# Patient Record
Sex: Male | Born: 1940
Health system: Southern US, Community
[De-identification: ages and names within clinical notes are randomized; demographics above are authoritative.]

## PROBLEM LIST (undated history)

## (undated) DIAGNOSIS — D649 Anemia, unspecified: Secondary | ICD-10-CM

## (undated) DIAGNOSIS — Z8679 Personal history of other diseases of the circulatory system: Secondary | ICD-10-CM

## (undated) DIAGNOSIS — E119 Type 2 diabetes mellitus without complications: Secondary | ICD-10-CM

## (undated) DIAGNOSIS — Z8719 Personal history of other diseases of the digestive system: Secondary | ICD-10-CM

## (undated) DIAGNOSIS — I10 Essential (primary) hypertension: Secondary | ICD-10-CM

## (undated) HISTORY — PX: TESTICLE SURGERY: SHX794

## (undated) HISTORY — PX: TONSILLECTOMY: SUR1361

---

## 2002-05-28 ENCOUNTER — Emergency Department (HOSPITAL_COMMUNITY): Admission: EM | Admit: 2002-05-28 | Discharge: 2002-05-28 | Payer: Self-pay | Admitting: *Deleted

## 2002-05-28 ENCOUNTER — Encounter: Payer: Self-pay | Admitting: *Deleted

## 2004-01-24 ENCOUNTER — Emergency Department (HOSPITAL_COMMUNITY): Admission: EM | Admit: 2004-01-24 | Discharge: 2004-01-25 | Payer: Self-pay | Admitting: Emergency Medicine

## 2009-02-04 ENCOUNTER — Emergency Department (HOSPITAL_COMMUNITY): Admission: EM | Admit: 2009-02-04 | Discharge: 2009-02-04 | Payer: Self-pay | Admitting: Emergency Medicine

## 2011-03-05 DIAGNOSIS — I10 Essential (primary) hypertension: Secondary | ICD-10-CM | POA: Diagnosis not present

## 2011-03-05 DIAGNOSIS — I679 Cerebrovascular disease, unspecified: Secondary | ICD-10-CM | POA: Diagnosis not present

## 2011-03-05 DIAGNOSIS — R5381 Other malaise: Secondary | ICD-10-CM | POA: Diagnosis not present

## 2011-05-05 DIAGNOSIS — R5383 Other fatigue: Secondary | ICD-10-CM | POA: Diagnosis not present

## 2011-05-05 DIAGNOSIS — I679 Cerebrovascular disease, unspecified: Secondary | ICD-10-CM | POA: Diagnosis not present

## 2011-05-05 DIAGNOSIS — I10 Essential (primary) hypertension: Secondary | ICD-10-CM | POA: Diagnosis not present

## 2011-05-05 DIAGNOSIS — R5381 Other malaise: Secondary | ICD-10-CM | POA: Diagnosis not present

## 2011-07-29 DIAGNOSIS — R5383 Other fatigue: Secondary | ICD-10-CM | POA: Diagnosis not present

## 2011-07-29 DIAGNOSIS — I679 Cerebrovascular disease, unspecified: Secondary | ICD-10-CM | POA: Diagnosis not present

## 2011-07-29 DIAGNOSIS — R5381 Other malaise: Secondary | ICD-10-CM | POA: Diagnosis not present

## 2011-07-29 DIAGNOSIS — I10 Essential (primary) hypertension: Secondary | ICD-10-CM | POA: Diagnosis not present

## 2011-12-29 DIAGNOSIS — S335XXA Sprain of ligaments of lumbar spine, initial encounter: Secondary | ICD-10-CM | POA: Diagnosis not present

## 2011-12-29 DIAGNOSIS — I10 Essential (primary) hypertension: Secondary | ICD-10-CM | POA: Diagnosis not present

## 2011-12-29 DIAGNOSIS — I679 Cerebrovascular disease, unspecified: Secondary | ICD-10-CM | POA: Diagnosis not present

## 2012-07-26 DIAGNOSIS — I679 Cerebrovascular disease, unspecified: Secondary | ICD-10-CM | POA: Diagnosis not present

## 2012-07-26 DIAGNOSIS — I10 Essential (primary) hypertension: Secondary | ICD-10-CM | POA: Diagnosis not present

## 2012-11-10 DIAGNOSIS — I679 Cerebrovascular disease, unspecified: Secondary | ICD-10-CM | POA: Diagnosis not present

## 2012-11-10 DIAGNOSIS — I10 Essential (primary) hypertension: Secondary | ICD-10-CM | POA: Diagnosis not present

## 2012-11-29 DIAGNOSIS — I10 Essential (primary) hypertension: Secondary | ICD-10-CM | POA: Diagnosis not present

## 2012-12-04 ENCOUNTER — Emergency Department (HOSPITAL_COMMUNITY): Payer: Medicare Other

## 2012-12-04 ENCOUNTER — Emergency Department (HOSPITAL_COMMUNITY)
Admission: EM | Admit: 2012-12-04 | Discharge: 2012-12-04 | Disposition: A | Discharge status: Home / Self Care | Payer: Medicare Other | Attending: Emergency Medicine | Admitting: Emergency Medicine

## 2012-12-04 ENCOUNTER — Encounter (HOSPITAL_COMMUNITY): Payer: Self-pay | Admitting: Emergency Medicine

## 2012-12-04 DIAGNOSIS — Z79899 Other long term (current) drug therapy: Secondary | ICD-10-CM | POA: Insufficient documentation

## 2012-12-04 DIAGNOSIS — T6091XA Toxic effect of unspecified pesticide, accidental (unintentional), initial encounter: Secondary | ICD-10-CM | POA: Diagnosis not present

## 2012-12-04 DIAGNOSIS — R0602 Shortness of breath: Secondary | ICD-10-CM | POA: Diagnosis not present

## 2012-12-04 DIAGNOSIS — J9801 Acute bronchospasm: Secondary | ICD-10-CM | POA: Insufficient documentation

## 2012-12-04 DIAGNOSIS — Z7982 Long term (current) use of aspirin: Secondary | ICD-10-CM | POA: Insufficient documentation

## 2012-12-04 DIAGNOSIS — T5994XA Toxic effect of unspecified gases, fumes and vapors, undetermined, initial encounter: Secondary | ICD-10-CM

## 2012-12-04 DIAGNOSIS — Y9389 Activity, other specified: Secondary | ICD-10-CM | POA: Insufficient documentation

## 2012-12-04 DIAGNOSIS — I10 Essential (primary) hypertension: Secondary | ICD-10-CM | POA: Insufficient documentation

## 2012-12-04 DIAGNOSIS — R61 Generalized hyperhidrosis: Secondary | ICD-10-CM | POA: Diagnosis not present

## 2012-12-04 DIAGNOSIS — T59891A Toxic effect of other specified gases, fumes and vapors, accidental (unintentional), initial encounter: Secondary | ICD-10-CM | POA: Diagnosis not present

## 2012-12-04 DIAGNOSIS — Y92009 Unspecified place in unspecified non-institutional (private) residence as the place of occurrence of the external cause: Secondary | ICD-10-CM | POA: Insufficient documentation

## 2012-12-04 HISTORY — DX: Essential (primary) hypertension: I10

## 2012-12-04 MED ORDER — ALBUTEROL SULFATE HFA 108 (90 BASE) MCG/ACT IN AERS: 2.0000 | INHALATION_SPRAY | RESPIRATORY_TRACT | Status: DC | PRN

## 2012-12-04 MED ORDER — ALBUTEROL SULFATE (5 MG/ML) 0.5% IN NEBU
2.5000 mg | INHALATION_SOLUTION | Freq: Once | RESPIRATORY_TRACT | Status: AC
  Administered 2012-12-04: 2.5 mg via RESPIRATORY_TRACT
  Filled 2012-12-04: qty 0.5

## 2012-12-04 MED ORDER — ALBUTEROL SULFATE HFA 108 (90 BASE) MCG/ACT IN AERS
INHALATION_SPRAY | RESPIRATORY_TRACT | Status: AC
  Filled 2012-12-04: qty 6.7

## 2012-12-04 MED ORDER — AEROCHAMBER Z-STAT PLUS/MEDIUM MISC: 1.0000 | Freq: Once | Status: DC

## 2012-12-04 MED ORDER — IPRATROPIUM BROMIDE 0.02 % IN SOLN
0.5000 mg | Freq: Once | RESPIRATORY_TRACT | Status: AC
  Administered 2012-12-04: 0.5 mg via RESPIRATORY_TRACT
  Filled 2012-12-04: qty 2.5

## 2012-12-04 NOTE — ED Provider Notes (Signed)
CSN: [id number]     Arrival date & time 12/04/12  1426 History  This chart was scribed for Ward Givens, MD by Leone Payor, ED Scribe. This patient was seen in room APA02/APA02 and the patient's care was started 2:33 PM.    Chief Complaint  Patient presents with  . Shortness of Breath    The history is provided by the patient. No language interpreter was used.    HPI Comments: Andrew Humphrey is a 72 y.o. male brought in by ambulance, who presents to the Emergency Department complaining of gradually improving SOB that began PTA. Pt states he set off 4 bug defoggers in his home around 11 am. He returned after 2 hours and was inside about 30-40 minutes before he began to have severe SOB. Pt states he was diaphoretic as well. Pt was given 3 albuterol treatments, 125 solumedrol, and 50 mg benadryl by EMS. He denies cough, dizziness, lightheadedness. He states he was so SOB he couldn't cough. EMS report they had to get out of the trailer shortly after they got there because the fumes were affecting them also. The only medication he takes is metoprolol. He has never used inhalers in the past.   PCP PA Noralyn Pick at [facility] in [place].   Past Medical History  Diagnosis Date  . Hypertension    History reviewed. No pertinent past surgical history. History reviewed. No pertinent family history. History  Substance Use Topics  . Smoking status: Never Smoker   . Smokeless tobacco: Not on file  . 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 Negative for cough.   Neurological: Negative for dizziness and light-headedness.  All other systems reviewed and are negative.    Allergies  Review of patient's allergies indicates no known allergies.  Home Medications   Current Outpatient Rx  Name  Route  Sig  Dispense  Refill  . aspirin EC 81 MG tablet   Oral   Take 81 mg by mouth  daily.         Marland Kitchen lisinopril-hydrochlorothiazide (PRINZIDE,ZESTORETIC) 10-12.5 MG per tablet   Oral   Take 1 tablet by mouth daily.         . metoprolol succinate (TOPROL-XL) 50 MG 24 hr tablet   Oral   Take 50 mg by mouth daily.         . Omega-3 Fatty Acids (FISH OIL) 1000 MG CAPS   Oral   Take 1,000 mg by mouth daily.          BP 151/87  Pulse 96  Temp(Src) 98.1 F (36.7 C) (Oral)  Resp 18  Ht 5\' 7"  (1.702 m)  Wt 202 lb (91.627 kg)  BMI 31.63 kg/m2  SpO2 97%  Vital signs normal   Physical Exam  Nursing note and vitals reviewed. Constitutional: He is oriented to person, place, and time. He appears well-developed and well-nourished.  Non-toxic appearance. He does not appear ill. No distress.  HENT:  Head: Normocephalic and atraumatic.  Right Ear: External ear normal.  Left Ear: External ear normal.  Nose: Nose normal. No mucosal edema or rhinorrhea.  Mouth/Throat: Oropharynx is clear and moist and mucous membranes are normal. No dental abscesses or uvula swelling.  Eyes: Conjunctivae and EOM are normal. Pupils are equal, round, and reactive to light.  Neck: Normal range of motion and full passive range of motion without  pain. Neck supple.  Cardiovascular: Normal rate, regular rhythm and normal heart sounds.  Exam reveals no gallop and no friction rub.   No murmur heard. Pulmonary/Chest: Effort normal. No respiratory distress. He has wheezes ( Expiratory ). He has no rhonchi. He has no rales. He exhibits no tenderness and no crepitus.  Mild retractions and mild abdominal breathing.   Abdominal: Soft. Normal appearance and bowel sounds are normal. He exhibits no distension. There is no tenderness. There is no rebound and no guarding.  Musculoskeletal: Normal range of motion. He exhibits no edema and no tenderness.  Moves all extremities well.   Neurological: He is alert and oriented to person, place, and time. He has normal strength. No cranial nerve deficit.   Skin: Skin is warm, dry and intact. No rash noted. No erythema. No pallor.  Psychiatric: He has a normal mood and affect. His speech is normal and behavior is normal. His mood appears not anxious.    ED Course  Procedures  Medications  albuterol (PROVENTIL HFA;VENTOLIN HFA) 108 (90 BASE) MCG/ACT inhaler 2 puff (not administered)  aerochamber Z-Stat Plus/medium 1 each (not administered)  albuterol (PROVENTIL HFA;VENTOLIN HFA) 108 (90 BASE) MCG/ACT inhaler (not administered)  albuterol (PROVENTIL) (5 MG/ML) 0.5% nebulizer solution 2.5 mg (2.5 mg Nebulization Given 12/04/12 1512)  ipratropium (ATROVENT) nebulizer solution 0.5 mg (0.5 mg Nebulization Given 12/04/12 1512)     DIAGNOSTIC STUDIES: Oxygen Saturation is 98% on RA, normal by my interpretation.    COORDINATION OF CARE: 2:35 PM Discussed treatment plan with pt at bedside and pt agreed to plan.    3:50 PM Upon recheck, pt's lungs sound clear.   Pt ambulated by nursing staff and had consistent pulse ox of 96-97 % on RA without getting SOB.   Imaging Review Dg Chest 2 View  12/04/2012   CLINICAL DATA:  Shortness of breath.  EXAM: CHEST  2 VIEW  COMPARISON:  No priors.  FINDINGS: Lung volumes are normal. No consolidative airspace disease. No pleural effusions. No pneumothorax. No pulmonary nodule or mass noted. Pulmonary vasculature and the cardiomediastinal silhouette are within normal limits.  IMPRESSION: 1.  No radiographic evidence of acute cardiopulmonary disease.   Electronically Signed   By: Trudie Reed M.D.   On: 12/04/2012 15:14      MDM  patient had toxic exposure to fall getting chemicals. He has improved with nebulizers and getting out of that environment. He has no evidence of a chemical pneumonia on his chest x-ray and his breathing improved.    1. Toxic inhalation injury, initial encounter   2. Bronchospasm      Plan discharge   Devoria Albe, MD, FACEP   I personally performed the services described  in this documentation, which was scribed in my presence. The recorded information has been reviewed and considered.  Devoria Albe, MD, Armando Gang   Ward Givens, MD 12/04/12 [phone]

## 2012-12-04 NOTE — ED Notes (Addendum)
Per EMS, pt set off bug defoggers this afternoon inside of pt trailer. Pt came back to house two hours later and sob began. Per EMS, pt was having respiratory distress. Pt received 50 mg of benadryl,3 albuterol treatments,125 mg solu-medrol en route. Pt o2 sat 98% on room air at time of arrival to ED. Mild dyspnea noted,pt voice hoarse. Pt alert and oriented. nad noted. Airway patent. Mm moist intact.

## 2013-03-28 DIAGNOSIS — I10 Essential (primary) hypertension: Secondary | ICD-10-CM | POA: Diagnosis not present

## 2013-05-11 DIAGNOSIS — J019 Acute sinusitis, unspecified: Secondary | ICD-10-CM | POA: Diagnosis not present

## 2013-05-11 DIAGNOSIS — R197 Diarrhea, unspecified: Secondary | ICD-10-CM | POA: Diagnosis not present

## 2013-05-11 DIAGNOSIS — R059 Cough, unspecified: Secondary | ICD-10-CM | POA: Diagnosis not present

## 2013-05-11 DIAGNOSIS — R05 Cough: Secondary | ICD-10-CM | POA: Diagnosis not present

## 2013-07-26 DIAGNOSIS — I10 Essential (primary) hypertension: Secondary | ICD-10-CM | POA: Diagnosis not present

## 2013-07-26 DIAGNOSIS — I679 Cerebrovascular disease, unspecified: Secondary | ICD-10-CM | POA: Diagnosis not present

## 2013-11-23 DIAGNOSIS — I10 Essential (primary) hypertension: Secondary | ICD-10-CM | POA: Diagnosis not present

## 2013-11-23 DIAGNOSIS — I679 Cerebrovascular disease, unspecified: Secondary | ICD-10-CM | POA: Diagnosis not present

## 2014-01-30 DIAGNOSIS — Z1389 Encounter for screening for other disorder: Secondary | ICD-10-CM | POA: Diagnosis not present

## 2014-01-30 DIAGNOSIS — I10 Essential (primary) hypertension: Secondary | ICD-10-CM | POA: Diagnosis not present

## 2014-01-30 DIAGNOSIS — I679 Cerebrovascular disease, unspecified: Secondary | ICD-10-CM | POA: Diagnosis not present

## 2014-05-02 DIAGNOSIS — I10 Essential (primary) hypertension: Secondary | ICD-10-CM | POA: Diagnosis not present

## 2014-05-02 DIAGNOSIS — I679 Cerebrovascular disease, unspecified: Secondary | ICD-10-CM | POA: Diagnosis not present

## 2014-10-15 ENCOUNTER — Emergency Department (HOSPITAL_COMMUNITY): Payer: Commercial Managed Care - HMO

## 2014-10-15 ENCOUNTER — Emergency Department (HOSPITAL_COMMUNITY)
Admission: EM | Admit: 2014-10-15 | Discharge: 2014-10-15 | Disposition: A | Discharge status: Home / Self Care | Payer: Commercial Managed Care - HMO | Attending: Emergency Medicine | Admitting: Emergency Medicine

## 2014-10-15 ENCOUNTER — Encounter (HOSPITAL_COMMUNITY): Payer: Self-pay | Admitting: Emergency Medicine

## 2014-10-15 DIAGNOSIS — Z7982 Long term (current) use of aspirin: Secondary | ICD-10-CM | POA: Diagnosis not present

## 2014-10-15 DIAGNOSIS — I10 Essential (primary) hypertension: Secondary | ICD-10-CM | POA: Insufficient documentation

## 2014-10-15 DIAGNOSIS — R209 Unspecified disturbances of skin sensation: Secondary | ICD-10-CM | POA: Insufficient documentation

## 2014-10-15 DIAGNOSIS — Z79899 Other long term (current) drug therapy: Secondary | ICD-10-CM | POA: Diagnosis not present

## 2014-10-15 DIAGNOSIS — J302 Other seasonal allergic rhinitis: Secondary | ICD-10-CM | POA: Diagnosis not present

## 2014-10-15 DIAGNOSIS — R202 Paresthesia of skin: Secondary | ICD-10-CM | POA: Diagnosis not present

## 2014-10-15 DIAGNOSIS — R0981 Nasal congestion: Secondary | ICD-10-CM | POA: Diagnosis not present

## 2014-10-15 DIAGNOSIS — R0602 Shortness of breath: Secondary | ICD-10-CM | POA: Diagnosis not present

## 2014-10-15 DIAGNOSIS — J309 Allergic rhinitis, unspecified: Secondary | ICD-10-CM | POA: Diagnosis not present

## 2014-10-15 LAB — CBC WITH DIFFERENTIAL/PLATELET
BASOS PCT: 0 % (ref 0–1)
Basophils Absolute: 0 10*3/uL (ref 0.0–0.1)
EOS ABS: 0.2 10*3/uL (ref 0.0–0.7)
Eosinophils Relative: 1 % (ref 0–5)
HCT: 40.7 % (ref 39.0–52.0)
HEMOGLOBIN: 14 g/dL (ref 13.0–17.0)
Lymphocytes Relative: 21 % (ref 12–46)
Lymphs Abs: 2.3 10*3/uL (ref 0.7–4.0)
MCH: 32.2 pg (ref 26.0–34.0)
MCHC: 34.4 g/dL (ref 30.0–36.0)
MCV: 93.6 fL (ref 78.0–100.0)
Monocytes Absolute: 1.1 10*3/uL — ABNORMAL HIGH (ref 0.1–1.0)
Monocytes Relative: 10 % (ref 3–12)
NEUTROS PCT: 68 % (ref 43–77)
Neutro Abs: 7.6 10*3/uL (ref 1.7–7.7)
PLATELETS: 179 10*3/uL (ref 150–400)
RBC: 4.35 MIL/uL (ref 4.22–5.81)
RDW: 16.6 % — ABNORMAL HIGH (ref 11.5–15.5)
WBC: 11.2 10*3/uL — AB (ref 4.0–10.5)

## 2014-10-15 LAB — BASIC METABOLIC PANEL
Anion gap: 10 (ref 5–15)
BUN: 12 mg/dL (ref 6–20)
CO2: 21 mmol/L — ABNORMAL LOW (ref 22–32)
Calcium: 9.2 mg/dL (ref 8.9–10.3)
Chloride: 107 mmol/L (ref 101–111)
Creatinine, Ser: 0.8 mg/dL (ref 0.61–1.24)
GFR calc Af Amer: >60 mL/min (ref >60)
GFR calc non Af Amer: >60 mL/min (ref >60)
Glucose, Bld: 110 mg/dL — ABNORMAL HIGH (ref 65–99)
Potassium: 3.8 mmol/L (ref 3.5–5.1)
Sodium: 138 mmol/L (ref 135–145)

## 2014-10-15 MED ORDER — LEVOCETIRIZINE DIHYDROCHLORIDE 5 MG PO TABS: 5.0000 mg | ORAL_TABLET | Freq: Every evening | ORAL | Status: DC

## 2014-10-15 MED ORDER — FLUTICASONE PROPIONATE 50 MCG/ACT NA SUSP: 2.0000 | Freq: Every day | NASAL | Status: DC

## 2014-10-15 MED ORDER — CHLORPHEN-PE-ACETAMINOPHEN 2-5-325 MG PO TABS: 1.0000 | ORAL_TABLET | Freq: Two times a day (BID) | ORAL | Status: DC | PRN

## 2014-10-15 NOTE — ED Provider Notes (Signed)
CSN: [id number]     Arrival date & time 10/15/14  1613 History   First MD Initiated Contact with Patient 10/15/14 1649     Chief Complaint  Patient presents with  . Dysphagia  . Shortness of Breath     (Consider location/radiation/quality/duration/timing/severity/associated sxs/prior Treatment) Patient is a 74 y.o. male presenting with pharyngitis. The history is provided by the patient.  Sore Throat This is a new problem. The current episode started more than 2 days ago. The problem occurs constantly. Progression since onset: waxing and waning. Associated symptoms include shortness of breath (feels like throat is tight, increased nasal drainage, mild cough). Pertinent negatives include no chest pain. Exacerbated by: mowing lawn. Nothing relieves the symptoms. He has tried nothing for the symptoms. The treatment provided no relief.    Past Medical History  Diagnosis Date  . Hypertension    History reviewed. No pertinent past surgical history. History reviewed. No pertinent family history. Social History  Substance Use Topics  . Smoking status: Never Smoker   . Smokeless tobacco: None  . Alcohol Use: Yes     Comment: occasional beer    Review of Systems  Respiratory: Positive for shortness of breath (feels like throat is tight, increased nasal drainage, mild cough).   Cardiovascular: Negative for chest pain.  All other systems reviewed and are negative.     Allergies  Review of patient's allergies indicates no known allergies.  Home Medications   Prior to Admission medications   Medication Sig Start Date End Date Taking? Authorizing Provider  albuterol (PROVENTIL HFA;VENTOLIN HFA) 108 (90 BASE) MCG/ACT inhaler Inhale 1-2 puffs into the lungs every 6 (six) hours as needed for wheezing or shortness of breath.   Yes Historical Provider, MD  aspirin EC 81 MG tablet Take 81 mg by mouth every morning.    Yes Historical Provider, MD  metoprolol succinate (TOPROL-XL) 100 MG 24  hr tablet Take 100 mg by mouth every morning. 10/04/14  Yes Historical Provider, MD  Chlorphen-Phenyleph-APAP (CORICIDIN D COLD/FLU/SINUS) 2-5-325 MG TABS Take 1 tablet by mouth 2 (two) times daily as needed (congestion). 10/15/14   Leo Grosser, MD  fluticasone [facility]) 50 MCG/ACT nasal spray Place 2 sprays into both nostrils daily. 10/15/14   Leo Grosser, MD  levocetirizine (XYZAL) 5 MG tablet Take 1 tablet (5 mg total) by mouth every evening. 10/15/14   Leo Grosser, MD   BP 190/108 mmHg  Pulse 69  Temp(Src) 98.1 F (36.7 C) (Oral)  Resp 15  Ht 5\' 7"  (1.702 m)  Wt 190 lb (86.183 kg)  BMI 29.75 kg/m2  SpO2 99% Physical Exam  Constitutional: He is oriented to person, place, and time. He appears well-developed and well-nourished. No distress.  HENT:  Head: Normocephalic and atraumatic.  Nose: Mucosal edema present. No rhinorrhea. Right sinus exhibits no maxillary sinus tenderness and no frontal sinus tenderness. Left sinus exhibits no maxillary sinus tenderness and no frontal sinus tenderness.  Eyes: Conjunctivae are normal.  Neck: Neck supple. No tracheal deviation present.  Cardiovascular: Normal rate and regular rhythm.   Pulmonary/Chest: Effort normal. No respiratory distress.  Abdominal: Soft. He exhibits no distension.  Neurological: He is alert and oriented to person, place, and time. He has normal strength. No cranial nerve deficit or sensory deficit. Coordination normal. GCS eye subscore is 4. GCS verbal subscore is 5. GCS motor subscore is 6.  Skin: Skin is warm and dry.  Psychiatric: He has a normal mood and affect.    ED Course  Procedures (including critical care time) Labs Review Labs Reviewed  BASIC METABOLIC PANEL - Abnormal; Notable for the following:    CO2 21 (*)    Glucose, Bld 110 (*)    All other components within normal limits  CBC WITH DIFFERENTIAL/PLATELET - Abnormal; Notable for the following:    WBC 11.2 (*)    RDW 16.6 (*)    Monocytes Absolute 1.1  (*)    All other components within normal limits    Imaging Review Dg Chest 2 View  10/15/2014   CLINICAL DATA:  Shortness of breath for 7 days. Tingling on the right side of the face and in the arms.  EXAM: CHEST  2 VIEW  COMPARISON:  PA and lateral chest 12/04/2012.  FINDINGS: The lungs are clear. Heart size is normal. No pneumothorax or pleural effusion. No focal bony abnormality. Mild thoracic spondylosis noted  IMPRESSION: No acute disease.   Electronically Signed   By: Inge Rise M.D.   On: 10/15/2014 18:32   Ct Head Wo Contrast  10/15/2014   CLINICAL DATA:  Difficulty swallowing for 1 week, left-sided facial paresthesias  EXAM: CT HEAD WITHOUT CONTRAST  TECHNIQUE: Contiguous axial images were obtained from the base of the skull through the vertex without intravenous contrast.  COMPARISON:  12/05/2009  FINDINGS: Bony calvarium is intact. No findings to suggest acute hemorrhage, acute infarction or space-occupying mass lesion is identified. Chronic opacification of the right maxillary antrum is noted.  IMPRESSION: No acute abnormality noted.   Electronically Signed   By: Inez Catalina M.D.   On: 10/15/2014 18:45   I have personally reviewed and evaluated these images and lab results as part of my medical decision-making.   EKG Interpretation None      MDM   Final diagnoses:  Nasal congestion  Seasonal allergies    74 year old male with past history of CVA presents with left-sided facial tingling, ongoing issues with congestion and some shortness of breath with activity. He notices this mostly when he is mowing the lawn. He has had some mild a productive cough. He is hypertensive on arrival and with his facial paresthesia and history of stroke CT was ordered to evaluate for ischemic insult although I find this unlikely at this time. No fevers or other signs of acute sinusitis. Will treat with supportive care measures for possible environmental allergies.   Leo Grosser,  MD 10/16/14 1311

## 2014-10-15 NOTE — ED Notes (Signed)
Pt alert & oriented x4, stable gait. Patient given discharge instructions, paperwork & prescription(s). Patient  instructed to stop at the registration desk to finish any additional paperwork. Patient verbalized understanding. Pt left department w/ no further questions. 

## 2014-10-15 NOTE — ED Notes (Signed)
Having difficulty swallowing and SOB for last 7 days.  Tingling to left side of face per family.  Pt is very playful.

## 2014-10-15 NOTE — Discharge Instructions (Signed)

## 2015-01-09 DIAGNOSIS — I679 Cerebrovascular disease, unspecified: Secondary | ICD-10-CM | POA: Diagnosis not present

## 2015-01-09 DIAGNOSIS — I10 Essential (primary) hypertension: Secondary | ICD-10-CM | POA: Diagnosis not present

## 2015-05-02 DIAGNOSIS — I10 Essential (primary) hypertension: Secondary | ICD-10-CM | POA: Diagnosis not present

## 2015-05-02 DIAGNOSIS — I679 Cerebrovascular disease, unspecified: Secondary | ICD-10-CM | POA: Diagnosis not present

## 2015-09-26 DIAGNOSIS — R1310 Dysphagia, unspecified: Secondary | ICD-10-CM | POA: Diagnosis not present

## 2015-09-26 DIAGNOSIS — R739 Hyperglycemia, unspecified: Secondary | ICD-10-CM | POA: Diagnosis not present

## 2015-09-26 DIAGNOSIS — K219 Gastro-esophageal reflux disease without esophagitis: Secondary | ICD-10-CM | POA: Diagnosis not present

## 2015-09-26 DIAGNOSIS — R5383 Other fatigue: Secondary | ICD-10-CM | POA: Diagnosis not present

## 2015-09-26 DIAGNOSIS — Z6828 Body mass index (BMI) 28.0-28.9, adult: Secondary | ICD-10-CM | POA: Diagnosis not present

## 2015-09-28 DIAGNOSIS — Z7984 Long term (current) use of oral hypoglycemic drugs: Secondary | ICD-10-CM | POA: Diagnosis not present

## 2015-09-28 DIAGNOSIS — D72829 Elevated white blood cell count, unspecified: Secondary | ICD-10-CM | POA: Diagnosis not present

## 2015-09-28 DIAGNOSIS — I10 Essential (primary) hypertension: Secondary | ICD-10-CM | POA: Diagnosis not present

## 2015-09-28 DIAGNOSIS — Z9181 History of falling: Secondary | ICD-10-CM | POA: Diagnosis not present

## 2015-09-28 DIAGNOSIS — R531 Weakness: Secondary | ICD-10-CM | POA: Diagnosis not present

## 2015-09-28 DIAGNOSIS — Z7982 Long term (current) use of aspirin: Secondary | ICD-10-CM | POA: Diagnosis not present

## 2015-09-28 DIAGNOSIS — E871 Hypo-osmolality and hyponatremia: Secondary | ICD-10-CM | POA: Diagnosis not present

## 2015-09-28 DIAGNOSIS — K219 Gastro-esophageal reflux disease without esophagitis: Secondary | ICD-10-CM | POA: Diagnosis not present

## 2015-09-28 DIAGNOSIS — E1165 Type 2 diabetes mellitus with hyperglycemia: Secondary | ICD-10-CM | POA: Diagnosis not present

## 2015-09-28 DIAGNOSIS — Z8673 Personal history of transient ischemic attack (TIA), and cerebral infarction without residual deficits: Secondary | ICD-10-CM | POA: Diagnosis not present

## 2015-10-17 DIAGNOSIS — E1165 Type 2 diabetes mellitus with hyperglycemia: Secondary | ICD-10-CM | POA: Diagnosis not present

## 2015-10-17 DIAGNOSIS — Z6829 Body mass index (BMI) 29.0-29.9, adult: Secondary | ICD-10-CM | POA: Diagnosis not present

## 2015-10-17 DIAGNOSIS — I10 Essential (primary) hypertension: Secondary | ICD-10-CM | POA: Diagnosis not present

## 2015-11-02 DIAGNOSIS — K219 Gastro-esophageal reflux disease without esophagitis: Secondary | ICD-10-CM | POA: Diagnosis not present

## 2015-11-02 DIAGNOSIS — I10 Essential (primary) hypertension: Secondary | ICD-10-CM | POA: Diagnosis not present

## 2015-11-02 DIAGNOSIS — E1165 Type 2 diabetes mellitus with hyperglycemia: Secondary | ICD-10-CM | POA: Diagnosis not present

## 2015-11-07 DIAGNOSIS — I10 Essential (primary) hypertension: Secondary | ICD-10-CM | POA: Diagnosis not present

## 2015-11-07 DIAGNOSIS — E1165 Type 2 diabetes mellitus with hyperglycemia: Secondary | ICD-10-CM | POA: Diagnosis not present

## 2015-11-07 DIAGNOSIS — Z6829 Body mass index (BMI) 29.0-29.9, adult: Secondary | ICD-10-CM | POA: Diagnosis not present

## 2016-02-01 DIAGNOSIS — K219 Gastro-esophageal reflux disease without esophagitis: Secondary | ICD-10-CM | POA: Diagnosis not present

## 2016-02-01 DIAGNOSIS — I679 Cerebrovascular disease, unspecified: Secondary | ICD-10-CM | POA: Diagnosis not present

## 2016-02-01 DIAGNOSIS — I10 Essential (primary) hypertension: Secondary | ICD-10-CM | POA: Diagnosis not present

## 2016-02-01 DIAGNOSIS — E1165 Type 2 diabetes mellitus with hyperglycemia: Secondary | ICD-10-CM | POA: Diagnosis not present

## 2016-02-07 DIAGNOSIS — I10 Essential (primary) hypertension: Secondary | ICD-10-CM | POA: Diagnosis not present

## 2016-02-07 DIAGNOSIS — E1165 Type 2 diabetes mellitus with hyperglycemia: Secondary | ICD-10-CM | POA: Diagnosis not present

## 2016-02-07 DIAGNOSIS — Z6829 Body mass index (BMI) 29.0-29.9, adult: Secondary | ICD-10-CM | POA: Diagnosis not present

## 2016-06-03 DIAGNOSIS — I10 Essential (primary) hypertension: Secondary | ICD-10-CM | POA: Diagnosis not present

## 2016-06-03 DIAGNOSIS — E1165 Type 2 diabetes mellitus with hyperglycemia: Secondary | ICD-10-CM | POA: Diagnosis not present

## 2016-06-05 DIAGNOSIS — E1165 Type 2 diabetes mellitus with hyperglycemia: Secondary | ICD-10-CM | POA: Diagnosis not present

## 2016-06-05 DIAGNOSIS — I10 Essential (primary) hypertension: Secondary | ICD-10-CM | POA: Diagnosis not present

## 2016-06-05 DIAGNOSIS — Z6829 Body mass index (BMI) 29.0-29.9, adult: Secondary | ICD-10-CM | POA: Diagnosis not present

## 2016-10-03 DIAGNOSIS — I679 Cerebrovascular disease, unspecified: Secondary | ICD-10-CM | POA: Diagnosis not present

## 2016-10-03 DIAGNOSIS — R739 Hyperglycemia, unspecified: Secondary | ICD-10-CM | POA: Diagnosis not present

## 2016-10-03 DIAGNOSIS — R5383 Other fatigue: Secondary | ICD-10-CM | POA: Diagnosis not present

## 2016-10-03 DIAGNOSIS — K219 Gastro-esophageal reflux disease without esophagitis: Secondary | ICD-10-CM | POA: Diagnosis not present

## 2016-10-03 DIAGNOSIS — I10 Essential (primary) hypertension: Secondary | ICD-10-CM | POA: Diagnosis not present

## 2016-10-03 DIAGNOSIS — E1165 Type 2 diabetes mellitus with hyperglycemia: Secondary | ICD-10-CM | POA: Diagnosis not present

## 2016-10-06 DIAGNOSIS — I10 Essential (primary) hypertension: Secondary | ICD-10-CM | POA: Diagnosis not present

## 2016-10-06 DIAGNOSIS — E1165 Type 2 diabetes mellitus with hyperglycemia: Secondary | ICD-10-CM | POA: Diagnosis not present

## 2016-10-06 DIAGNOSIS — Z6830 Body mass index (BMI) 30.0-30.9, adult: Secondary | ICD-10-CM | POA: Diagnosis not present

## 2016-11-08 ENCOUNTER — Encounter (HOSPITAL_COMMUNITY): Payer: Self-pay | Admitting: Cardiology

## 2016-11-08 ENCOUNTER — Inpatient Hospital Stay (HOSPITAL_COMMUNITY)
Admission: EM | Admit: 2016-11-08 | Discharge: 2016-11-09 | DRG: 378 | Disposition: A | Discharge status: Home / Self Care | Payer: Medicare Other | Attending: Internal Medicine | Admitting: Internal Medicine

## 2016-11-08 DIAGNOSIS — K921 Melena: Secondary | ICD-10-CM

## 2016-11-08 DIAGNOSIS — Z8719 Personal history of other diseases of the digestive system: Secondary | ICD-10-CM

## 2016-11-08 DIAGNOSIS — D62 Acute posthemorrhagic anemia: Secondary | ICD-10-CM | POA: Diagnosis not present

## 2016-11-08 DIAGNOSIS — Z79899 Other long term (current) drug therapy: Secondary | ICD-10-CM

## 2016-11-08 DIAGNOSIS — D122 Benign neoplasm of ascending colon: Secondary | ICD-10-CM | POA: Diagnosis present

## 2016-11-08 DIAGNOSIS — E119 Type 2 diabetes mellitus without complications: Secondary | ICD-10-CM | POA: Diagnosis present

## 2016-11-08 DIAGNOSIS — K644 Residual hemorrhoidal skin tags: Secondary | ICD-10-CM | POA: Diagnosis not present

## 2016-11-08 DIAGNOSIS — D123 Benign neoplasm of transverse colon: Secondary | ICD-10-CM | POA: Diagnosis present

## 2016-11-08 DIAGNOSIS — Z7982 Long term (current) use of aspirin: Secondary | ICD-10-CM | POA: Diagnosis not present

## 2016-11-08 DIAGNOSIS — D120 Benign neoplasm of cecum: Secondary | ICD-10-CM | POA: Diagnosis not present

## 2016-11-08 DIAGNOSIS — D124 Benign neoplasm of descending colon: Secondary | ICD-10-CM | POA: Diagnosis present

## 2016-11-08 DIAGNOSIS — I10 Essential (primary) hypertension: Secondary | ICD-10-CM | POA: Diagnosis present

## 2016-11-08 DIAGNOSIS — K625 Hemorrhage of anus and rectum: Secondary | ICD-10-CM | POA: Insufficient documentation

## 2016-11-08 DIAGNOSIS — K5731 Diverticulosis of large intestine without perforation or abscess with bleeding: Secondary | ICD-10-CM | POA: Diagnosis not present

## 2016-11-08 HISTORY — DX: Type 2 diabetes mellitus without complications: E11.9

## 2016-11-08 LAB — MRSA PCR SCREENING: MRSA by PCR: NEGATIVE

## 2016-11-08 LAB — COMPREHENSIVE METABOLIC PANEL
ALT: 28 U/L (ref 17–63)
AST: 27 U/L (ref 15–41)
Albumin: 3.7 g/dL (ref 3.5–5.0)
Alkaline Phosphatase: 43 U/L (ref 38–126)
Anion gap: 8 (ref 5–15)
BUN: 17 mg/dL (ref 6–20)
CO2: 24 mmol/L (ref 22–32)
Calcium: 8.4 mg/dL — ABNORMAL LOW (ref 8.9–10.3)
Chloride: 104 mmol/L (ref 101–111)
Creatinine, Ser: 0.8 mg/dL (ref 0.61–1.24)
GFR calc Af Amer: >60 mL/min (ref >60)
GFR calc non Af Amer: >60 mL/min (ref >60)
Glucose, Bld: 186 mg/dL — ABNORMAL HIGH (ref 65–99)
Potassium: 4 mmol/L (ref 3.5–5.1)
Sodium: 136 mmol/L (ref 135–145)
Total Bilirubin: 0.6 mg/dL (ref 0.3–1.2)
Total Protein: 7.1 g/dL (ref 6.5–8.1)

## 2016-11-08 LAB — LIPASE, BLOOD: Lipase: 26 U/L (ref 11–51)

## 2016-11-08 LAB — CBC WITH DIFFERENTIAL/PLATELET
Basophils Absolute: 0 10*3/uL (ref 0.0–0.1)
Basophils Relative: 0 %
Eosinophils Absolute: 0.1 10*3/uL (ref 0.0–0.7)
Eosinophils Relative: 1 %
HCT: 32.7 % — ABNORMAL LOW (ref 39.0–52.0)
Hemoglobin: 11.2 g/dL — ABNORMAL LOW (ref 13.0–17.0)
Lymphocytes Relative: 12 %
Lymphs Abs: 1.5 10*3/uL (ref 0.7–4.0)
MCH: 32.8 pg (ref 26.0–34.0)
MCHC: 34.3 g/dL (ref 30.0–36.0)
MCV: 95.9 fL (ref 78.0–100.0)
Monocytes Absolute: 1 10*3/uL (ref 0.1–1.0)
Monocytes Relative: 8 %
Neutro Abs: 9.9 10*3/uL — ABNORMAL HIGH (ref 1.7–7.7)
Neutrophils Relative %: 79 %
Platelets: ADEQUATE 10*3/uL (ref 150–400)
RBC: 3.41 MIL/uL — ABNORMAL LOW (ref 4.22–5.81)
RDW: 18.1 % — ABNORMAL HIGH (ref 11.5–15.5)
WBC: 12.5 10*3/uL — ABNORMAL HIGH (ref 4.0–10.5)

## 2016-11-08 LAB — CBC
HCT: 32 % — ABNORMAL LOW (ref 39.0–52.0)
HEMATOCRIT: 33.3 % — AB (ref 39.0–52.0)
Hemoglobin: 11.2 g/dL — ABNORMAL LOW (ref 13.0–17.0)
Hemoglobin: 10.9 g/dL — ABNORMAL LOW (ref 13.0–17.0)
MCH: 32.3 pg (ref 26.0–34.0)
MCH: 32.8 pg (ref 26.0–34.0)
MCHC: 33.6 g/dL (ref 30.0–36.0)
MCHC: 34.1 g/dL (ref 30.0–36.0)
MCV: 96 fL (ref 78.0–100.0)
MCV: 96.4 fL (ref 78.0–100.0)
PLATELETS: 169 10*3/uL (ref 150–400)
Platelets: 175 10*3/uL (ref 150–400)
RBC: 3.47 MIL/uL — ABNORMAL LOW (ref 4.22–5.81)
RBC: 3.32 MIL/uL — ABNORMAL LOW (ref 4.22–5.81)
RDW: 18 % — AB (ref 11.5–15.5)
RDW: 18.2 % — AB (ref 11.5–15.5)
WBC: 15.3 10*3/uL — ABNORMAL HIGH (ref 4.0–10.5)
WBC: 16.1 10*3/uL — ABNORMAL HIGH (ref 4.0–10.5)

## 2016-11-08 LAB — PROTIME-INR
INR: 1.12
Prothrombin Time: 14.3 seconds (ref 11.4–15.2)

## 2016-11-08 LAB — GLUCOSE, CAPILLARY
GLUCOSE-CAPILLARY: 94 mg/dL (ref 65–99)
Glucose-Capillary: 121 mg/dL — ABNORMAL HIGH (ref 65–99)

## 2016-11-08 LAB — HEMOGLOBIN A1C
Hgb A1c MFr Bld: 6.4 % — ABNORMAL HIGH (ref 4.8–5.6)
Mean Plasma Glucose: 136.98 mg/dL

## 2016-11-08 LAB — POC OCCULT BLOOD, ED: Fecal Occult Bld: POSITIVE — AB

## 2016-11-08 MED ORDER — DEXTROSE-NACL 5-0.9 % IV SOLN
INTRAVENOUS | Status: DC
  Administered 2016-11-09: 03:00:00 via INTRAVENOUS

## 2016-11-08 MED ORDER — HYDRALAZINE HCL 20 MG/ML IJ SOLN: 5.0000 mg | Freq: Four times a day (QID) | INTRAMUSCULAR | Status: DC | PRN

## 2016-11-08 MED ORDER — SODIUM CHLORIDE 0.9 % IV SOLN
8.0000 mg/h | INTRAVENOUS | Status: DC
  Administered 2016-11-08: 8 mg/h via INTRAVENOUS
  Filled 2016-11-08: qty 80

## 2016-11-08 MED ORDER — PANTOPRAZOLE SODIUM 40 MG IV SOLR
80.0000 mg | Freq: Once | INTRAVENOUS | Status: DC
  Filled 2016-11-08: qty 80

## 2016-11-08 MED ORDER — ONDANSETRON HCL 4 MG/2ML IJ SOLN
4.0000 mg | Freq: Once | INTRAMUSCULAR | Status: AC
  Administered 2016-11-08: 4 mg via INTRAVENOUS

## 2016-11-08 MED ORDER — ONDANSETRON HCL 4 MG/2ML IJ SOLN
4.0000 mg | Freq: Four times a day (QID) | INTRAMUSCULAR | Status: DC | PRN
  Filled 2016-11-08: qty 2

## 2016-11-08 MED ORDER — LEVALBUTEROL HCL 0.63 MG/3ML IN NEBU: 0.6300 mg | INHALATION_SOLUTION | Freq: Four times a day (QID) | RESPIRATORY_TRACT | Status: DC | PRN

## 2016-11-08 MED ORDER — INSULIN ASPART 100 UNIT/ML ~~LOC~~ SOLN
0.0000 [IU] | Freq: Three times a day (TID) | SUBCUTANEOUS | Status: DC
  Administered 2016-11-09: 1 [IU] via SUBCUTANEOUS

## 2016-11-08 MED ORDER — ONDANSETRON HCL 4 MG PO TABS: 4.0000 mg | ORAL_TABLET | Freq: Four times a day (QID) | ORAL | Status: DC | PRN

## 2016-11-08 MED ORDER — BISACODYL 5 MG PO TBEC
10.0000 mg | DELAYED_RELEASE_TABLET | Freq: Four times a day (QID) | ORAL | Status: AC
  Administered 2016-11-08 (×2): 10 mg via ORAL
  Filled 2016-11-08 (×2): qty 2

## 2016-11-08 MED ORDER — ACETAMINOPHEN 325 MG PO TABS: 650.0000 mg | ORAL_TABLET | Freq: Four times a day (QID) | ORAL | Status: DC | PRN

## 2016-11-08 MED ORDER — PANTOPRAZOLE SODIUM 40 MG IV SOLR
8.0000 mg/h | INTRAVENOUS | Status: DC
  Administered 2016-11-08: 8 mg/h via INTRAVENOUS
  Filled 2016-11-08 (×5): qty 80

## 2016-11-08 MED ORDER — SODIUM CHLORIDE 0.9 % IV BOLUS (SEPSIS)
500.0000 mL | Freq: Once | INTRAVENOUS | Status: AC
  Administered 2016-11-08: 500 mL via INTRAVENOUS

## 2016-11-08 MED ORDER — PANTOPRAZOLE SODIUM 40 MG IV SOLR
80.0000 mg | Freq: Once | INTRAVENOUS | Status: AC
  Administered 2016-11-08: 14:00:00 80 mg via INTRAVENOUS
  Filled 2016-11-08: qty 80

## 2016-11-08 MED ORDER — ACETAMINOPHEN 650 MG RE SUPP: 650.0000 mg | Freq: Four times a day (QID) | RECTAL | Status: DC | PRN

## 2016-11-08 MED ORDER — PEG 3350-KCL-NA BICARB-NACL 420 G PO SOLR
4000.0000 mL | Freq: Once | ORAL | Status: AC
  Administered 2016-11-08: 4000 mL via ORAL
  Filled 2016-11-08 (×2): qty 4000

## 2016-11-08 NOTE — Consult Note (Addendum)
Referring Provider: No ref. provider found Primary Care Physician:  Lanelle Bal, PA-C Primary Gastroenterologist:  Barney Drain  Reason for Consultation:  RECTAL BLEEDING   Impression: ADMITTED WITH PAINLESS RECTAL BLEEDING. DIFFERENTIAL DIAGNOSIS INCLUDES DIVERTICULAR LGIB, HEMORRHOIDS, COLON POLYPS, AVMs, & LESS LIKELY COLON CA OR LARGE VOLUME UGIB.   Plan: 1. CLEAR LIQUIDS TODAY. NPO AFTER MN EXCEPT CE CHIPS/SIP WITH MEDS. 2. BOWEL PREP TODAY. TAP WATER ENEMAS IN AM. 3. COLONOSCOPY/POSSIBLE HEMORRHOID BANDING IN 9 AM. DISCUSSED PROCEDURE, BENEFITS, & RISKS: < 1% chance of medication reaction, bleeding, perforation, or rupture of spleen/liver. 4. D/C PROTONIX DRIP ONCE INFUSION IS COMPLETE.  GREATER THAN 50% WAS SPENT IN COUNSELING & COORDINATION OF CARE WITH THE PATIENT AND DAUGHTER VIA PHONE: DISCUSSED DIFFERENTIAL DIAGNOSIS, PROCEDURE, BENEFITS, RISKS, AND MANAGEMENT OF PAINLESS RECTAL BLEEDING/COLONOSCOPY. TOTAL ENCOUNTER TIME: 19 MINS.        HPI:  In usual [place] TO PASS GAS. WHEN HE DID IT FELT WET AND THEN IT WENT DOWN HIS LEG. HAPPENED @9  PM LAST NIGHT. NO RECTAL BLEEDING SINCE 0830 TODAY. BMS: USU.1-2X/DAY. DIANE BROWN HELPS HIM(GIRLFRIEND). DAUGHTER WORKS AT [employer]. ASA DAILY. TOOK ONE REGULAR ASA THIS AM. MAY HAVE HAD A STROKE IN THE PAST. RARE IBUPROFEN. OCCASIONAL BEER. NO BC/GOODY POWDERS, OR NAPROXEN/ALEVE. PT UNABLE TO QUANTIFY #TIMES-RECORDS SHOW 6-8 TIMES SINCE 9 PM YESTERDAY.  PT DENIES FEVER, CHILLS, nausea, vomiting, melena, diarrhea, CHEST PAIN, SHORTNESS OF BREATH, CHANGE IN BOWEL IN HABITS, constipation, abdominal pain, problems swallowing, problems with sedation, OR heartburn or indigestion.  Past Medical History:  Diagnosis Date  . Diabetes mellitus without complication ([place])   . Hypertension    Past Surgical History:  Procedure Laterality Date  . TESTICLE SURGERY     1977  . TONSILLECTOMY      Prior to Admission  medications   Medication Sig Start Date End Date Taking? Authorizing Provider  aspirin 325 MG tablet Take 325 mg x1, WAS 81mg  so he took what he had).   Yes [provider]         guaiFENesin (MUCINEX) 600 MG 12 hr tablet Take 600 mg by mouth 2 (two) times daily as needed for to loosen phlegm.   Yes [provider]  metoprolol succinate (TOPROL-XL) 100 MG 24 hr tablet Take 100 mg by mouth every morning. 10/04/14  Yes [provider]    Current Facility-Administered Medications  Medication Dose Route Frequency Provider Last Rate Last Dose  . acetaminophen (TYLENOL) tablet 650 mg  650 mg Oral Q6H PRN        Or  . acetaminophen (TYLENOL) suppository 650 mg  650 mg Rectal Q6H PRN       . levalbuterol (XOPENEX) nebulizer solution 0.63 mg  0.63 mg Nebulization Q6H PRN       . ondansetron (ZOFRAN) tablet 4 mg  4 mg Oral Q6H PRN        Or  . ondansetron (ZOFRAN) injection 4 mg  4 mg Intravenous Q6H PRN       . pantoprazole (PROTONIX) 80 mg in sodium chloride 0.9 % 250 mL (0.32 mg/mL) infusion  8 mg/hr Intravenous Continuous         Allergies as of 11/08/2016  . (No Known Allergies)   Family History  Problem Relation Age of Onset  . Colon cancer Neg Hx   . Colon polyps Neg Hx     Social History   Social History  . Marital status: Single    Spouse  name: SIGNIFICANT OTHER: DIANE, DAUGHTER: NIKKI  . Number of children: N/A  . Years of education: N/A   Social History Main Topics  . Smoking status: Never Smoker  . Smokeless tobacco: Not on file  . Alcohol use Yes     Comment: occasional beer  . Drug use: No  . Sexual activity: Not on file   Other Topics Concern  . Not on file   Social History Narrative  . 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  Vitals: Blood pressure 131/88, pulse (!) 59, temperature 97.6 F (36.4 C), temperature source Oral, resp. rate 13, height 5\' 7"  (1.702 m),  weight 190 lb (86.2 kg), SpO2 97 %.  Physical Exam: General:   Alert,  Well-developed, well-nourished, pleasant and cooperative in NAD Head:  Normocephalic and atraumatic. Eyes:  Sclera clear, no icterus.   Conjunctiva pink. Mouth:  No lesions, dentition normal. Neck:  Supple; no masses. Lungs:  Clear throughout to auscultation.   No wheezes. No acute distress. Heart:  Regular rate and rhythm; no murmurs. Abdomen:  Soft, nontender and nondistended. No masses noted. Normal bowel sounds, without guarding, and without rebound.   Msk:  Symmetrical without gross deformities.  Extremities:  Without edema. Neurologic:  Alert and  oriented x4;  grossly normal neurologically. Cervical Nodes:  No significant cervical adenopathy. Psych:  Alert and cooperative. Normal mood and affect.   Lab Results:  Recent Labs  11/08/16 1130  WBC 12.5*  HGB 11.2*  HCT 32.7*  PLT PLATELET CLUMPS NOTED ON SMEAR, COUNT APPEARS ADEQUATE   BMET  Recent Labs  11/08/16 1130  NA 136  K 4.0  CL 104  CO2 24  GLUCOSE 186*  BUN 17  CREATININE 0.80  CALCIUM 8.4*   LFT  Recent Labs  11/08/16 1130  PROT 7.1  ALBUMIN 3.7  AST 27  ALT 28  ALKPHOS 43  BILITOT 0.6     Studies/Results: NONE   LOS: 0 days   Talaysha Freeberg  11/08/2016, 2:51 PM

## 2016-11-08 NOTE — ED Notes (Signed)
Carmin Richmond: Daughter: [phone].

## 2016-11-08 NOTE — H&P (Addendum)
[facility] History and Physical  Andrew Humphrey RWE:[id number] DOB: Mar 25, 1940 DOA: 11/08/2016  Referring physician:  PCP: Lanelle Bal, PA-C   Chief Complaint: * Rectal bleeding  HPI:   76 year old male with a history of diabetes, hypertension, presents to the ED with 6-8 episodes of bright red blood per rectum. Described as painless hematochezia. No prior history of bleeding. Patient has not had a colonoscopy. Patient denies any recent weight loss. ED course Blood pressure 147/80 pulse 61 respirations 17 temperature 97.6 Hemoglobin 11.2, platelets 175, sodium 136, creatinine 0.8, FOBT positive      Review of Systems: negative for the following  Constitutional: Denies fever, chills, diaphoresis, appetite change and fatigue.  HEENT: Denies photophobia, eye pain, redness, hearing loss, ear pain, congestion, sore throat, rhinorrhea, sneezing, mouth sores, trouble swallowing, neck pain, neck stiffness and tinnitus.  Respiratory: Denies SOB, DOE, cough, chest tightness, and wheezing.  Cardiovascular: Denies chest pain, palpitations and leg swelling.  Gastrointestinal: Denies nausea, vomiting, abdominal pain, diarrhea, constipation, positive for multiple episodes of bright red blood per rectum Genitourinary: Denies dysuria, urgency, frequency, hematuria, flank pain and difficulty urinating.  Musculoskeletal: Denies myalgias, back pain, joint swelling, arthralgias and gait problem.  Skin: Denies pallor, rash and wound.  Neurological: Denies dizziness, seizures, syncope, weakness, light-headedness, numbness and headaches.  Hematological: Denies adenopathy. Easy bruising, personal or family bleeding history  Psychiatric/Behavioral: Denies suicidal ideation, mood changes, confusion, nervousness, sleep disturbance and agitation       Past Medical History:  Diagnosis Date  . Diabetes mellitus without complication ([place])   . Hypertension      Past Surgical History:  Procedure  Laterality Date  . TESTICLE SURGERY     1977  . TONSILLECTOMY        Social History:  reports that he has never smoked. He does not have any smokeless tobacco history on file. He reports that he drinks alcohol. He reports that he does not use drugs.    No Known Allergies      FAMILY HISTORY  When questioned  Directly-patient reports  No family history of HTN, CVA ,DIABETES, TB, Cancer CAD, Bleeding Disorders, Sickle Cell, diabetes, anemia, asthma,   Prior to Admission medications   Medication Sig Start Date End Date Taking? Authorizing Provider  aspirin 325 MG tablet Take 325 mg by mouth every 6 (six) hours as needed (out of the 81mg  so he took what he had).   Yes [provider]  aspirin EC 81 MG tablet Take 81 mg by mouth every morning.    Yes [provider]  guaiFENesin (MUCINEX) 600 MG 12 hr tablet Take 600 mg by mouth 2 (two) times daily as needed for to loosen phlegm.   Yes [provider]  metoprolol succinate (TOPROL-XL) 100 MG 24 hr tablet Take 100 mg by mouth every morning. 10/04/14  Yes [provider]     Physical Exam: Vitals:   11/08/16 1200 11/08/16 1300 11/08/16 1330 11/08/16 1400  BP: 122/69 130/82 128/73 131/88  Pulse: 61 (!) 56 (!) 56 (!) 59  Resp: 13 13 15 13   Temp:      TempSrc:      SpO2: 98% 96% 97% 97%  Weight:      Height:            Vitals:   11/08/16 1200 11/08/16 1300 11/08/16 1330 11/08/16 1400  BP: 122/69 130/82 128/73 131/88  Pulse: 61 (!) 56 (!) 56 (!) 59  Resp: 13 13 15  13  Temp:      TempSrc:      SpO2: 98% 96% 97% 97%  Weight:      Height:       Constitutional: NAD, calm, comfortable Eyes: PERRL, lids and conjunctivae normal ENMT: Mucous membranes are moist. Posterior pharynx clear of any exudate or lesions.Normal dentition.  Neck: normal, supple, no masses, no thyromegaly Respiratory: clear to auscultation bilaterally, no wheezing, no crackles. Normal respiratory effort. No accessory  muscle use.  Cardiovascular: Regular rate and rhythm, no murmurs / rubs / gallops. No extremity edema. 2+ pedal pulses. No carotid bruits.  Abdomen: no tenderness, no masses palpated. No hepatosplenomegaly. Bowel sounds positive.  Musculoskeletal: no clubbing / cyanosis. No joint deformity upper and lower extremities. Good ROM, no contractures. Normal muscle tone.  Skin: no rashes, lesions, ulcers. No induration Neurologic: CN 2-12 grossly intact. Sensation intact, DTR normal. Strength 5/5 in all 4.  Psychiatric: Normal judgment and insight. Alert and oriented x 3. Normal mood.     Labs on Admission: I have personally reviewed following labs and imaging studies  CBC:  Recent Labs Lab 11/08/16 1130 11/08/16 1354  WBC 12.5* 15.3*  NEUTROABS 9.9*  --   HGB 11.2* 11.2*  HCT 32.7* 33.3*  MCV 95.9 96.0  PLT PLATELET CLUMPS NOTED ON SMEAR, COUNT APPEARS ADEQUATE 500    Basic Metabolic Panel:  Recent Labs Lab 11/08/16 1130  NA 136  K 4.0  CL 104  CO2 24  GLUCOSE 186*  BUN 17  CREATININE 0.80  CALCIUM 8.4*    GFR: Estimated Creatinine Clearance: 84.9 mL/min (by C-G formula based on SCr of 0.8 mg/dL).  Liver Function Tests:  Recent Labs Lab 11/08/16 1130  AST 27  ALT 28  ALKPHOS 43  BILITOT 0.6  PROT 7.1  ALBUMIN 3.7    Recent Labs Lab 11/08/16 1130  LIPASE 26   No results for input(s): AMMONIA in the last 168 hours.  Coagulation Profile:  Recent Labs Lab 11/08/16 1130  INR 1.12   No results for input(s): DDIMER in the last 72 hours.  Cardiac Enzymes: No results for input(s): CKTOTAL, CKMB, CKMBINDEX, TROPONINI in the last 168 hours.  BNP (last 3 results) No results for input(s): PROBNP in the last 8760 hours.  HbA1C: No results for input(s): HGBA1C in the last 72 hours. No results found for: HGBA1C   CBG: No results for input(s): GLUCAP in the last 168 hours.  Lipid Profile: No results for input(s): CHOL, HDL, LDLCALC, TRIG, CHOLHDL,  LDLDIRECT in the last 72 hours.  Thyroid Function Tests: No results for input(s): TSH, T4TOTAL, FREET4, T3FREE, THYROIDAB in the last 72 hours.  Anemia Panel: No results for input(s): VITAMINB12, FOLATE, FERRITIN, TIBC, IRON, RETICCTPCT in the last 72 hours.  Urine analysis: No results found for: COLORURINE, APPEARANCEUR, LABSPEC, PHURINE, GLUCOSEU, HGBUR, BILIRUBINUR, KETONESUR, PROTEINUR, UROBILINOGEN, NITRITE, LEUKOCYTESUR  Sepsis Labs: @LABRCNTIP (procalcitonin:4,lacticidven:4) )No results found for this or any previous visit (from the past 240 hour(s)).       Radiological Exams on Admission: No results found. No results found.    EKG: Independently reviewed.    Assessment/Plan Active Problems:   GI bleed   Acute blood loss anemia Painless bleeding with stable hemoglobin, likely diverticular GI has evaluated the patient Bowel prep tonight and colonoscopy tomorrow Type and screen Serial CBC, transfuse if GI bleed recurs  Or patient becomes unstable   Hypertension-will use as needed hydralazine  Diabetes mellitus-will check hemoglobin A1c and start the patient on sliding scale  insulin    DVT prophylaxis:SCDs   Code Status History  Full code       consults called:GI   Family Communication: Admission, patients condition and plan of care including tests being ordered have been discussed with the patient  who indicates understanding and agree with the plan and Code Status  Admission status: inpatient    Disposition plan: Further plan will depend as patient's clinical course evolves and further radiologic and laboratory data become available. Likely home when stable   At the time of admission, it appears that the appropriate admission status for this patient is INPATIENT .Thisis judged to be reasonable and necessary in order to provide the required intensity of service to ensure the patient's safetygiven thepresenting symptoms, physical exam findings, and  initial radiographic and laboratory data in the context of their chronic comorbidities.   Reyne Dumas MD [facility] Pager [phone]  If 7PM-7AM, please contact night-coverage [email or web address] Password [email or web address]  11/08/2016, 3:07 PM

## 2016-11-08 NOTE — ED Triage Notes (Signed)
6- 8 bright red bowel movements since last night.

## 2016-11-08 NOTE — ED Provider Notes (Signed)
Emergency Department Provider Note   I have reviewed the triage vital signs and the nursing notes.   HISTORY  Chief Complaint GI Bleeding   HPI Andrew Humphrey is a 76 y.o. male with PMH of DM and HTN resents emergency pertinent for evaluation of multiple episodes of passing bright red per rectum. Symptoms began last night and have been painless. Patient denies similar symptoms in the past. No known history of hemorrhoids. No fevers or shaking chills. No associated nausea and vomiting. Patient takes a baby aspirin daily but no other anti-platelet or anticoagulation. He has never had a colonoscopy.   Past Medical History:  Diagnosis Date  . Diabetes mellitus without complication ([place])   . Hypertension     Patient Active Problem List   Diagnosis Date Noted  . GI bleed 11/08/2016  . Acute blood loss anemia 11/08/2016  . Rectal bleeding     Past Surgical History:  Procedure Laterality Date  . TESTICLE SURGERY     1977  . TONSILLECTOMY      Current Outpatient Rx  . Order #: [id number] Class: Historical Med  . Order #: [id number] Class: Historical Med  . Order #: [id number] Class: Historical Med  . Order #: [id number] Class: Historical Med    Allergies Patient has no known allergies.  History reviewed. No pertinent family history.  Social History Social History  Substance Use Topics  . Smoking status: Never Smoker  . Smokeless tobacco: Not on file  . Alcohol use Yes     Comment: occasional beer    Review of Systems  Constitutional: No fever/chills Eyes: No visual changes. ENT: No sore throat. Cardiovascular: Denies chest pain. Respiratory: Denies shortness of breath. Gastrointestinal: No abdominal pain.  No nausea, no vomiting.  No diarrhea.  No constipation. Genitourinary: Negative for dysuria. Positive BRBPR.  Musculoskeletal: Negative for back pain. Skin: Negative for rash. Neurological: Negative for headaches, focal weakness or numbness.  10-point ROS  otherwise negative.  ____________________________________________   PHYSICAL EXAM:  VITAL SIGNS: ED Triage Vitals  Enc Vitals Group     BP 11/08/16 1038 (!) 147/80     Pulse Rate 11/08/16 1038 61     Resp 11/08/16 1038 17     Temp 11/08/16 1038 97.6 F (36.4 C)     Temp Source 11/08/16 1038 Oral     SpO2 11/08/16 1038 98 %     Weight 11/08/16 1036 190 lb (86.2 kg)     Height 11/08/16 1036 5\' 7"  (1.702 m)   Constitutional: Alert and oriented. Well appearing and in no acute distress. Eyes: Conjunctivae are normal.  Head: Atraumatic. Nose: No congestion/rhinnorhea. Mouth/Throat: Mucous membranes are moist.  Neck: No stridor. Cardiovascular: Normal rate, regular rhythm. Good peripheral circulation. Grossly normal heart sounds.   Respiratory: Normal respiratory effort.  No retractions. Lungs CTAB. Gastrointestinal: Soft with no abdominal tenderness to palpation in all quadrants. Rectal exam with no hemorrhoids or fissures visible. No peri-rectal abscess. No melena but does have gross blood on exam.  Musculoskeletal: No lower extremity tenderness nor edema. No gross deformities of extremities. Neurologic:  Normal speech and language. No gross focal neurologic deficits are appreciated.  Skin:  Skin is warm, dry and intact. No rash noted.  ____________________________________________   LABS (all labs ordered are listed, but only abnormal results are displayed)  Labs Reviewed  COMPREHENSIVE METABOLIC PANEL - Abnormal; Notable for the following:       Result Value   Glucose, Bld 186 (*)    Calcium 8.4 (*)  All other components within normal limits  CBC WITH DIFFERENTIAL/PLATELET - Abnormal; Notable for the following:    WBC 12.5 (*)    RBC 3.41 (*)    Hemoglobin 11.2 (*)    HCT 32.7 (*)    RDW 18.1 (*)    Neutro Abs 9.9 (*)    All other components within normal limits  CBC - Abnormal; Notable for the following:    WBC 15.3 (*)    RBC 3.47 (*)    Hemoglobin 11.2 (*)     HCT 33.3 (*)    RDW 18.0 (*)    All other components within normal limits  POC OCCULT BLOOD, ED - Abnormal; Notable for the following:    Fecal Occult Bld POSITIVE (*)    All other components within normal limits  LIPASE, BLOOD  PROTIME-INR  HEMOGLOBIN A1C  CBC  HEMOGLOBIN A1C  TYPE AND SCREEN   ____________________________________________  RADIOLOGY  None ____________________________________________   PROCEDURES  Procedure(s) performed:   Procedures  None ____________________________________________   INITIAL IMPRESSION / ASSESSMENT AND PLAN / ED COURSE  Pertinent labs & imaging results that were available during my care of the patient were reviewed by me and considered in my medical decision making (see chart for details).  Patient presents to the emergency department for evaluation of rectal bleeding which started last night. He said multiple episodes of bright red blood. He is feeling the urge to defecate but only passing blood. No abdominal tenderness. Low suspicion for diverticulitis. Patient has no external hemorrhoids or palpable internal hemorrhoids on exam. He does have gross blood. Vital signs unremarkable. Plan for labs and reassessment.   Spoke with Dr. Oneida Alar who will see the patient as consult.   Discussed patient's case with Hospitalist to request admission. Patient and family (if present) updated with plan. Care transferred to Hospitalist service.  I reviewed all nursing notes, vitals, pertinent old records, EKGs, labs, imaging (as available).  ____________________________________________  FINAL CLINICAL IMPRESSION(S) / ED DIAGNOSES  Final diagnoses:  Rectal bleeding     MEDICATIONS GIVEN DURING THIS VISIT:  Medications  pantoprazole (PROTONIX) 80 mg in sodium chloride 0.9 % 250 mL (0.32 mg/mL) infusion (8 mg/hr Intravenous New Bag/Given 11/08/16 1454)  acetaminophen (TYLENOL) tablet 650 mg (not administered)    Or  acetaminophen (TYLENOL)  suppository 650 mg (not administered)  ondansetron (ZOFRAN) tablet 4 mg (not administered)    Or  ondansetron (ZOFRAN) injection 4 mg (not administered)  levalbuterol (XOPENEX) nebulizer solution 0.63 mg (not administered)  insulin aspart (novoLOG) injection 0-9 Units (not administered)  sodium chloride 0.9 % bolus 500 mL (0 mLs Intravenous Stopped 11/08/16 1354)  pantoprazole (PROTONIX) 80 mg in sodium chloride 0.9 % 100 mL IVPB (0 mg Intravenous Stopped 11/08/16 1454)  ondansetron (ZOFRAN) injection 4 mg (4 mg Intravenous Given 11/08/16 1427)     NEW OUTPATIENT MEDICATIONS STARTED DURING THIS VISIT:  None   Note:  This document was prepared using Dragon voice recognition software and may include unintentional dictation errors.  Nanda Quinton, MD Emergency Medicine    Arliss Hepburn, [facility], MD 11/08/16 1520

## 2016-11-08 NOTE — ED Notes (Signed)
Patient reports of feeling nauseated. Verbal orders obtain from Dr. Laverta Baltimore.

## 2016-11-09 ENCOUNTER — Encounter (HOSPITAL_COMMUNITY): Payer: Self-pay

## 2016-11-09 ENCOUNTER — Encounter (HOSPITAL_COMMUNITY)
Admission: EM | Disposition: A | Discharge status: Home / Self Care | Payer: Self-pay | Source: Home / Self Care | Attending: Internal Medicine

## 2016-11-09 DIAGNOSIS — D124 Benign neoplasm of descending colon: Secondary | ICD-10-CM

## 2016-11-09 DIAGNOSIS — K644 Residual hemorrhoidal skin tags: Secondary | ICD-10-CM | POA: Diagnosis not present

## 2016-11-09 DIAGNOSIS — D122 Benign neoplasm of ascending colon: Secondary | ICD-10-CM | POA: Diagnosis not present

## 2016-11-09 DIAGNOSIS — D120 Benign neoplasm of cecum: Secondary | ICD-10-CM | POA: Diagnosis not present

## 2016-11-09 DIAGNOSIS — D123 Benign neoplasm of transverse colon: Secondary | ICD-10-CM | POA: Diagnosis not present

## 2016-11-09 DIAGNOSIS — K625 Hemorrhage of anus and rectum: Secondary | ICD-10-CM | POA: Diagnosis not present

## 2016-11-09 DIAGNOSIS — E119 Type 2 diabetes mellitus without complications: Secondary | ICD-10-CM | POA: Diagnosis not present

## 2016-11-09 DIAGNOSIS — D62 Acute posthemorrhagic anemia: Secondary | ICD-10-CM | POA: Diagnosis not present

## 2016-11-09 DIAGNOSIS — I10 Essential (primary) hypertension: Secondary | ICD-10-CM | POA: Diagnosis not present

## 2016-11-09 DIAGNOSIS — K921 Melena: Secondary | ICD-10-CM | POA: Diagnosis not present

## 2016-11-09 DIAGNOSIS — K5731 Diverticulosis of large intestine without perforation or abscess with bleeding: Secondary | ICD-10-CM | POA: Diagnosis not present

## 2016-11-09 HISTORY — PX: COLONOSCOPY: SHX5424

## 2016-11-09 LAB — CBC
HEMATOCRIT: 28.9 % — AB (ref 39.0–52.0)
Hemoglobin: 9.6 g/dL — ABNORMAL LOW (ref 13.0–17.0)
MCH: 31.8 pg (ref 26.0–34.0)
MCHC: 33.2 g/dL (ref 30.0–36.0)
MCV: 95.7 fL (ref 78.0–100.0)
Platelets: 143 10*3/uL — ABNORMAL LOW (ref 150–400)
RBC: 3.02 MIL/uL — ABNORMAL LOW (ref 4.22–5.81)
RDW: 18.1 % — AB (ref 11.5–15.5)
WBC: 12.3 10*3/uL — ABNORMAL HIGH (ref 4.0–10.5)

## 2016-11-09 LAB — COMPREHENSIVE METABOLIC PANEL
ALBUMIN: 3.2 g/dL — AB (ref 3.5–5.0)
ALT: 26 U/L (ref 17–63)
AST: 26 U/L (ref 15–41)
Alkaline Phosphatase: 41 U/L (ref 38–126)
Anion gap: 7 (ref 5–15)
BILIRUBIN TOTAL: 0.8 mg/dL (ref 0.3–1.2)
BUN: 12 mg/dL (ref 6–20)
CHLORIDE: 105 mmol/L (ref 101–111)
CO2: 24 mmol/L (ref 22–32)
Calcium: 8.1 mg/dL — ABNORMAL LOW (ref 8.9–10.3)
Creatinine, Ser: 0.8 mg/dL (ref 0.61–1.24)
GFR calc Af Amer: >60 mL/min (ref >60)
GFR calc non Af Amer: >60 mL/min (ref >60)
GLUCOSE: 139 mg/dL — AB (ref 65–99)
Potassium: 3.8 mmol/L (ref 3.5–5.1)
SODIUM: 136 mmol/L (ref 135–145)
Total Protein: 5.8 g/dL — ABNORMAL LOW (ref 6.5–8.1)

## 2016-11-09 LAB — GLUCOSE, CAPILLARY
GLUCOSE-CAPILLARY: 161 mg/dL — AB (ref 65–99)
GLUCOSE-CAPILLARY: 143 mg/dL — AB (ref 65–99)
Glucose-Capillary: 142 mg/dL — ABNORMAL HIGH (ref 65–99)
Glucose-Capillary: 145 mg/dL — ABNORMAL HIGH (ref 65–99)

## 2016-11-09 LAB — TYPE AND SCREEN
ABO/RH(D): A POS
ANTIBODY SCREEN: NEGATIVE

## 2016-11-09 SURGERY — COLONOSCOPY
Anesthesia: Moderate Sedation

## 2016-11-09 MED ORDER — ASPIRIN EC 81 MG PO TBEC: 81.0000 mg | DELAYED_RELEASE_TABLET | Freq: Every morning | ORAL | 1 refills | Status: DC

## 2016-11-09 MED ORDER — MIDAZOLAM HCL 5 MG/5ML IJ SOLN
INTRAMUSCULAR | Status: AC
  Filled 2016-11-09: qty 10

## 2016-11-09 MED ORDER — MEPERIDINE HCL 100 MG/ML IJ SOLN
INTRAMUSCULAR | Status: DC | PRN
  Administered 2016-11-09 (×3): 25 mg

## 2016-11-09 MED ORDER — DEXTROSE-NACL 5-0.9 % IV SOLN: INTRAVENOUS | Status: DC

## 2016-11-09 MED ORDER — MIDAZOLAM HCL 5 MG/5ML IJ SOLN
INTRAMUSCULAR | Status: DC | PRN
  Administered 2016-11-09 (×2): 2 mg via INTRAVENOUS
  Administered 2016-11-09: 1 mg via INTRAVENOUS

## 2016-11-09 MED ORDER — MEPERIDINE HCL 100 MG/ML IJ SOLN
INTRAMUSCULAR | Status: AC
  Filled 2016-11-09: qty 2

## 2016-11-09 MED ORDER — METOPROLOL SUCCINATE ER 50 MG PO TB24
50.0000 mg | ORAL_TABLET | Freq: Every morning | ORAL | Status: DC
  Administered 2016-11-09: 50 mg via ORAL
  Filled 2016-11-09: qty 1

## 2016-11-09 MED ORDER — STERILE WATER FOR IRRIGATION IR SOLN
Status: DC | PRN
  Administered 2016-11-09: 09:00:00

## 2016-11-09 MED ORDER — FERROUS SULFATE 325 (65 FE) MG PO TABS: 325.0000 mg | ORAL_TABLET | Freq: Two times a day (BID) | ORAL | 3 refills | Status: DC

## 2016-11-09 MED ORDER — PANTOPRAZOLE SODIUM 40 MG IV SOLR: 40.0000 mg | Freq: Two times a day (BID) | INTRAVENOUS | Status: DC

## 2016-11-09 NOTE — Interval H&P Note (Signed)
History and Physical Interval Note:  11/09/2016 9:13 AM  Andrew Humphrey  has presented today for surgery, with the diagnosis of rectal bleeding  The various methods of treatment have been discussed with the patient and family. After consideration of risks, benefits and other options for treatment, the patient has consented to  Procedure(s): COLONOSCOPY (N/A) as a surgical intervention .  The patient's history has been reviewed, patient examined, no change in status, stable for surgery.  I have reviewed the patient's chart and labs.  Questions were answered to the patient's satisfaction.     [employer]

## 2016-11-09 NOTE — Op Note (Addendum)
[facility] Patient Name: Andrew Humphrey Procedure Date: 11/09/2016 8:50 AM MRN: [id number] Date of Birth: 07-May-1940 Attending MD: Barney Drain MD, MD CSN: [id number] Age: 76 Admit Type: Outpatient Procedure:                Colonoscopy with COLD FORCEPS/SNARE AND SNARE                            CAUTERY POLYPECTOMY Indications:              Hematochezia Providers:                Barney Drain MD, MD, Janeece Riggers, RN, Bonnetta Barry,                            Technician Referring MD:              Medicines:                Meperidine 75 mg IV, Midazolam 5 mg IV Complications:            No immediate complications. Estimated Blood Loss:     Estimated blood loss was minimal. Procedure:                Pre-Anesthesia Assessment:                           - Prior to the procedure, a History and Physical                            was performed, and patient medications and                            allergies were reviewed. The patient's tolerance of                            previous anesthesia was also reviewed. The risks                            and benefits of the procedure and the sedation                            options and risks were discussed with the patient.                            All questions were answered, and informed consent                            was obtained. Prior Anticoagulants: The patient has                            taken aspirin, last dose was 1 day prior to                            procedure. ASA Grade Assessment: II - A patient  with mild systemic disease. After reviewing the                            risks and benefits, the patient was deemed in                            satisfactory condition to undergo the procedure.                            After obtaining informed consent, the colonoscope                            was passed under direct vision. Throughout the                            procedure, the  patient's blood pressure, pulse, and                            oxygen saturations were monitored continuously. The                            EC-[id number] ([id number]) scope was introduced through                            the anus and advanced to the 8 cm into the ileum.                            The colonoscopy was performed without difficulty.                            The patient tolerated the procedure well. The                            quality of the bowel preparation was good. The                            terminal ileum, ileocecal valve, appendiceal                            orifice, and rectum were photographed. Scope In: 9:26:24 AM Scope Out: 10:03:07 AM Scope Withdrawal Time: 0 hours 34 minutes 46 seconds  Total Procedure Duration: 0 hours 36 minutes 43 seconds  Findings:      The terminal ileum appeared normal.      Multiple small and large-mouthed diverticula were found in the       recto-sigmoid colon and sigmoid colon.      A 3 mm polyp was found in the cecum. The polyp was sessile. The polyp       was removed with a cold biopsy forceps. Resection and retrieval were       complete. NO OLD BLOOD OR FRESH BLOOD IN ILEUM OR COLON.      Three sessile polyps were found in the mid descending colon, proximal       transverse colon and proximal ascending colon. The polyps were 4 to 5 mm  in size. These polyps were removed with a cold snare. Resection and       retrieval were complete.      External hemorrhoids were found during retroflexion. The hemorrhoids       were moderate. Impression:               - The examined portion of the ileum was normal.                           - RECTAL BLEEDING DUE TO Diverticulosis in the                            recto-sigmoid colon and in the sigmoid colon.                           - One 3 mm polyp in the cecum, removed with a cold                            biopsy forceps. Resected and retrieved.                           - Three 4  to 5 mm polyps in the mid descending                            colon, in the proximal transverse colon and in the                            proximal ascending colon, removed with a cold                            snare. Resected and retrieved.                           - External hemorrhoids. Moderate Sedation:      Moderate (conscious) sedation was administered by the endoscopy nurse       and supervised by the endoscopist. The following parameters were       monitored: oxygen saturation, heart rate, blood pressure, and response       to care. Total physician intraservice time was 53 minutes. Recommendation:           - High fiber diet.                           - Continue present medications. OK TO TAKE ASA 81                            MG DAILY.                           - Await pathology results.                           - MAY DISCHARGE PT AFTER RECOVERY                           -  Patient has a contact number available for                            emergencies. The signs and symptoms of potential                            delayed complications were discussed with the                            patient. Return to normal activities tomorrow.                            Written discharge instructions were provided to the                            patient.                           - No repeat colonoscopy due to age. Procedure Code(s):        --- Professional ---                           [phone], Colonoscopy, flexible; with removal of                            tumor(s), polyp(s), or other lesion(s) by snare                            technique                           45380, 59, Colonoscopy, flexible; with biopsy,                            single or multiple                           99152, Moderate sedation services provided by the                            same physician or other qualified health care                            professional performing the diagnostic or                             therapeutic service that the sedation supports,                            requiring the presence of an independent trained                            observer to assist in the monitoring of the                            patient's  level of consciousness and physiological                            status; initial 15 minutes of intraservice time,                            patient age 46 years or older                           [phone], Moderate sedation services; each additional                            15 minutes intraservice time                           [phone], Moderate sedation services; each additional                            15 minutes intraservice time                           99153, Moderate sedation services; each additional                            15 minutes intraservice time Diagnosis Code(s):        --- Professional ---                           K64.4, Residual hemorrhoidal skin tags                           D12.0, Benign neoplasm of cecum                           D12.4, Benign neoplasm of descending colon                           D12.3, Benign neoplasm of transverse colon (hepatic                            flexure or splenic flexure)                           D12.2, Benign neoplasm of ascending colon                           K92.1, Melena (includes Hematochezia)                           K57.30, Diverticulosis of large intestine without                            perforation or abscess without bleeding CPT copyright 2016 [employer]. All rights reserved. The codes documented in this report are preliminary and upon coder review may  be revised to meet current compliance requirements. Barney Drain, MD Barney Drain MD, MD 11/09/2016 10:13:50 AM This report has  been signed electronically. Number of Addenda: 0

## 2016-11-09 NOTE — H&P (View-Only) (Signed)
Referring Provider: No ref. provider found Primary Care Physician:  Lanelle Bal, PA-C Primary Gastroenterologist:  Barney Drain  Reason for Consultation:  RECTAL BLEEDING   Impression: ADMITTED WITH PAINLESS RECTAL BLEEDING. DIFFERENTIAL DIAGNOSIS INCLUDES DIVERTICULAR LGIB, HEMORRHOIDS, COLON POLYPS, AVMs, & LESS LIKELY COLON CA OR LARGE VOLUME UGIB.   Plan: 1. CLEAR LIQUIDS TODAY. NPO AFTER MN EXCEPT CE CHIPS/SIP WITH MEDS. 2. BOWEL PREP TODAY. TAP WATER ENEMAS IN AM. 3. COLONOSCOPY/POSSIBLE HEMORRHOID BANDING IN 9 AM. DISCUSSED PROCEDURE, BENEFITS, & RISKS: < 1% chance of medication reaction, bleeding, perforation, or rupture of spleen/liver. 4. D/C PROTONIX DRIP ONCE INFUSION IS COMPLETE.  GREATER THAN 50% WAS SPENT IN COUNSELING & COORDINATION OF CARE WITH THE PATIENT AND DAUGHTER VIA PHONE: DISCUSSED DIFFERENTIAL DIAGNOSIS, PROCEDURE, BENEFITS, RISKS, AND MANAGEMENT OF PAINLESS RECTAL BLEEDING/COLONOSCOPY. TOTAL ENCOUNTER TIME: 38 MINS.        HPI:  In usual [place] TO PASS GAS. WHEN HE DID IT FELT WET AND THEN IT WENT DOWN HIS LEG. HAPPENED @9  PM LAST NIGHT. NO RECTAL BLEEDING SINCE 0830 TODAY. BMS: USU.1-2X/DAY. DIANE BROWN HELPS HIM(GIRLFRIEND). DAUGHTER WORKS AT [employer]. ASA DAILY. TOOK ONE REGULAR ASA THIS AM. MAY HAVE HAD A STROKE IN THE PAST. RARE IBUPROFEN. OCCASIONAL BEER. NO BC/GOODY POWDERS, OR NAPROXEN/ALEVE. PT UNABLE TO QUANTIFY #TIMES-RECORDS SHOW 6-8 TIMES SINCE 9 PM YESTERDAY.  PT DENIES FEVER, CHILLS, nausea, vomiting, melena, diarrhea, CHEST PAIN, SHORTNESS OF BREATH, CHANGE IN BOWEL IN HABITS, constipation, abdominal pain, problems swallowing, problems with sedation, OR heartburn or indigestion.  Past Medical History:  Diagnosis Date  . Diabetes mellitus without complication ([place])   . Hypertension    Past Surgical History:  Procedure Laterality Date  . TESTICLE SURGERY     1977  . TONSILLECTOMY      Prior to Admission  medications   Medication Sig Start Date End Date Taking? Authorizing Provider  aspirin 325 MG tablet Take 325 mg x1, WAS 81mg  so he took what he had).   Yes [provider]         guaiFENesin (MUCINEX) 600 MG 12 hr tablet Take 600 mg by mouth 2 (two) times daily as needed for to loosen phlegm.   Yes [provider]  metoprolol succinate (TOPROL-XL) 100 MG 24 hr tablet Take 100 mg by mouth every morning. 10/04/14  Yes [provider]    Current Facility-Administered Medications  Medication Dose Route Frequency Provider Last Rate Last Dose  . acetaminophen (TYLENOL) tablet 650 mg  650 mg Oral Q6H PRN        Or  . acetaminophen (TYLENOL) suppository 650 mg  650 mg Rectal Q6H PRN       . levalbuterol (XOPENEX) nebulizer solution 0.63 mg  0.63 mg Nebulization Q6H PRN       . ondansetron (ZOFRAN) tablet 4 mg  4 mg Oral Q6H PRN        Or  . ondansetron (ZOFRAN) injection 4 mg  4 mg Intravenous Q6H PRN       . pantoprazole (PROTONIX) 80 mg in sodium chloride 0.9 % 250 mL (0.32 mg/mL) infusion  8 mg/hr Intravenous Continuous         Allergies as of 11/08/2016  . (No Known Allergies)   Family History  Problem Relation Age of Onset  . Colon cancer Neg Hx   . Colon polyps Neg Hx     Social History   Social History  . Marital status: Single    Spouse  name: SIGNIFICANT OTHER: DIANE, DAUGHTER: NIKKI  . Number of children: N/A  . Years of education: N/A   Social History Main Topics  . Smoking status: Never Smoker  . Smokeless tobacco: Not on file  . Alcohol use Yes     Comment: occasional beer  . Drug use: No  . Sexual activity: Not on file   Other Topics Concern  . Not on file   Social History Narrative  . 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  Vitals: Blood pressure 131/88, pulse (!) 59, temperature 97.6 F (36.4 C), temperature source Oral, resp. rate 13, height 5\' 7"  (1.702 m),  weight 190 lb (86.2 kg), SpO2 97 %.  Physical Exam: General:   Alert,  Well-developed, well-nourished, pleasant and cooperative in NAD Head:  Normocephalic and atraumatic. Eyes:  Sclera clear, no icterus.   Conjunctiva pink. Mouth:  No lesions, dentition normal. Neck:  Supple; no masses. Lungs:  Clear throughout to auscultation.   No wheezes. No acute distress. Heart:  Regular rate and rhythm; no murmurs. Abdomen:  Soft, nontender and nondistended. No masses noted. Normal bowel sounds, without guarding, and without rebound.   Msk:  Symmetrical without gross deformities.  Extremities:  Without edema. Neurologic:  Alert and  oriented x4;  grossly normal neurologically. Cervical Nodes:  No significant cervical adenopathy. Psych:  Alert and cooperative. Normal mood and affect.   Lab Results:  Recent Labs  11/08/16 1130  WBC 12.5*  HGB 11.2*  HCT 32.7*  PLT PLATELET CLUMPS NOTED ON SMEAR, COUNT APPEARS ADEQUATE   BMET  Recent Labs  11/08/16 1130  NA 136  K 4.0  CL 104  CO2 24  GLUCOSE 186*  BUN 17  CREATININE 0.80  CALCIUM 8.4*   LFT  Recent Labs  11/08/16 1130  PROT 7.1  ALBUMIN 3.7  AST 27  ALT 28  ALKPHOS 43  BILITOT 0.6     Studies/Results: NONE   LOS: 0 days   Sandi Fields  11/08/2016, 2:51 PM

## 2016-11-09 NOTE — Discharge Summary (Addendum)
Physician Discharge Summary  Andrew Humphrey MRN: [id number] DOB/AGE: [birth year] 76 y.o.  PCP: Lanelle Bal, PA-C   Admit date: 11/08/2016 Discharge date: 11/09/2016  Discharge Diagnoses:   Active Problems:   GI bleed   Acute blood loss anemia    Follow-up recommendations Follow-up with PCP in 3-5 days , including all  additional recommended appointments as below Follow-up CBC, CMP in 3-5 days Recommend repeat A1C in 3 months       Current Discharge Medication List    START taking these medications   Details  ferrous sulfate 325 (65 FE) MG tablet Take 1 tablet (325 mg total) by mouth 2 (two) times daily with a meal. Qty: 60 tablet, Refills: 3      CONTINUE these medications which have CHANGED   Details  aspirin EC 81 MG tablet Take 1 tablet (81 mg total) by mouth every morning. Qty: 30 tablet, Refills: 1      CONTINUE these medications which have NOT CHANGED   Details  guaiFENesin (MUCINEX) 600 MG 12 hr tablet Take 600 mg by mouth 2 (two) times daily as needed for to loosen phlegm.    metoprolol succinate (TOPROL-XL) 100 MG 24 hr tablet Take 100 mg by mouth every morning.      STOP taking these medications     aspirin 325 MG tablet          Discharge Condition:Stable   Discharge Instructions Get Medicines reviewed and adjusted: Please take all your medications with you for your next visit with your Primary MD  Please request your Primary MD to go over all hospital tests and procedure/radiological results at the follow up, please ask your Primary MD to get all Hospital records sent to his/her office.  If you experience worsening of your admission symptoms, develop shortness of breath, life threatening emergency, suicidal or homicidal thoughts you must seek medical attention immediately by calling 911 or calling your MD immediately if symptoms less severe.  You must read complete instructions/literature along with all the possible adverse  reactions/side effects for all the Medicines you take and that have been prescribed to you. Take any new Medicines after you have completely understood and accpet all the possible adverse reactions/side effects.   Do not drive when taking Pain medications.   Do not take more than prescribed Pain, Sleep and Anxiety Medications  Special Instructions: If you have smoked or chewed Tobacco in the last 2 yrs please stop smoking, stop any regular Alcohol and or any Recreational drug use.  Wear Seat belts while driving.  Please note  You were cared for by a hospitalist during your hospital stay. Once you are discharged, your primary care physician will handle any further medical issues. Please note that NO REFILLS for any discharge medications will be authorized once you are discharged, as it is imperative that you return to your primary care physician (or establish a relationship with a primary care physician if you do not have one) for your aftercare needs so that they can reassess your need for medications and monitor your lab values.  Discharge Instructions    Diet - low sodium heart healthy    Complete by:  As directed    Increase activity slowly    Complete by:  As directed        No Known Allergies    Disposition: 01-Home or Self Care   Consults:  GI       Filed Weights   11/08/16 1036 11/08/16 1520 11/09/16  0500  Weight: 86.2 kg (190 lb) 89.8 kg (197 lb 15.6 oz) 91.9 kg (202 lb 9.6 oz)     Microbiology: Recent Results (from the past 240 hour(s))  MRSA PCR Screening     Status: None   Collection Time: 11/08/16  3:24 PM  Result Value Ref Range Status   MRSA by PCR NEGATIVE NEGATIVE Final    Comment:        The GeneXpert MRSA Assay (FDA approved for NASAL specimens only), is one component of a comprehensive MRSA colonization surveillance program. It is not intended to diagnose MRSA infection nor to guide or monitor treatment for MRSA infections.         Blood Culture No results found for: SDES, SPECREQUEST, CULT, REPTSTATUS    Labs: Results for orders placed or performed during the hospital encounter of 11/08/16 (from the past 48 hour(s))  POC occult blood, ED Provider will collect     Status: Abnormal   Collection Time: 11/08/16 11:17 AM  Result Value Ref Range   Fecal Occult Bld POSITIVE (A) NEGATIVE  Comprehensive metabolic panel     Status: Abnormal   Collection Time: 11/08/16 11:30 AM  Result Value Ref Range   Sodium 136 135 - 145 mmol/L   Potassium 4.0 3.5 - 5.1 mmol/L   Chloride 104 101 - 111 mmol/L   CO2 24 22 - 32 mmol/L   Glucose, Bld 186 (H) 65 - 99 mg/dL   BUN 17 6 - 20 mg/dL   Creatinine, Ser 0.80 0.61 - 1.24 mg/dL   Calcium 8.4 (L) 8.9 - 10.3 mg/dL   Total Protein 7.1 6.5 - 8.1 g/dL   Albumin 3.7 3.5 - 5.0 g/dL   AST 27 15 - 41 U/L   ALT 28 17 - 63 U/L   Alkaline Phosphatase 43 38 - 126 U/L   Total Bilirubin 0.6 0.3 - 1.2 mg/dL   GFR calc non Af Amer >60 >60 mL/min   GFR calc Af Amer >60 >60 mL/min    Comment: (NOTE) The eGFR has been calculated using the CKD EPI equation. This calculation has not been validated in all clinical situations. eGFR's persistently <60 mL/min signify possible Chronic Kidney Disease.    Anion gap 8 5 - 15  Lipase, blood     Status: None   Collection Time: 11/08/16 11:30 AM  Result Value Ref Range   Lipase 26 11 - 51 U/L  CBC with Differential     Status: Abnormal   Collection Time: 11/08/16 11:30 AM  Result Value Ref Range   WBC 12.5 (H) 4.0 - 10.5 K/uL   RBC 3.41 (L) 4.22 - 5.81 MIL/uL   Hemoglobin 11.2 (L) 13.0 - 17.0 g/dL   HCT 32.7 (L) 39.0 - 52.0 %   MCV 95.9 78.0 - 100.0 fL   MCH 32.8 26.0 - 34.0 pg   MCHC 34.3 30.0 - 36.0 g/dL   RDW 18.1 (H) 11.5 - 15.5 %   Platelets  150 - 400 K/uL    PLATELET CLUMPS NOTED ON SMEAR, COUNT APPEARS ADEQUATE   Neutrophils Relative % 79 %   Neutro Abs 9.9 (H) 1.7 - 7.7 K/uL   Lymphocytes Relative 12 %   Lymphs Abs 1.5 0.7 -  4.0 K/uL   Monocytes Relative 8 %   Monocytes Absolute 1.0 0.1 - 1.0 K/uL   Eosinophils Relative 1 %   Eosinophils Absolute 0.1 0.0 - 0.7 K/uL   Basophils Relative 0 %   Basophils Absolute 0.0  0.0 - 0.1 K/uL  Protime-INR     Status: None   Collection Time: 11/08/16 11:30 AM  Result Value Ref Range   Prothrombin Time 14.3 11.4 - 15.2 seconds   INR 1.12   Hemoglobin A1c     Status: Abnormal   Collection Time: 11/08/16 11:30 AM  Result Value Ref Range   Hgb A1c MFr Bld 6.4 (H) 4.8 - 5.6 %    Comment: (NOTE) Pre diabetes:          5.7%-6.4% Diabetes:              >6.4% Glycemic control for   <7.0% adults with diabetes    Mean Plasma Glucose 136.98 mg/dL    Comment: Performed at [facility] [address] [place], [place] [address]  Type and screen [facility]     Status: None   Collection Time: 11/08/16 11:32 AM  Result Value Ref Range   ABO/RH(D) A POS    Antibody Screen NEG    Sample Expiration 11/11/2016   CBC     Status: Abnormal   Collection Time: 11/08/16  1:54 PM  Result Value Ref Range   WBC 15.3 (H) 4.0 - 10.5 K/uL   RBC 3.47 (L) 4.22 - 5.81 MIL/uL   Hemoglobin 11.2 (L) 13.0 - 17.0 g/dL   HCT 33.3 (L) 39.0 - 52.0 %   MCV 96.0 78.0 - 100.0 fL   MCH 32.3 26.0 - 34.0 pg   MCHC 33.6 30.0 - 36.0 g/dL   RDW 18.0 (H) 11.5 - 15.5 %   Platelets 175 150 - 400 K/uL    Comment: SPECIMEN CHECKED FOR CLOTS PLATELET COUNT CONFIRMED BY SMEAR   MRSA PCR Screening     Status: None   Collection Time: 11/08/16  3:24 PM  Result Value Ref Range   MRSA by PCR NEGATIVE NEGATIVE    Comment:        The GeneXpert MRSA Assay (FDA approved for NASAL specimens only), is one component of a comprehensive MRSA colonization surveillance program. It is not intended to diagnose MRSA infection nor to guide or monitor treatment for MRSA infections.   Glucose, capillary     Status: None   Collection Time: 11/08/16  4:18 PM  Result Value Ref Range   Glucose-Capillary 94  65 - 99 mg/dL  CBC     Status: Abnormal   Collection Time: 11/08/16  9:31 PM  Result Value Ref Range   WBC 16.1 (H) 4.0 - 10.5 K/uL   RBC 3.32 (L) 4.22 - 5.81 MIL/uL   Hemoglobin 10.9 (L) 13.0 - 17.0 g/dL   HCT 32.0 (L) 39.0 - 52.0 %   MCV 96.4 78.0 - 100.0 fL   MCH 32.8 26.0 - 34.0 pg   MCHC 34.1 30.0 - 36.0 g/dL   RDW 18.2 (H) 11.5 - 15.5 %   Platelets 169 150 - 400 K/uL  Glucose, capillary     Status: Abnormal   Collection Time: 11/08/16 11:43 PM  Result Value Ref Range   Glucose-Capillary 121 (H) 65 - 99 mg/dL   Comment 1 Notify RN   Comprehensive metabolic panel     Status: Abnormal   Collection Time: 11/09/16  5:05 AM  Result Value Ref Range   Sodium 136 135 - 145 mmol/L   Potassium 3.8 3.5 - 5.1 mmol/L   Chloride 105 101 - 111 mmol/L   CO2 24 22 - 32 mmol/L   Glucose, Bld 139 (H) 65 -  99 mg/dL   BUN 12 6 - 20 mg/dL   Creatinine, Ser 0.80 0.61 - 1.24 mg/dL   Calcium 8.1 (L) 8.9 - 10.3 mg/dL   Total Protein 5.8 (L) 6.5 - 8.1 g/dL   Albumin 3.2 (L) 3.5 - 5.0 g/dL   AST 26 15 - 41 U/L   ALT 26 17 - 63 U/L   Alkaline Phosphatase 41 38 - 126 U/L   Total Bilirubin 0.8 0.3 - 1.2 mg/dL   GFR calc non Af Amer >60 >60 mL/min   GFR calc Af Amer >60 >60 mL/min    Comment: (NOTE) The eGFR has been calculated using the CKD EPI equation. This calculation has not been validated in all clinical situations. eGFR's persistently <60 mL/min signify possible Chronic Kidney Disease.    Anion gap 7 5 - 15  CBC     Status: Abnormal   Collection Time: 11/09/16  5:05 AM  Result Value Ref Range   WBC 12.3 (H) 4.0 - 10.5 K/uL   RBC 3.02 (L) 4.22 - 5.81 MIL/uL   Hemoglobin 9.6 (L) 13.0 - 17.0 g/dL   HCT 28.9 (L) 39.0 - 52.0 %   MCV 95.7 78.0 - 100.0 fL   MCH 31.8 26.0 - 34.0 pg   MCHC 33.2 30.0 - 36.0 g/dL   RDW 18.1 (H) 11.5 - 15.5 %   Platelets 143 (L) 150 - 400 K/uL    Comment: SPECIMEN CHECKED FOR CLOTS PLATELET COUNT CONFIRMED BY SMEAR   Glucose, capillary     Status: Abnormal    Collection Time: 11/09/16  5:56 AM  Result Value Ref Range   Glucose-Capillary 142 (H) 65 - 99 mg/dL   Comment 1 Notify RN   Glucose, capillary     Status: Abnormal   Collection Time: 11/09/16  8:29 AM  Result Value Ref Range   Glucose-Capillary 145 (H) 65 - 99 mg/dL     Lipid Panel  No results found for: CHOL, TRIG, HDL, CHOLHDL, VLDL, LDLCALC, LDLDIRECT   Lab Results  Component Value Date   HGBA1C 6.4 (H) 11/08/2016     HPI :    76 year old male with a history of diabetes, hypertension, presents to the ED with 6-8 episodes of bright red blood per rectum. Described as painless hematochezia. No prior history of bleeding. Patient has not had a colonoscopy. Patient denies any recent weight loss. ED course Blood pressure 147/80 pulse 61 respirations 17 temperature 97.6 Hemoglobin 11.2, platelets 175, sodium 136, creatinine 0.8, FOBT positive  HOSPITAL COURSE   GI bleed   Acute blood loss anemia Painless bleeding with stable hemoglobin, likely diverticular GI has evaluated the patient  Patient underwent colonoscopy, rectal bleeding due to diverticulosis in the rectosigmoid colon, one polyp 3 mm removed, 3-4 polyps in the mid descending colon, proximal transverse colon, external hemorrhoids Hemoglobin decreased from 11.2> 9.6 Patient did not have any recurrent bleeding, remained hemodynamically stable Did not receive any transfusion Okay to discharge from a GI standpoint   Hypertension-stable  Diabetes mellitus  hemoglobin A1c 6.4     Discharge Exam:  Blood pressure (!) 105/59, pulse 61, temperature 98.4 F (36.9 C), temperature source Oral, resp. rate (!) 21, height 5' 7"  (1.702 m), weight 91.9 kg (202 lb 9.6 oz), SpO2 99 %. Cardiovascular: Regular rate and rhythm, no murmurs / rubs / gallops. No extremity edema. 2+ pedal pulses. No carotid bruits.  Abdomen: no tenderness, no masses palpated. No hepatosplenomegaly. Bowel sounds positive.  Musculoskeletal: no clubbing  /  cyanosis. No joint deformity upper and lower extremities. Good ROM, no contractures. Normal muscle tone.  Skin: no rashes, lesions, ulcers. No induration Neurologic: CN 2-12 grossly intact. Sensation intact, DTR normal. Strength 5/5 in all 4.  Psychiatric: Normal judgment and insight. Alert and oriented x 3. Normal mood.        SignedReyne Dumas 11/09/2016, 10:20 AM        Time spent >1 hour

## 2016-11-10 ENCOUNTER — Encounter (HOSPITAL_COMMUNITY): Payer: Self-pay | Admitting: Gastroenterology

## 2016-11-13 ENCOUNTER — Telehealth: Payer: Self-pay | Admitting: Gastroenterology

## 2016-11-13 DIAGNOSIS — K5731 Diverticulosis of large intestine without perforation or abscess with bleeding: Secondary | ICD-10-CM | POA: Diagnosis not present

## 2016-11-13 DIAGNOSIS — Z6830 Body mass index (BMI) 30.0-30.9, adult: Secondary | ICD-10-CM | POA: Diagnosis not present

## 2016-11-13 DIAGNOSIS — E611 Iron deficiency: Secondary | ICD-10-CM | POA: Diagnosis not present

## 2016-11-13 NOTE — Telephone Encounter (Signed)
REMINDER IN EPIC °

## 2016-11-13 NOTE — Telephone Encounter (Signed)
Please call pt. HE had FOUR SMALL simple adenomas removed. FOLLOW A HIGH FIBER DIET. NEXT TCS IN 10-15 YEARS IF THE BENEFITS OUTWEIGH THE RISKS.

## 2016-11-13 NOTE — Telephone Encounter (Signed)
Called. Many rings and no answer. Will mail a letter of results.

## 2016-11-28 DIAGNOSIS — E611 Iron deficiency: Secondary | ICD-10-CM | POA: Diagnosis not present

## 2016-12-10 DIAGNOSIS — E611 Iron deficiency: Secondary | ICD-10-CM | POA: Diagnosis not present

## 2016-12-10 DIAGNOSIS — K5731 Diverticulosis of large intestine without perforation or abscess with bleeding: Secondary | ICD-10-CM | POA: Diagnosis not present

## 2016-12-10 DIAGNOSIS — Z6830 Body mass index (BMI) 30.0-30.9, adult: Secondary | ICD-10-CM | POA: Diagnosis not present

## 2017-01-01 DIAGNOSIS — E611 Iron deficiency: Secondary | ICD-10-CM | POA: Diagnosis not present

## 2017-01-07 DIAGNOSIS — Z6831 Body mass index (BMI) 31.0-31.9, adult: Secondary | ICD-10-CM | POA: Diagnosis not present

## 2017-01-07 DIAGNOSIS — I10 Essential (primary) hypertension: Secondary | ICD-10-CM | POA: Diagnosis not present

## 2017-01-07 DIAGNOSIS — Z0001 Encounter for general adult medical examination with abnormal findings: Secondary | ICD-10-CM | POA: Diagnosis not present

## 2017-01-07 DIAGNOSIS — E1165 Type 2 diabetes mellitus with hyperglycemia: Secondary | ICD-10-CM | POA: Diagnosis not present

## 2017-05-04 DIAGNOSIS — K219 Gastro-esophageal reflux disease without esophagitis: Secondary | ICD-10-CM | POA: Diagnosis not present

## 2017-05-04 DIAGNOSIS — E1165 Type 2 diabetes mellitus with hyperglycemia: Secondary | ICD-10-CM | POA: Diagnosis not present

## 2017-05-04 DIAGNOSIS — E611 Iron deficiency: Secondary | ICD-10-CM | POA: Diagnosis not present

## 2017-05-04 DIAGNOSIS — I10 Essential (primary) hypertension: Secondary | ICD-10-CM | POA: Diagnosis not present

## 2017-05-04 DIAGNOSIS — R739 Hyperglycemia, unspecified: Secondary | ICD-10-CM | POA: Diagnosis not present

## 2017-05-04 DIAGNOSIS — R5383 Other fatigue: Secondary | ICD-10-CM | POA: Diagnosis not present

## 2017-05-04 DIAGNOSIS — R1310 Dysphagia, unspecified: Secondary | ICD-10-CM | POA: Diagnosis not present

## 2017-05-07 DIAGNOSIS — E1165 Type 2 diabetes mellitus with hyperglycemia: Secondary | ICD-10-CM | POA: Diagnosis not present

## 2017-05-07 DIAGNOSIS — Z6830 Body mass index (BMI) 30.0-30.9, adult: Secondary | ICD-10-CM | POA: Diagnosis not present

## 2017-05-07 DIAGNOSIS — I10 Essential (primary) hypertension: Secondary | ICD-10-CM | POA: Diagnosis not present

## 2017-05-07 DIAGNOSIS — J0101 Acute recurrent maxillary sinusitis: Secondary | ICD-10-CM | POA: Diagnosis not present

## 2017-06-19 ENCOUNTER — Encounter (HOSPITAL_COMMUNITY): Payer: Self-pay | Admitting: Radiology

## 2017-06-19 ENCOUNTER — Other Ambulatory Visit: Payer: Self-pay

## 2017-06-19 ENCOUNTER — Emergency Department (HOSPITAL_COMMUNITY): Payer: Medicare Other

## 2017-06-19 ENCOUNTER — Inpatient Hospital Stay (HOSPITAL_COMMUNITY)
Admission: EM | Admit: 2017-06-19 | Discharge: 2017-06-20 | DRG: 309 | Disposition: A | Discharge status: Home / Self Care | Payer: Medicare Other | Attending: Family Medicine | Admitting: Family Medicine

## 2017-06-19 DIAGNOSIS — I248 Other forms of acute ischemic heart disease: Secondary | ICD-10-CM | POA: Diagnosis not present

## 2017-06-19 DIAGNOSIS — E119 Type 2 diabetes mellitus without complications: Secondary | ICD-10-CM

## 2017-06-19 DIAGNOSIS — I482 Chronic atrial fibrillation: Principal | ICD-10-CM | POA: Diagnosis present

## 2017-06-19 DIAGNOSIS — R748 Abnormal levels of other serum enzymes: Secondary | ICD-10-CM

## 2017-06-19 DIAGNOSIS — I214 Non-ST elevation (NSTEMI) myocardial infarction: Secondary | ICD-10-CM

## 2017-06-19 DIAGNOSIS — Z8719 Personal history of other diseases of the digestive system: Secondary | ICD-10-CM

## 2017-06-19 DIAGNOSIS — I4891 Unspecified atrial fibrillation: Secondary | ICD-10-CM | POA: Diagnosis not present

## 2017-06-19 DIAGNOSIS — Z7984 Long term (current) use of oral hypoglycemic drugs: Secondary | ICD-10-CM

## 2017-06-19 DIAGNOSIS — Z7982 Long term (current) use of aspirin: Secondary | ICD-10-CM

## 2017-06-19 DIAGNOSIS — I10 Essential (primary) hypertension: Secondary | ICD-10-CM | POA: Diagnosis present

## 2017-06-19 DIAGNOSIS — Z79899 Other long term (current) drug therapy: Secondary | ICD-10-CM | POA: Diagnosis not present

## 2017-06-19 DIAGNOSIS — R7989 Other specified abnormal findings of blood chemistry: Secondary | ICD-10-CM

## 2017-06-19 DIAGNOSIS — R079 Chest pain, unspecified: Secondary | ICD-10-CM | POA: Diagnosis not present

## 2017-06-19 DIAGNOSIS — R778 Other specified abnormalities of plasma proteins: Secondary | ICD-10-CM | POA: Diagnosis present

## 2017-06-19 DIAGNOSIS — R0789 Other chest pain: Secondary | ICD-10-CM | POA: Diagnosis not present

## 2017-06-19 LAB — CBC WITH DIFFERENTIAL/PLATELET
BASOS PCT: 0 %
Basophils Absolute: 0 10*3/uL (ref 0.0–0.1)
EOS ABS: 0.1 10*3/uL (ref 0.0–0.7)
EOS PCT: 1 %
HEMATOCRIT: 37.5 % — AB (ref 39.0–52.0)
Hemoglobin: 12.7 g/dL — ABNORMAL LOW (ref 13.0–17.0)
LYMPHS PCT: 27 %
Lymphs Abs: 3.2 10*3/uL (ref 0.7–4.0)
MCH: 32.6 pg (ref 26.0–34.0)
MCHC: 33.9 g/dL (ref 30.0–36.0)
MCV: 96.2 fL (ref 78.0–100.0)
Monocytes Absolute: 1.2 10*3/uL — ABNORMAL HIGH (ref 0.1–1.0)
Monocytes Relative: 10 %
NEUTROS ABS: 7.2 10*3/uL (ref 1.7–7.7)
NEUTROS PCT: 62 %
Platelets: 203 10*3/uL (ref 150–400)
RBC: 3.9 MIL/uL — ABNORMAL LOW (ref 4.22–5.81)
RDW: 18.4 % — ABNORMAL HIGH (ref 11.5–15.5)
WBC: 11.7 10*3/uL — ABNORMAL HIGH (ref 4.0–10.5)

## 2017-06-19 LAB — COMPREHENSIVE METABOLIC PANEL
ALBUMIN: 3.9 g/dL (ref 3.5–5.0)
ALT: 43 U/L (ref 17–63)
AST: 35 U/L (ref 15–41)
Alkaline Phosphatase: 44 U/L (ref 38–126)
Anion gap: 13 (ref 5–15)
BILIRUBIN TOTAL: 0.9 mg/dL (ref 0.3–1.2)
BUN: 18 mg/dL (ref 6–20)
CALCIUM: 9.7 mg/dL (ref 8.9–10.3)
CO2: 19 mmol/L — AB (ref 22–32)
CREATININE: 0.86 mg/dL (ref 0.61–1.24)
Chloride: 106 mmol/L (ref 101–111)
GFR calc non Af Amer: >60 mL/min (ref >60)
GLUCOSE: 125 mg/dL — AB (ref 65–99)
Potassium: 3.7 mmol/L (ref 3.5–5.1)
SODIUM: 138 mmol/L (ref 135–145)
TOTAL PROTEIN: 7.5 g/dL (ref 6.5–8.1)

## 2017-06-19 LAB — TROPONIN I
Troponin I: 0.18 ng/mL (ref <0.03)
Troponin I: 0.17 ng/mL (ref <0.03)

## 2017-06-19 LAB — MRSA PCR SCREENING: MRSA BY PCR: NEGATIVE

## 2017-06-19 MED ORDER — METFORMIN HCL 500 MG PO TABS: 500.0000 mg | ORAL_TABLET | Freq: Every day | ORAL | Status: DC

## 2017-06-19 MED ORDER — HEPARIN (PORCINE) IN NACL 100-0.45 UNIT/ML-% IJ SOLN
1000.0000 [IU]/h | INTRAMUSCULAR | Status: DC
  Administered 2017-06-19: 1000 [IU]/h via INTRAVENOUS
  Filled 2017-06-19: qty 250

## 2017-06-19 MED ORDER — DILTIAZEM HCL ER COATED BEADS 240 MG PO CP24: 240.0000 mg | ORAL_CAPSULE | Freq: Every day | ORAL | Status: DC

## 2017-06-19 MED ORDER — DILTIAZEM HCL-DEXTROSE 100-5 MG/100ML-% IV SOLN (PREMIX)
INTRAVENOUS | Status: AC
  Filled 2017-06-19: qty 100

## 2017-06-19 MED ORDER — HEPARIN BOLUS VIA INFUSION
4000.0000 [IU] | Freq: Once | INTRAVENOUS | Status: AC
  Administered 2017-06-19: 4000 [IU] via INTRAVENOUS

## 2017-06-19 MED ORDER — ASPIRIN EC 81 MG PO TBEC
81.0000 mg | DELAYED_RELEASE_TABLET | Freq: Every morning | ORAL | Status: DC
  Administered 2017-06-20: 81 mg via ORAL
  Filled 2017-06-19: qty 1

## 2017-06-19 MED ORDER — SODIUM CHLORIDE 0.9 % IV BOLUS
500.0000 mL | Freq: Once | INTRAVENOUS | Status: AC
  Administered 2017-06-19: 500 mL via INTRAVENOUS

## 2017-06-19 MED ORDER — ACETAMINOPHEN 325 MG PO TABS: 650.0000 mg | ORAL_TABLET | ORAL | Status: DC | PRN

## 2017-06-19 MED ORDER — METOPROLOL TARTRATE 5 MG/5ML IV SOLN
5.0000 mg | Freq: Once | INTRAVENOUS | Status: AC
  Administered 2017-06-19: 5 mg via INTRAVENOUS
  Filled 2017-06-19: qty 5

## 2017-06-19 MED ORDER — ASPIRIN 325 MG PO TABS: 325.0000 mg | ORAL_TABLET | Freq: Once | ORAL | Status: DC

## 2017-06-19 MED ORDER — DILTIAZEM HCL-DEXTROSE 100-5 MG/100ML-% IV SOLN (PREMIX): 5.0000 mg/h | INTRAVENOUS | Status: DC

## 2017-06-19 MED ORDER — DILTIAZEM HCL 25 MG/5ML IV SOLN
10.0000 mg | Freq: Once | INTRAVENOUS | Status: AC
  Administered 2017-06-19: 10 mg via INTRAVENOUS

## 2017-06-19 MED ORDER — DILTIAZEM HCL-DEXTROSE 100-5 MG/100ML-% IV SOLN (PREMIX)
5.0000 mg/h | Freq: Once | INTRAVENOUS | Status: AC
  Administered 2017-06-19: 5 mg/h via INTRAVENOUS
  Filled 2017-06-19: qty 100

## 2017-06-19 MED ORDER — ONDANSETRON HCL 4 MG/2ML IJ SOLN
4.0000 mg | Freq: Four times a day (QID) | INTRAMUSCULAR | Status: DC | PRN
  Administered 2017-06-19: 4 mg via INTRAVENOUS
  Filled 2017-06-19: qty 2

## 2017-06-19 NOTE — ED Notes (Signed)
Cardizem gtt changed to 15 mg.hr per verbal order Dr. Roderic Palau.

## 2017-06-19 NOTE — ED Notes (Signed)
Dr. Stinson at bedside.  

## 2017-06-19 NOTE — ED Notes (Signed)
Critical value Troponin 0.18 given to Dr. Roderic Palau.

## 2017-06-19 NOTE — ED Notes (Signed)
Pt daughter's number [phone]- call her first with any problems or concerns

## 2017-06-19 NOTE — Progress Notes (Signed)
Patient Name: Andrew Humphrey, male   DOB: March 08, 1940, 77 y.o.  MRN: [id number]   Patient's HR converted to sinus brady - cardizem d/c'd. Will hold on giving him oral medication until tomorrow.  Will stop lopressor and give cardizem. Start off with 300mg  per day.  Truett Mainland, DO 06/19/2017 9:08 PM

## 2017-06-19 NOTE — ED Triage Notes (Signed)
Pt states that he has been having chest pain since 2200 last night it is going all the way across

## 2017-06-19 NOTE — ED Notes (Signed)
Dr Nehemiah Settle notified that daughter of pt would like to speak with him about pt status and plan of care.

## 2017-06-19 NOTE — ED Notes (Signed)
Transfer report given to RN Jerene Pitch, pt taken to ICU 3

## 2017-06-19 NOTE — ED Provider Notes (Signed)
[facility] EMERGENCY DEPARTMENT Provider Note   CSN: [id number] Arrival date & time: 06/19/17  1611     History   Chief Complaint Chief Complaint  Patient presents with  . Chest Pain    HPI Andrew Humphrey is a 77 y.o. male.  Patient complains of chest tightness since 10 PM last night but he states the tightness has improved  The history is provided by the patient. No language interpreter was used.  Chest Pain   This is a new problem. The current episode started 12 to 24 hours ago. The problem occurs constantly. The problem has not changed since onset.The pain is present in the substernal region. The pain is at a severity of 4/10. The pain is moderate. The quality of the pain is described as dull. The pain does not radiate. The symptoms are aggravated by certain positions. Pertinent negatives include no abdominal pain, no back pain, no cough and no headaches.  Pertinent negatives for past medical history include no seizures.    Past Medical History:  Diagnosis Date  . Diabetes mellitus without complication ([place])   . Hypertension     Patient Active Problem List   Diagnosis Date Noted  . Atrial fibrillation with RVR ([place]) 06/19/2017  . Elevated troponin 06/19/2017  . Hypertension   . Diabetes mellitus without complication ([place])   . History of GI bleed 11/08/2016  . Acute blood loss anemia 11/08/2016  . Rectal bleeding     Past Surgical History:  Procedure Laterality Date  . COLONOSCOPY N/A 11/09/2016   Procedure: COLONOSCOPY;  Surgeon: Danie Binder, MD;  Location: [facility];  Service: Endoscopy;  Laterality: N/A;  . [place]  . TONSILLECTOMY          Home Medications    Prior to Admission medications   Medication Sig Start Date End Date Taking? Authorizing Provider  aspirin EC 81 MG tablet Take 1 tablet (81 mg total) by mouth every morning. 11/14/16  Yes Reyne Dumas, MD  metFORMIN (GLUCOPHAGE) 1000 MG tablet Take 0.5 tablets by  mouth daily. 06/12/17  Yes [provider]  metoprolol succinate (TOPROL-XL) 100 MG 24 hr tablet Take 100 mg by mouth every morning. 10/04/14  Yes [provider]    Family History Family History  Problem Relation Age of Onset  . Colon cancer Neg Hx   . Colon polyps Neg Hx     Social History Social History   Tobacco Use  . Smoking status: Never Smoker  . Smokeless tobacco: Never Used  Substance Use Topics  . Alcohol use: Yes    Comment: occasional beer  . Drug use: No     Allergies   Patient has no known allergies.   Review of Systems Review of Systems  Constitutional: Negative for appetite change and fatigue.  HENT: Negative for congestion, ear discharge and sinus pressure.   Eyes: Negative for discharge.  Respiratory: Negative for cough.   Cardiovascular: Positive for chest pain.  Gastrointestinal: Negative for abdominal pain and diarrhea.  Genitourinary: Negative for frequency and hematuria.  Musculoskeletal: Negative for back pain.  Skin: Negative for rash.  Neurological: Negative for seizures and headaches.  Psychiatric/Behavioral: Negative for hallucinations.     Physical Exam Updated Vital Signs BP 128/89   Pulse (!) 58   Temp 98 F (36.7 C) (Oral)   Resp 20   Ht 5\' 7"  (1.702 m)   Wt 86.2 kg (190 lb)   SpO2 96%  BMI 29.76 kg/m   Physical Exam  Constitutional: He is oriented to person, place, and time. He appears well-developed.  HENT:  Head: Normocephalic.  Eyes: Conjunctivae and EOM are normal. No scleral icterus.  Neck: Neck supple. No thyromegaly present.  Cardiovascular: Exam reveals no gallop and no friction rub.  No murmur heard. Epidural regular rate  Pulmonary/Chest: No stridor. He has no wheezes. He has no rales. He exhibits no tenderness.  Abdominal: He exhibits no distension. There is no tenderness. There is no rebound.  Musculoskeletal: Normal range of motion. He exhibits no edema.  Lymphadenopathy:    He has no  cervical adenopathy.  Neurological: He is oriented to person, place, and time. He exhibits normal muscle tone. Coordination normal.  Skin: No rash noted. No erythema.  Psychiatric: He has a normal mood and affect. His behavior is normal.     ED Treatments / Results  Labs (all labs ordered are listed, but only abnormal results are displayed) Labs Reviewed  CBC WITH DIFFERENTIAL/PLATELET - Abnormal; Notable for the following components:      Result Value   WBC 11.7 (*)    RBC 3.90 (*)    Hemoglobin 12.7 (*)    HCT 37.5 (*)    RDW 18.4 (*)    Monocytes Absolute 1.2 (*)    All other components within normal limits  COMPREHENSIVE METABOLIC PANEL - Abnormal; Notable for the following components:   CO2 19 (*)    Glucose, Bld 125 (*)    All other components within normal limits  TROPONIN I - Abnormal; Notable for the following components:   Troponin I 0.18 (*)    All other components within normal limits  TSH    EKG EKG Interpretation  Date/Time:  Friday June 19 2017 16:21:48 EDT Ventricular Rate:  148 PR Interval:    QRS Duration: 102 QT Interval:  314 QTC Calculation: 492 R Axis:   -48 Text Interpretation:  Atrial fibrillation with rapid ventricular response Left anterior fascicular block Possible Anterior infarct , age undetermined Abnormal ECG Confirmed by Milton Ferguson [phone]) on 06/19/2017 5:49:53 PM   Radiology Dg Chest Portable 1 View  Result Date: 06/19/2017 CLINICAL DATA:  Chest pain EXAM: PORTABLE CHEST 1 VIEW COMPARISON:  10/15/2014 FINDINGS: Borderline cardiomegaly with mild central congestion. No focal opacity or pleural effusion. No pneumothorax. IMPRESSION: Borderline to mild cardiomegaly with minimal central congestion. No acute focal airspace disease. Electronically Signed   By: Donavan Foil M.D.   On: 06/19/2017 16:59    Procedures Procedures (including critical care time)  Medications Ordered in ED Medications  sodium chloride 0.9 % bolus 500 mL  (500 mLs Intravenous New Bag/Given 06/19/17 1844)  aspirin tablet 325 mg (325 mg Oral Not Given 06/19/17 1834)  heparin ADULT infusion 100 units/mL (25000 units/233mL sodium chloride 0.45%) (1,000 Units/hr Intravenous New Bag/Given 06/19/17 1837)  diltiazem (CARDIZEM) injection 10 mg (10 mg Intravenous Bolus 06/19/17 1645)  diltiazem (CARDIZEM) 100 mg in dextrose 5% 181mL (1 mg/mL) infusion (15 mg/hr Intravenous Rate/Dose Change 06/19/17 1832)  diltiazem (CARDIZEM) injection 10 mg (10 mg Intravenous Given 06/19/17 1710)  metoprolol tartrate (LOPRESSOR) injection 5 mg (5 mg Intravenous Given 06/19/17 1815)  heparin bolus via infusion 4,000 Units (4,000 Units Intravenous Bolus from Bag 06/19/17 1836)     Initial Impression / Assessment and Plan / ED Course  I have reviewed the triage vital signs and the nursing notes.  Pertinent labs & imaging results that were available during my care of the  patient were reviewed by me and considered in my medical decision making (see chart for details).    CRITICAL CARE Performed by: Milton Ferguson Total critical care time: 40 minutes Critical care time was exclusive of separately billable procedures and treating other patients. Critical care was necessary to treat or prevent imminent or life-threatening deterioration. Critical care was time spent personally by me on the following activities: development of treatment plan with patient and/or surrogate as well as nursing, discussions with consultants, evaluation of patient's response to treatment, examination of patient, obtaining history from patient or surrogate, ordering and performing treatments and interventions, ordering and review of laboratory studies, ordering and review of radiographic studies, pulse oximetry and re-evaluation of patient's condition.   Patient with rapid atrial fib with mild elevation of troponin.  I spoke with cardiology and it was felt the patient could be treated at any point hospital.  He  will be admitted by hospitalist and the patient is on a Cardizem drip  Final Clinical Impressions(s) / ED Diagnoses   Final diagnoses:  NSTEMI (non-ST elevated myocardial infarction) [facility])    ED Discharge Orders    None       Milton Ferguson, MD 06/19/17 1857

## 2017-06-19 NOTE — H&P (Signed)
History and Physical  DOIS JUARBE RUE:[id number] DOB: 04-22-1940 DOA: 06/19/2017  Referring physician: Dr Roderic Palau, ED physician PCP: Lanelle Bal, PA-C  Outpatient Specialists: none  Patient Coming From: home  Chief Complaint: chest pain  HPI: Andrew Humphrey is a 77 y.o. male with a history of diabetes, hypertension, history of GI bleed.  Patient presents with chest pain that started last night.  Last night he had diaphoresis and this morning chest pain and continued, so he was brought to the emergency department by his wife.  No palliating or provoking factors.  Chest pain is worsening.  Upon arrival, the patient noted that his chest pain started to improve.  On arrival, he was noted to be in A. fib with a heart rate in the 150s.  He notes that he never had any prior episodes of chest pain, palpitations, shortness of breath, etc.  Emergency Department Course: Monitoring shows A. fib with RVR.  Troponin elevated at 0.18.  Cards recommended admission here, rule out ACS with serial troponins and rate control.  Heparin was started.  Cardizem drip started.  Metoprolol IV push due to elevated heart rate.  Review of Systems:   Pt denies any fevers, chills, nausea, vomiting, diarrhea, constipation, abdominal pain, shortness of breath, dyspnea on exertion, orthopnea, cough, wheezing, palpitations, headache, vision changes, lightheadedness, dizziness, melena, rectal bleeding.  Review of systems are otherwise negative  Past Medical History:  Diagnosis Date  . Diabetes mellitus without complication ([place])   . Hypertension    Past Surgical History:  Procedure Laterality Date  . COLONOSCOPY N/A 11/09/2016   Procedure: COLONOSCOPY;  Surgeon: Danie Binder, MD;  Location[facility];  Service: Endoscopy;  Laterality: N/A;  . [place]  . TONSILLECTOMY     Social History:  reports that he has never smoked. He has never used smokeless tobacco. He reports that he drinks  alcohol. He reports that he does not use drugs. Patient lives at home  No Known Allergies  Family History  Problem Relation Age of Onset  . Colon cancer Neg Hx   . Colon polyps Neg Hx       Prior to Admission medications   Medication Sig Start Date End Date Taking? Authorizing Provider  aspirin EC 81 MG tablet Take 1 tablet (81 mg total) by mouth every morning. 11/14/16  Yes Reyne Dumas, MD  metFORMIN (GLUCOPHAGE) 1000 MG tablet Take 0.5 tablets by mouth daily. 06/12/17  Yes [provider]  metoprolol succinate (TOPROL-XL) 100 MG 24 hr tablet Take 100 mg by mouth every morning. 10/04/14  Yes [provider]    Physical Exam: BP 128/89   Pulse (!) 58   Temp 98 F (36.7 C) (Oral)   Resp 20   Ht 5\' 7"  (1.702 m)   Wt 86.2 kg (190 lb)   SpO2 96%   BMI 29.76 kg/m   . General: Elderly Caucasian male. Awake and alert and oriented x3. No acute cardiopulmonary distress.  Marland Kitchen HEENT: Normocephalic atraumatic.  Right and left ears normal in appearance.  Pupils equal, round, reactive to light. Extraocular muscles are intact. Sclerae anicteric and noninjected.  Moist mucosal membranes. No mucosal lesions.  . Neck: Neck supple without lymphadenopathy. No carotid bruits. No masses palpated.  . Cardiovascular: Irregularly irregular rates. No murmurs, rubs, gallops auscultated. No JVD.  Marland Kitchen Respiratory: Good respiratory effort with no wheezes, rales, rhonchi. Lungs clear to auscultation bilaterally.  No accessory muscle use. . Abdomen:  Soft, nontender, nondistended. Active bowel sounds. No masses or hepatosplenomegaly  . Skin: No rashes, lesions, or ulcerations.  Dry, warm to touch. 2+ dorsalis pedis and radial pulses. . Musculoskeletal: No calf or leg pain. All major joints not erythematous nontender.  No upper or lower joint deformation.  Good ROM.  No contractures  . Psychiatric: Intact judgment and insight. Pleasant and cooperative. . Neurologic: No focal neurological deficits.  Strength is 5/5 and symmetric in upper and lower extremities.  Cranial nerves II through XII are grossly intact.           Labs on Admission: I have personally reviewed following labs and imaging studies  CBC: Recent Labs  Lab 06/19/17 1633  WBC 11.7*  NEUTROABS 7.2  HGB 12.7*  HCT 37.5*  MCV 96.2  PLT 161   Basic Metabolic Panel: Recent Labs  Lab 06/19/17 1633  NA 138  K 3.7  CL 106  CO2 19*  GLUCOSE 125*  BUN 18  CREATININE 0.86  CALCIUM 9.7   GFR: Estimated Creatinine Clearance: 77.8 mL/min (by C-G formula based on SCr of 0.86 mg/dL). Liver Function Tests: Recent Labs  Lab 06/19/17 1633  AST 35  ALT 43  ALKPHOS 44  BILITOT 0.9  PROT 7.5  ALBUMIN 3.9   No results for input(s): LIPASE, AMYLASE in the last 168 hours. No results for input(s): AMMONIA in the last 168 hours. Coagulation Profile: No results for input(s): INR, PROTIME in the last 168 hours. Cardiac Enzymes: Recent Labs  Lab 06/19/17 1633  TROPONINI 0.18*   BNP (last 3 results) No results for input(s): PROBNP in the last 8760 hours. HbA1C: No results for input(s): HGBA1C in the last 72 hours. CBG: No results for input(s): GLUCAP in the last 168 hours. Lipid Profile: No results for input(s): CHOL, HDL, LDLCALC, TRIG, CHOLHDL, LDLDIRECT in the last 72 hours. Thyroid Function Tests: No results for input(s): TSH, T4TOTAL, FREET4, T3FREE, THYROIDAB in the last 72 hours. Anemia Panel: No results for input(s): VITAMINB12, FOLATE, FERRITIN, TIBC, IRON, RETICCTPCT in the last 72 hours. Urine analysis: No results found for: COLORURINE, APPEARANCEUR, LABSPEC, PHURINE, GLUCOSEU, HGBUR, BILIRUBINUR, KETONESUR, PROTEINUR, UROBILINOGEN, NITRITE, LEUKOCYTESUR Sepsis Labs: @LABRCNTIP (procalcitonin:4,lacticidven:4) )No results found for this or any previous visit (from the past 240 hour(s)).   Radiological Exams on Admission: Dg Chest Portable 1 View  Result Date: 06/19/2017 CLINICAL DATA:  Chest  pain EXAM: PORTABLE CHEST 1 VIEW COMPARISON:  10/15/2014 FINDINGS: Borderline cardiomegaly with mild central congestion. No focal opacity or pleural effusion. No pneumothorax. IMPRESSION: Borderline to mild cardiomegaly with minimal central congestion. No acute focal airspace disease. Electronically Signed   By: Donavan Foil M.D.   On: 06/19/2017 16:59    EKG: Independently reviewed.  A. fib with RVR.  No ST changes.  Assessment/Plan: Principal Problem:   Atrial fibrillation with RVR ([facility]) Active Problems:   History of GI bleed   Hypertension   Diabetes mellitus without complication ([facility])   Elevated troponin    This patient was discussed with the ED physician, including pertinent vitals, physical exam findings, labs, and imaging.  We also discussed care given by the ED provider.  1. A. fib with RVR a. Admit b. Cardizem c. Heparin d. Check TSH e. Serial troponins f. Echocardiogram in the morning 2. Elevated troponin a. Serial troponins 3. Diabetes a. Continue glycemic control with metformin 4. Hypertension a. Continue blood pressure control 5. History of GI bleed a. Check Hemoccult  DVT prophylaxis: On heparin Consultants: None Code Status:  Full code Family Communication: Wife in the room Disposition Plan: Patient to return home following admission   Videl Nobrega Moores [facility] Pager [phone]  If 7PM-7AM, please contact night-coverage [email or web address] Password [email or web address]

## 2017-06-19 NOTE — Progress Notes (Signed)
ANTICOAGULATION CONSULT NOTE - Initial Consult  Pharmacy Consult for heparin Indication: atrial fibrillation and r/o ACS  No Known Allergies  Patient Measurements: Height: 5\' 7"  (170.2 cm) Weight: 190 lb (86.2 kg) IBW/kg (Calculated) : 66.1 Heparin Dosing Weight: 83.6  Vital Signs: Temp: 98 F (36.7 C) (04/26 1625) Temp Source: Oral (04/26 1625) BP: 128/89 (04/26 1830) Pulse Rate: 58 (04/26 1830)  Labs: Recent Labs    06/19/17 1633  HGB 12.7*  HCT 37.5*  PLT 203  CREATININE 0.86  TROPONINI 0.18*    Estimated Creatinine Clearance: 77.8 mL/min (by C-G formula based on SCr of 0.86 mg/dL).   Medical History: Past Medical History:  Diagnosis Date  . Diabetes mellitus without complication ([place])   . Hypertension     Medications:   (Not in a hospital admission)  Assessment: Pharmacy consult for pharmacy to dose heparin in patient with atrial fibrillation and to r/o ACS.  Goal of Therapy:  Heparin level 0.3-0.7 units/ml Monitor platelets by anticoagulation protocol: Yes   Plan:  Give 4000 units bolus x 1 Start heparin infusion at 1000 units/hr Check anti-Xa level in 8 hours and daily while on heparin Continue to monitor H&H and platelets  Ramond Craver 06/19/2017,6:44 PM

## 2017-06-19 NOTE — ED Notes (Signed)
Verified with DR Nehemiah Settle that pt no longer needed Cardizem drip as pt is in normal sinus rhythm.

## 2017-06-20 ENCOUNTER — Encounter (HOSPITAL_COMMUNITY): Payer: Self-pay | Admitting: Family Medicine

## 2017-06-20 ENCOUNTER — Inpatient Hospital Stay (HOSPITAL_COMMUNITY): Payer: Medicare Other

## 2017-06-20 DIAGNOSIS — I340 Nonrheumatic mitral (valve) insufficiency: Secondary | ICD-10-CM | POA: Diagnosis not present

## 2017-06-20 DIAGNOSIS — I4891 Unspecified atrial fibrillation: Secondary | ICD-10-CM | POA: Diagnosis not present

## 2017-06-20 DIAGNOSIS — R748 Abnormal levels of other serum enzymes: Secondary | ICD-10-CM | POA: Diagnosis not present

## 2017-06-20 DIAGNOSIS — Z7984 Long term (current) use of oral hypoglycemic drugs: Secondary | ICD-10-CM | POA: Diagnosis not present

## 2017-06-20 DIAGNOSIS — E119 Type 2 diabetes mellitus without complications: Secondary | ICD-10-CM | POA: Diagnosis not present

## 2017-06-20 DIAGNOSIS — Z7982 Long term (current) use of aspirin: Secondary | ICD-10-CM | POA: Diagnosis not present

## 2017-06-20 DIAGNOSIS — Z79899 Other long term (current) drug therapy: Secondary | ICD-10-CM | POA: Diagnosis not present

## 2017-06-20 DIAGNOSIS — I10 Essential (primary) hypertension: Secondary | ICD-10-CM | POA: Diagnosis not present

## 2017-06-20 DIAGNOSIS — I482 Chronic atrial fibrillation: Secondary | ICD-10-CM | POA: Diagnosis not present

## 2017-06-20 DIAGNOSIS — I248 Other forms of acute ischemic heart disease: Secondary | ICD-10-CM | POA: Diagnosis not present

## 2017-06-20 LAB — ECHOCARDIOGRAM COMPLETE
HEIGHTINCHES: 67 in
Weight: 3156.99 oz

## 2017-06-20 LAB — TROPONIN I
TROPONIN I: 0.13 ng/mL — AB (ref <0.03)
Troponin I: 0.15 ng/mL (ref <0.03)

## 2017-06-20 LAB — CBC
HEMATOCRIT: 33.3 % — AB (ref 39.0–52.0)
Hemoglobin: 11.1 g/dL — ABNORMAL LOW (ref 13.0–17.0)
MCH: 32.2 pg (ref 26.0–34.0)
MCHC: 33.3 g/dL (ref 30.0–36.0)
MCV: 96.5 fL (ref 78.0–100.0)
Platelets: 158 10*3/uL (ref 150–400)
RBC: 3.45 MIL/uL — AB (ref 4.22–5.81)
RDW: 18.5 % — AB (ref 11.5–15.5)
WBC: 9.2 10*3/uL (ref 4.0–10.5)

## 2017-06-20 LAB — HEPARIN LEVEL (UNFRACTIONATED)

## 2017-06-20 LAB — GLUCOSE, CAPILLARY
Glucose-Capillary: 125 mg/dL — ABNORMAL HIGH (ref 65–99)
Glucose-Capillary: 110 mg/dL — ABNORMAL HIGH (ref 65–99)

## 2017-06-20 LAB — TSH: TSH: 4.064 u[IU]/mL (ref 0.350–4.500)

## 2017-06-20 MED ORDER — METOPROLOL SUCCINATE ER 50 MG PO TB24
100.0000 mg | ORAL_TABLET | Freq: Every morning | ORAL | Status: DC
  Administered 2017-06-20: 100 mg via ORAL
  Filled 2017-06-20: qty 2

## 2017-06-20 MED ORDER — APIXABAN 5 MG PO TABS
5.0000 mg | ORAL_TABLET | Freq: Two times a day (BID) | ORAL | Status: DC
  Administered 2017-06-20: 5 mg via ORAL
  Filled 2017-06-20: qty 1

## 2017-06-20 MED ORDER — INSULIN ASPART 100 UNIT/ML ~~LOC~~ SOLN
0.0000 [IU] | Freq: Three times a day (TID) | SUBCUTANEOUS | Status: DC
  Administered 2017-06-20: 1 [IU] via SUBCUTANEOUS

## 2017-06-20 MED ORDER — APIXABAN 5 MG PO TABS: 5.0000 mg | ORAL_TABLET | Freq: Two times a day (BID) | ORAL | 0 refills | Status: DC

## 2017-06-20 NOTE — Discharge Instructions (Signed)
Please follow up with cardiologist in 1-2 weeks.  Please follow up on echocardiogram results with your primary care provider next week.   Please see your primary care physician early next week. Seek medical care or return if symptoms recur, worsen or new problem develops.     Follow with Primary MD  Lanelle Bal, PA-C  and other consultants as instructed your Hospitalist MD  Please get a complete blood count and chemistry panel checked by your Primary MD at your next visit, and again as instructed by your Primary MD.  Get Medicines reviewed and adjusted: Please take all your medications with you for your next visit with your Primary MD  Laboratory/radiological data: Please request your Primary MD to go over all hospital tests and procedure/radiological results at the follow up, please ask your Primary MD to get all Hospital records sent to his/her office.  In some cases, they will be blood work, cultures and biopsy results pending at the time of your discharge. Please request that your primary care M.D. follows up on these results.  Also Note the following: If you experience worsening of your admission symptoms, develop shortness of breath, life threatening emergency, suicidal or homicidal thoughts you must seek medical attention immediately by calling 911 or calling your MD immediately  if symptoms less severe.  You must read complete instructions/literature along with all the possible adverse reactions/side effects for all the Medicines you take and that have been prescribed to you. Take any new Medicines after you have completely understood and accpet all the possible adverse reactions/side effects.   Do not drive when taking Pain medications or sleeping medications (Benzodaizepines)  Do not take more than prescribed Pain, Sleep and Anxiety Medications. It is not advisable to combine anxiety,sleep and pain medications without talking with your primary care practitioner  Special  Instructions: If you have smoked or chewed Tobacco  in the last 2 yrs please stop smoking, stop any regular Alcohol  and or any Recreational drug use.  Wear Seat belts while driving.  Please note: You were cared for by a hospitalist during your hospital stay. Once you are discharged, your primary care physician will handle any further medical issues. Please note that NO REFILLS for any discharge medications will be authorized once you are discharged, as it is imperative that you return to your primary care physician (or establish a relationship with a primary care physician if you do not have one) for your post hospital discharge needs so that they can reassess your need for medications and monitor your lab values.    Information on my medicine - ELIQUIS (apixaban)  This medication education was reviewed with me or my healthcare representative as part of my discharge preparation.   Why was Eliquis prescribed for you? Eliquis was prescribed for you to reduce the risk of a blood clot forming that can cause a stroke if you have a medical condition called atrial fibrillation (a type of irregular heartbeat).  What do You need to know about Eliquis ? Take your Eliquis TWICE DAILY - one tablet in the morning and one tablet in the evening with or without food. If you have difficulty swallowing the tablet whole please discuss with your pharmacist how to take the medication safely.  Take Eliquis exactly as prescribed by your doctor and DO NOT stop taking Eliquis without talking to the doctor who prescribed the medication.  Stopping may increase your risk of developing a stroke.  Refill your prescription before you run out.  After discharge, you should have regular check-up appointments with your healthcare provider that is prescribing your Eliquis.  In the future your dose may need to be changed if your kidney function or weight changes by a significant amount or as you get older.  What do you do  if you miss a dose? If you miss a dose, take it as soon as you remember on the same day and resume taking twice daily.  Do not take more than one dose of ELIQUIS at the same time to make up a missed dose.  Important Safety Information A possible side effect of Eliquis is bleeding. You should call your healthcare provider right away if you experience any of the following: ? Bleeding from an injury or your nose that does not stop. ? Unusual colored urine (red or dark brown) or unusual colored stools (red or black). ? Unusual bruising for unknown reasons. ? A serious fall or if you hit your head (even if there is no bleeding).  Some medicines may interact with Eliquis and might increase your risk of bleeding or clotting while on Eliquis. To help avoid this, consult your healthcare provider or pharmacist prior to using any new prescription or non-prescription medications, including herbals, vitamins, non-steroidal anti-inflammatory drugs (NSAIDs) and supplements.  This website has more information on Eliquis (apixaban): http://www.eliquis.com/eliquis/home    Atrial Fibrillation Atrial fibrillation is a type of heartbeat that is irregular or fast (rapid). If you have this condition, your heart keeps quivering in a weird (chaotic) way. This condition can make it so your heart cannot pump blood normally. Having this condition gives a person more risk for stroke, heart failure, and other heart problems. There are different types of atrial fibrillation. Talk with your doctor to learn about the type that you have. Follow these instructions at home:  Take over-the-counter and prescription medicines only as told by your doctor.  If your doctor prescribed a blood-thinning medicine, take it exactly as told. Taking too much of it can cause bleeding. If you do not take enough of it, you will not have the protection that you need against stroke and other problems.  Do not use any tobacco products. These  include cigarettes, chewing tobacco, and e-cigarettes. If you need help quitting, ask your doctor.  If you have apnea (obstructive sleep apnea), manage it as told by your doctor.  Do not drink alcohol.  Do not drink beverages that have caffeine. These include coffee, soda, and tea.  Maintain a healthy weight. Do not use diet pills unless your doctor says they are safe for you. Diet pills may make heart problems worse.  Follow diet instructions as told by your doctor.  Exercise regularly as told by your doctor.  Keep all follow-up visits as told by your doctor. This is important. Contact a doctor if:  You notice a change in the speed, rhythm, or strength of your heartbeat.  You are taking a blood-thinning medicine and you notice more bruising.  You get tired more easily when you move or exercise. Get help right away if:  You have pain in your chest or your belly (abdomen).  You have sweating or weakness.  You feel sick to your stomach (nauseous).  You notice blood in your throw up (vomit), poop (stool), or pee (urine).  You are short of breath.  You suddenly have swollen feet and ankles.  You feel dizzy.  Your suddenly get weak or numb in your face, arms, or legs, especially if it  happens on one side of your body.  You have trouble talking, trouble understanding, or both.  Your face or your eyelid droops on one side. These symptoms may be an emergency. Do not wait to see if the symptoms will go away. Get medical help right away. Call your local emergency services (911 in the [place] Do not drive yourself to the hospital. This information is not intended to replace advice given to you by your health care provider. Make sure you discuss any questions you have with your health care provider. Document Released: 11/20/2007 Document Revised: 07/19/2015 Document Reviewed: 06/07/2014 Elsevier Interactive Patient Education  [employer].

## 2017-06-20 NOTE — Progress Notes (Signed)
ANTICOAGULATION CONSULT NOTE - Preliminary  Pharmacy Consult for eliquis Indication: nonvalvular atrial fibrillation  No Known Allergies  Patient Measurements: Height: 5\' 7"  (170.2 cm) Weight: 197 lb 5 oz (89.5 kg) IBW/kg (Calculated) : 66.1 HEPARIN DW (KG): 84.8   Vital Signs: Temp: 97.9 F (36.6 C) (04/27 0400) Temp Source: Oral (04/27 0400) BP: 118/69 (04/27 0600) Pulse Rate: 55 (04/27 0600)  Labs: Recent Labs    06/19/17 1633 06/19/17 2114 06/20/17 0356  HGB 12.7*  --  11.1*  HCT 37.5*  --  33.3*  PLT 203  --  158  HEPARINUNFRC  --   --  <0.10*  CREATININE 0.86  --   --   TROPONINI 0.18* 0.17* 0.15*   Estimated Creatinine Clearance: 79.3 mL/min (by C-G formula based on SCr of 0.86 mg/dL).  Medical History: Past Medical History:  Diagnosis Date  . Diabetes mellitus without complication ([place])   . Hypertension     Medications:  Scheduled:  . apixaban  5 mg Oral BID  . aspirin EC  81 mg Oral q morning - 10a  . aspirin  325 mg Oral Once  . insulin aspart  0-9 Units Subcutaneous TID WC  . metoprolol succinate  100 mg Oral q morning - 10a   Infusions:  . diltiazem (CARDIZEM) infusion Stopped (06/19/17 2104)    Assessment: Pt on heparin gtt since yesterday for a fib and r/o ACS.  Switching to Eliquis this AM.  Heparin level at 0356 today was <0.1.  Starting Eliquis now.    Plan:  Eliquis 5mg  BID to start at 0800 today.  Preliminary review of pertinent patient information completed.  Forestine Na clinical pharmacist will complete review during morning rounds to assess the patient and finalize treatment regimen.  Cameo Shewell, [place], RPH 06/20/2017,7:17 AM \

## 2017-06-20 NOTE — Progress Notes (Signed)
*  PRELIMINARY RESULTS* Echocardiogram 2D Echocardiogram has been performed.  Andrew Humphrey 06/20/2017, 1:42 PM

## 2017-06-20 NOTE — Progress Notes (Signed)
Discharge instructions provide. Pt verbalized and demonstrated understanding. Waiting for ride to arrive.

## 2017-06-20 NOTE — Discharge Summary (Addendum)
Physician Discharge Summary  Andrew Humphrey VEH:[id number] DOB: 1940/06/11 DOA: 06/19/2017  PCP: Lanelle Bal, PA-C  Admit date: 06/19/2017 Discharge date: 06/20/2017  Admitted From: HOME  Disposition: HOME  Recommendations for Outpatient Follow-up:  1. Follow up with PCP in 1 weeks 2. Establish care with cardiology in 1-2 weeks 3. Please obtain BMP/CBC in one week 4. Please follow up on the following pending results:2 D echocardiogram  Discharge Condition: STABLE   CODE STATUS: FULL    Brief Hospitalization Summary: Please see all hospital notes, images, labs for full details of the hospitalization. From HPI: Andrew Humphrey is a 77 y.o. male with a history of diabetes, hypertension, history of GI bleed.  Patient presents with chest pain that started last night.  Last night he had diaphoresis and this morning chest pain and continued, so he was brought to the emergency department by his wife.  No palliating or provoking factors.  Chest pain is worsening.  Upon arrival, the patient noted that his chest pain started to improve.  On arrival, he was noted to be in A. fib with a heart rate in the 150s.  He notes that he never had any prior episodes of chest pain, palpitations, shortness of breath, etc.  Emergency Department Course: Monitoring shows A. fib with RVR.  Troponin elevated at 0.18.  Cards recommended admission here, rule out ACS with serial troponins and rate control.  Heparin was started.  Cardizem drip started.  Metoprolol IV push due to elevated heart rate.  Patient was started on a heparin infusion and it was given a dose of IV diltiazem and he rapidly converted to sinus rhythm after that.  He was never started on a diltiazem infusion.  He was monitored in the stepdown unit overnight.  He remained in sinus rhythm.  He was restarted on his home metoprolol XL 100 mg daily.  He was converted from IV heparin to Eliquis and was counseled by the pharmacy.  The patient received an  echocardiogram.  The patient wanted to go home.  He did not want to stay in the hospital any longer.  He said that he would follow-up outpatient with his primary care physician and cardiologist.  The patient had no further symptoms of chest discomfort, no palpitations, no chest pain no shortness of breath.  He felt baseline.  troponins trending down, felt secondary to demand ischemia associated with AFib RVR.  It was felt that he was safe to discharge home with outpatient follow-up.  He was given precautions to return if symptoms started to recur, worsen or new problems develop.  He verbalized understanding.  He was not going to remain in the hospital any longer.  I explained to the patient that he will need to follow-up his echocardiogram results with his primary care physician and when he follows up with the cardiology.  He verbalized understanding.  The patient was counseled to watch for signs of bleeding.  He was given some written information regarding Eliquis and thinks to monitor in terms of GI bleeding.  He verbalized understanding.  Chronic atrial fibrillation.   Discharge Diagnoses:  Principal Problem:   Atrial fibrillation with RVR (HCC) Active Problems:   History of GI bleed   Hypertension   Diabetes mellitus without complication (HCC)   Elevated troponin  Discharge Instructions: Discharge Instructions    Call MD for:  difficulty breathing, headache or visual disturbances   Complete by:  As directed    Call MD for:  extreme fatigue  Complete by:  As directed    Call MD for:  persistant dizziness or light-headedness   Complete by:  As directed    Increase activity slowly   Complete by:  As directed      Allergies as of 06/20/2017   No Known Allergies     Medication List    TAKE these medications   apixaban 5 MG Tabs tablet Commonly known as:  ELIQUIS Take 1 tablet (5 mg total) by mouth 2 (two) times daily.   aspirin EC 81 MG tablet Take 1 tablet (81 mg total) by mouth  every morning.   metFORMIN 1000 MG tablet Commonly known as:  GLUCOPHAGE Take 0.5 tablets by mouth daily.   metoprolol succinate 100 MG 24 hr tablet Commonly known as:  TOPROL-XL Take 100 mg by mouth every morning.      Follow-up Information    Lanelle Bal, PA-C. Schedule an appointment as soon as possible for a visit in 1 week(s).   Specialty:  Family Medicine Why:  Hospital Follow Up  Contact information: [place] [address] [phone]        Herminio Commons, MD. Schedule an appointment as soon as possible for a visit in 2 week(s).   Specialty:  Cardiology Why:  Hospital Follow Up Atrial Fibrillation Contact information: [place] [address] [phone]          No Known Allergies Allergies as of 06/20/2017   No Known Allergies     Medication List    TAKE these medications   apixaban 5 MG Tabs tablet Commonly known as:  ELIQUIS Take 1 tablet (5 mg total) by mouth 2 (two) times daily.   aspirin EC 81 MG tablet Take 1 tablet (81 mg total) by mouth every morning.   metFORMIN 1000 MG tablet Commonly known as:  GLUCOPHAGE Take 0.5 tablets by mouth daily.   metoprolol succinate 100 MG 24 hr tablet Commonly known as:  TOPROL-XL Take 100 mg by mouth every morning.       Procedures/Studies: Dg Chest Portable 1 View  Result Date: 06/19/2017 CLINICAL DATA:  Chest pain EXAM: PORTABLE CHEST 1 VIEW COMPARISON:  10/15/2014 FINDINGS: Borderline cardiomegaly with mild central congestion. No focal opacity or pleural effusion. No pneumothorax. IMPRESSION: Borderline to mild cardiomegaly with minimal central congestion. No acute focal airspace disease. Electronically Signed   By: Donavan Foil M.D.   On: 06/19/2017 16:59      Subjective: The patient is reporting that he is feeling much better and he is asking to go home.  Discharge Exam: Vitals:   06/20/17 1100 06/20/17 1200  BP: 137/86   Pulse: 64   Resp: 16   Temp:  98 F  (36.7 C)  SpO2: 95%    Vitals:   06/20/17 1000 06/20/17 1014 06/20/17 1100 06/20/17 1200  BP: 121/82 121/82 137/86   Pulse: 63 66 64   Resp: 17  16   Temp:    98 F (36.7 C)  TempSrc:    Oral  SpO2: 94%  95%   Weight:      Height:        General: Pt is alert, awake, not in acute distress Cardiovascular: Normal S1/S2 +, no rubs, no gallops Respiratory: CTA bilaterally, no wheezing, no rhonchi Abdominal: Soft, NT, ND, bowel sounds + Extremities: no edema, no cyanosis   The results of significant diagnostics from this hospitalization (including imaging, microbiology, ancillary and laboratory) are listed below for  reference.     Microbiology: Recent Results (from the past 240 hour(s))  MRSA PCR Screening     Status: None   Collection Time: 06/19/17  9:57 PM  Result Value Ref Range Status   MRSA by PCR NEGATIVE NEGATIVE Final    Comment:        The GeneXpert MRSA Assay (FDA approved for NASAL specimens only), is one component of a comprehensive MRSA colonization surveillance program. It is not intended to diagnose MRSA infection nor to guide or monitor treatment for MRSA infections. Performed at [facility], [address], [place], [place] [address]      Labs: BNP (last 3 results) No results for input(s): BNP in the last 8760 hours. Basic Metabolic Panel: Recent Labs  Lab 06/19/17 1633  NA 138  K 3.7  CL 106  CO2 19*  GLUCOSE 125*  BUN 18  CREATININE 0.86  CALCIUM 9.7   Liver Function Tests: Recent Labs  Lab 06/19/17 1633  AST 35  ALT 43  ALKPHOS 44  BILITOT 0.9  PROT 7.5  ALBUMIN 3.9   No results for input(s): LIPASE, AMYLASE in the last 168 hours. No results for input(s): AMMONIA in the last 168 hours. CBC: Recent Labs  Lab 06/19/17 1633 06/20/17 0356  WBC 11.7* 9.2  NEUTROABS 7.2  --   HGB 12.7* 11.1*  HCT 37.5* 33.3*  MCV 96.2 96.5  PLT 203 158   Cardiac Enzymes: Recent Labs  Lab 06/19/17 1633 06/19/17 2114 06/20/17 0356  06/20/17 1015  TROPONINI 0.18* 0.17* 0.15* 0.13*   BNP: Invalid input(s): POCBNP CBG: Recent Labs  Lab 06/20/17 0735 06/20/17 1132  GLUCAP 125* 110*   D-Dimer No results for input(s): DDIMER in the last 72 hours. Hgb A1c No results for input(s): HGBA1C in the last 72 hours. Lipid Profile No results for input(s): CHOL, HDL, LDLCALC, TRIG, CHOLHDL, LDLDIRECT in the last 72 hours. Thyroid function studies Recent Labs    06/20/17 0356  TSH 4.064   Anemia work up No results for input(s): VITAMINB12, FOLATE, FERRITIN, TIBC, IRON, RETICCTPCT in the last 72 hours. Urinalysis No results found for: COLORURINE, APPEARANCEUR, [place], [place], [place], [place], [place], [place], PROTEINUR, UROBILINOGEN, NITRITE, LEUKOCYTESUR Sepsis Labs Invalid input(s): PROCALCITONIN,  WBC,  LACTICIDVEN Microbiology Recent Results (from the past 240 hour(s))  MRSA PCR Screening     Status: None   Collection Time: 06/19/17  9:57 PM  Result Value Ref Range Status   MRSA by PCR NEGATIVE NEGATIVE Final    Comment:        The GeneXpert MRSA Assay (FDA approved for NASAL specimens only), is one component of a comprehensive MRSA colonization surveillance program. It is not intended to diagnose MRSA infection nor to guide or monitor treatment for MRSA infections. Performed at [facility], [address] [place], [place] [address]    Time coordinating discharge:   SIGNED:  Irwin Brakeman, MD  [facility] 06/20/2017, 1:43 PM Pager [phone]  If 7PM-7AM, please contact night-coverage [email or web address] Password [email or web address]

## 2017-06-30 DIAGNOSIS — Z6831 Body mass index (BMI) 31.0-31.9, adult: Secondary | ICD-10-CM | POA: Diagnosis not present

## 2017-06-30 DIAGNOSIS — I10 Essential (primary) hypertension: Secondary | ICD-10-CM | POA: Diagnosis not present

## 2017-06-30 DIAGNOSIS — I480 Paroxysmal atrial fibrillation: Secondary | ICD-10-CM | POA: Diagnosis not present

## 2017-06-30 DIAGNOSIS — I214 Non-ST elevation (NSTEMI) myocardial infarction: Secondary | ICD-10-CM | POA: Diagnosis not present

## 2017-06-30 DIAGNOSIS — R739 Hyperglycemia, unspecified: Secondary | ICD-10-CM | POA: Diagnosis not present

## 2017-07-21 DIAGNOSIS — E1165 Type 2 diabetes mellitus with hyperglycemia: Secondary | ICD-10-CM | POA: Diagnosis not present

## 2017-07-21 DIAGNOSIS — R739 Hyperglycemia, unspecified: Secondary | ICD-10-CM | POA: Diagnosis not present

## 2017-07-21 DIAGNOSIS — I214 Non-ST elevation (NSTEMI) myocardial infarction: Secondary | ICD-10-CM | POA: Diagnosis not present

## 2017-07-21 DIAGNOSIS — I480 Paroxysmal atrial fibrillation: Secondary | ICD-10-CM | POA: Diagnosis not present

## 2017-07-21 DIAGNOSIS — I10 Essential (primary) hypertension: Secondary | ICD-10-CM | POA: Diagnosis not present

## 2017-07-21 DIAGNOSIS — J0101 Acute recurrent maxillary sinusitis: Secondary | ICD-10-CM | POA: Diagnosis not present

## 2017-07-21 DIAGNOSIS — Z6831 Body mass index (BMI) 31.0-31.9, adult: Secondary | ICD-10-CM | POA: Diagnosis not present

## 2017-08-20 DIAGNOSIS — R739 Hyperglycemia, unspecified: Secondary | ICD-10-CM | POA: Diagnosis not present

## 2017-08-20 DIAGNOSIS — I214 Non-ST elevation (NSTEMI) myocardial infarction: Secondary | ICD-10-CM | POA: Diagnosis not present

## 2017-08-20 DIAGNOSIS — Z6831 Body mass index (BMI) 31.0-31.9, adult: Secondary | ICD-10-CM | POA: Diagnosis not present

## 2017-08-20 DIAGNOSIS — I10 Essential (primary) hypertension: Secondary | ICD-10-CM | POA: Diagnosis not present

## 2017-08-20 DIAGNOSIS — E1165 Type 2 diabetes mellitus with hyperglycemia: Secondary | ICD-10-CM | POA: Diagnosis not present

## 2017-08-20 DIAGNOSIS — I480 Paroxysmal atrial fibrillation: Secondary | ICD-10-CM | POA: Diagnosis not present

## 2017-11-02 DIAGNOSIS — I480 Paroxysmal atrial fibrillation: Secondary | ICD-10-CM | POA: Diagnosis not present

## 2017-11-02 DIAGNOSIS — I10 Essential (primary) hypertension: Secondary | ICD-10-CM | POA: Diagnosis not present

## 2017-11-02 DIAGNOSIS — R739 Hyperglycemia, unspecified: Secondary | ICD-10-CM | POA: Diagnosis not present

## 2017-11-02 DIAGNOSIS — I679 Cerebrovascular disease, unspecified: Secondary | ICD-10-CM | POA: Diagnosis not present

## 2017-11-02 DIAGNOSIS — E1165 Type 2 diabetes mellitus with hyperglycemia: Secondary | ICD-10-CM | POA: Diagnosis not present

## 2017-11-02 DIAGNOSIS — E611 Iron deficiency: Secondary | ICD-10-CM | POA: Diagnosis not present

## 2017-11-02 DIAGNOSIS — K219 Gastro-esophageal reflux disease without esophagitis: Secondary | ICD-10-CM | POA: Diagnosis not present

## 2017-11-02 DIAGNOSIS — R5383 Other fatigue: Secondary | ICD-10-CM | POA: Diagnosis not present

## 2017-11-04 DIAGNOSIS — E1165 Type 2 diabetes mellitus with hyperglycemia: Secondary | ICD-10-CM | POA: Diagnosis not present

## 2017-11-04 DIAGNOSIS — E611 Iron deficiency: Secondary | ICD-10-CM | POA: Diagnosis not present

## 2017-11-04 DIAGNOSIS — Z6831 Body mass index (BMI) 31.0-31.9, adult: Secondary | ICD-10-CM | POA: Diagnosis not present

## 2017-11-04 DIAGNOSIS — I10 Essential (primary) hypertension: Secondary | ICD-10-CM | POA: Diagnosis not present

## 2017-11-04 DIAGNOSIS — R739 Hyperglycemia, unspecified: Secondary | ICD-10-CM | POA: Diagnosis not present

## 2017-11-27 DIAGNOSIS — D649 Anemia, unspecified: Secondary | ICD-10-CM | POA: Diagnosis not present

## 2017-11-30 DIAGNOSIS — E1165 Type 2 diabetes mellitus with hyperglycemia: Secondary | ICD-10-CM | POA: Diagnosis not present

## 2017-11-30 DIAGNOSIS — Z6831 Body mass index (BMI) 31.0-31.9, adult: Secondary | ICD-10-CM | POA: Diagnosis not present

## 2017-11-30 DIAGNOSIS — Z1212 Encounter for screening for malignant neoplasm of rectum: Secondary | ICD-10-CM | POA: Diagnosis not present

## 2017-11-30 DIAGNOSIS — E611 Iron deficiency: Secondary | ICD-10-CM | POA: Diagnosis not present

## 2017-11-30 DIAGNOSIS — R739 Hyperglycemia, unspecified: Secondary | ICD-10-CM | POA: Diagnosis not present

## 2017-11-30 DIAGNOSIS — I10 Essential (primary) hypertension: Secondary | ICD-10-CM | POA: Diagnosis not present

## 2017-12-28 DIAGNOSIS — D649 Anemia, unspecified: Secondary | ICD-10-CM | POA: Diagnosis not present

## 2017-12-30 DIAGNOSIS — E1165 Type 2 diabetes mellitus with hyperglycemia: Secondary | ICD-10-CM | POA: Diagnosis not present

## 2017-12-30 DIAGNOSIS — Z6831 Body mass index (BMI) 31.0-31.9, adult: Secondary | ICD-10-CM | POA: Diagnosis not present

## 2017-12-30 DIAGNOSIS — I10 Essential (primary) hypertension: Secondary | ICD-10-CM | POA: Diagnosis not present

## 2017-12-30 DIAGNOSIS — E611 Iron deficiency: Secondary | ICD-10-CM | POA: Diagnosis not present

## 2017-12-30 DIAGNOSIS — J0101 Acute recurrent maxillary sinusitis: Secondary | ICD-10-CM | POA: Diagnosis not present

## 2017-12-30 DIAGNOSIS — R739 Hyperglycemia, unspecified: Secondary | ICD-10-CM | POA: Diagnosis not present

## 2018-03-26 DIAGNOSIS — I10 Essential (primary) hypertension: Secondary | ICD-10-CM | POA: Diagnosis not present

## 2018-03-26 DIAGNOSIS — E1165 Type 2 diabetes mellitus with hyperglycemia: Secondary | ICD-10-CM | POA: Diagnosis not present

## 2018-03-26 DIAGNOSIS — I214 Non-ST elevation (NSTEMI) myocardial infarction: Secondary | ICD-10-CM | POA: Diagnosis not present

## 2018-03-26 DIAGNOSIS — R739 Hyperglycemia, unspecified: Secondary | ICD-10-CM | POA: Diagnosis not present

## 2018-03-26 DIAGNOSIS — D649 Anemia, unspecified: Secondary | ICD-10-CM | POA: Diagnosis not present

## 2018-03-26 DIAGNOSIS — K219 Gastro-esophageal reflux disease without esophagitis: Secondary | ICD-10-CM | POA: Diagnosis not present

## 2018-03-30 DIAGNOSIS — Z6831 Body mass index (BMI) 31.0-31.9, adult: Secondary | ICD-10-CM | POA: Diagnosis not present

## 2018-03-30 DIAGNOSIS — Z0001 Encounter for general adult medical examination with abnormal findings: Secondary | ICD-10-CM | POA: Diagnosis not present

## 2018-03-30 DIAGNOSIS — I10 Essential (primary) hypertension: Secondary | ICD-10-CM | POA: Diagnosis not present

## 2018-10-25 DIAGNOSIS — I10 Essential (primary) hypertension: Secondary | ICD-10-CM | POA: Diagnosis not present

## 2018-10-25 DIAGNOSIS — E782 Mixed hyperlipidemia: Secondary | ICD-10-CM | POA: Diagnosis not present

## 2018-11-03 DIAGNOSIS — I10 Essential (primary) hypertension: Secondary | ICD-10-CM | POA: Diagnosis not present

## 2018-11-03 DIAGNOSIS — E1165 Type 2 diabetes mellitus with hyperglycemia: Secondary | ICD-10-CM | POA: Diagnosis not present

## 2018-11-03 DIAGNOSIS — R739 Hyperglycemia, unspecified: Secondary | ICD-10-CM | POA: Diagnosis not present

## 2018-11-03 DIAGNOSIS — Z6831 Body mass index (BMI) 31.0-31.9, adult: Secondary | ICD-10-CM | POA: Diagnosis not present

## 2018-11-03 DIAGNOSIS — E611 Iron deficiency: Secondary | ICD-10-CM | POA: Diagnosis not present

## 2018-11-08 DIAGNOSIS — E1165 Type 2 diabetes mellitus with hyperglycemia: Secondary | ICD-10-CM | POA: Diagnosis not present

## 2018-11-08 DIAGNOSIS — E611 Iron deficiency: Secondary | ICD-10-CM | POA: Diagnosis not present

## 2018-11-08 DIAGNOSIS — R5383 Other fatigue: Secondary | ICD-10-CM | POA: Diagnosis not present

## 2018-11-08 DIAGNOSIS — R739 Hyperglycemia, unspecified: Secondary | ICD-10-CM | POA: Diagnosis not present

## 2018-11-24 DIAGNOSIS — I10 Essential (primary) hypertension: Secondary | ICD-10-CM | POA: Diagnosis not present

## 2018-11-24 DIAGNOSIS — E782 Mixed hyperlipidemia: Secondary | ICD-10-CM | POA: Diagnosis not present

## 2019-01-24 DIAGNOSIS — R739 Hyperglycemia, unspecified: Secondary | ICD-10-CM | POA: Diagnosis not present

## 2019-01-24 DIAGNOSIS — I10 Essential (primary) hypertension: Secondary | ICD-10-CM | POA: Diagnosis not present

## 2019-02-15 DIAGNOSIS — E611 Iron deficiency: Secondary | ICD-10-CM | POA: Diagnosis not present

## 2019-02-15 DIAGNOSIS — Z6831 Body mass index (BMI) 31.0-31.9, adult: Secondary | ICD-10-CM | POA: Diagnosis not present

## 2019-02-15 DIAGNOSIS — I10 Essential (primary) hypertension: Secondary | ICD-10-CM | POA: Diagnosis not present

## 2019-02-15 DIAGNOSIS — R739 Hyperglycemia, unspecified: Secondary | ICD-10-CM | POA: Diagnosis not present

## 2019-02-15 DIAGNOSIS — E1165 Type 2 diabetes mellitus with hyperglycemia: Secondary | ICD-10-CM | POA: Diagnosis not present

## 2019-03-25 DIAGNOSIS — E1165 Type 2 diabetes mellitus with hyperglycemia: Secondary | ICD-10-CM | POA: Diagnosis not present

## 2019-03-25 DIAGNOSIS — E7849 Other hyperlipidemia: Secondary | ICD-10-CM | POA: Diagnosis not present

## 2019-03-25 DIAGNOSIS — I10 Essential (primary) hypertension: Secondary | ICD-10-CM | POA: Diagnosis not present

## 2019-05-10 DIAGNOSIS — R5383 Other fatigue: Secondary | ICD-10-CM | POA: Diagnosis not present

## 2019-05-10 DIAGNOSIS — I10 Essential (primary) hypertension: Secondary | ICD-10-CM | POA: Diagnosis not present

## 2019-05-10 DIAGNOSIS — I679 Cerebrovascular disease, unspecified: Secondary | ICD-10-CM | POA: Diagnosis not present

## 2019-05-10 DIAGNOSIS — I480 Paroxysmal atrial fibrillation: Secondary | ICD-10-CM | POA: Diagnosis not present

## 2019-05-10 DIAGNOSIS — K219 Gastro-esophageal reflux disease without esophagitis: Secondary | ICD-10-CM | POA: Diagnosis not present

## 2019-05-10 DIAGNOSIS — E1165 Type 2 diabetes mellitus with hyperglycemia: Secondary | ICD-10-CM | POA: Diagnosis not present

## 2019-05-13 DIAGNOSIS — Z0001 Encounter for general adult medical examination with abnormal findings: Secondary | ICD-10-CM | POA: Diagnosis not present

## 2019-05-13 DIAGNOSIS — E1165 Type 2 diabetes mellitus with hyperglycemia: Secondary | ICD-10-CM | POA: Diagnosis not present

## 2019-05-13 DIAGNOSIS — I10 Essential (primary) hypertension: Secondary | ICD-10-CM | POA: Diagnosis not present

## 2019-05-13 DIAGNOSIS — R1310 Dysphagia, unspecified: Secondary | ICD-10-CM | POA: Diagnosis not present

## 2019-05-13 DIAGNOSIS — Z6831 Body mass index (BMI) 31.0-31.9, adult: Secondary | ICD-10-CM | POA: Diagnosis not present

## 2019-05-13 DIAGNOSIS — E039 Hypothyroidism, unspecified: Secondary | ICD-10-CM | POA: Diagnosis not present

## 2019-05-25 DIAGNOSIS — E1165 Type 2 diabetes mellitus with hyperglycemia: Secondary | ICD-10-CM | POA: Diagnosis not present

## 2019-05-25 DIAGNOSIS — I10 Essential (primary) hypertension: Secondary | ICD-10-CM | POA: Diagnosis not present

## 2019-06-24 DIAGNOSIS — K219 Gastro-esophageal reflux disease without esophagitis: Secondary | ICD-10-CM | POA: Diagnosis not present

## 2019-06-24 DIAGNOSIS — I10 Essential (primary) hypertension: Secondary | ICD-10-CM | POA: Diagnosis not present

## 2019-10-25 DIAGNOSIS — E039 Hypothyroidism, unspecified: Secondary | ICD-10-CM | POA: Diagnosis not present

## 2019-10-25 DIAGNOSIS — E1165 Type 2 diabetes mellitus with hyperglycemia: Secondary | ICD-10-CM | POA: Diagnosis not present

## 2019-10-25 DIAGNOSIS — I10 Essential (primary) hypertension: Secondary | ICD-10-CM | POA: Diagnosis not present

## 2019-10-25 DIAGNOSIS — Z7984 Long term (current) use of oral hypoglycemic drugs: Secondary | ICD-10-CM | POA: Diagnosis not present

## 2019-11-21 DIAGNOSIS — E1165 Type 2 diabetes mellitus with hyperglycemia: Secondary | ICD-10-CM | POA: Diagnosis not present

## 2019-11-21 DIAGNOSIS — I10 Essential (primary) hypertension: Secondary | ICD-10-CM | POA: Diagnosis not present

## 2019-11-21 DIAGNOSIS — E039 Hypothyroidism, unspecified: Secondary | ICD-10-CM | POA: Diagnosis not present

## 2019-11-21 DIAGNOSIS — Z6830 Body mass index (BMI) 30.0-30.9, adult: Secondary | ICD-10-CM | POA: Diagnosis not present

## 2019-11-21 DIAGNOSIS — R1310 Dysphagia, unspecified: Secondary | ICD-10-CM | POA: Diagnosis not present

## 2019-11-24 DIAGNOSIS — E039 Hypothyroidism, unspecified: Secondary | ICD-10-CM | POA: Diagnosis not present

## 2019-11-24 DIAGNOSIS — E1165 Type 2 diabetes mellitus with hyperglycemia: Secondary | ICD-10-CM | POA: Diagnosis not present

## 2019-11-24 DIAGNOSIS — I10 Essential (primary) hypertension: Secondary | ICD-10-CM | POA: Diagnosis not present

## 2019-11-24 DIAGNOSIS — Z7984 Long term (current) use of oral hypoglycemic drugs: Secondary | ICD-10-CM | POA: Diagnosis not present

## 2019-12-02 ENCOUNTER — Emergency Department (HOSPITAL_COMMUNITY)
Admission: EM | Admit: 2019-12-02 | Discharge: 2019-12-02 | Disposition: A | Discharge status: Home / Self Care | Payer: Medicare HMO | Source: Home / Self Care | Attending: Emergency Medicine | Admitting: Emergency Medicine

## 2019-12-02 ENCOUNTER — Other Ambulatory Visit: Payer: Self-pay

## 2019-12-02 ENCOUNTER — Encounter (HOSPITAL_COMMUNITY): Payer: Self-pay | Admitting: *Deleted

## 2019-12-02 ENCOUNTER — Emergency Department (HOSPITAL_COMMUNITY): Payer: Medicare HMO

## 2019-12-02 DIAGNOSIS — R21 Rash and other nonspecific skin eruption: Secondary | ICD-10-CM | POA: Diagnosis not present

## 2019-12-02 DIAGNOSIS — Z7982 Long term (current) use of aspirin: Secondary | ICD-10-CM | POA: Insufficient documentation

## 2019-12-02 DIAGNOSIS — K760 Fatty (change of) liver, not elsewhere classified: Secondary | ICD-10-CM | POA: Diagnosis not present

## 2019-12-02 DIAGNOSIS — R079 Chest pain, unspecified: Secondary | ICD-10-CM | POA: Diagnosis not present

## 2019-12-02 DIAGNOSIS — E119 Type 2 diabetes mellitus without complications: Secondary | ICD-10-CM | POA: Insufficient documentation

## 2019-12-02 DIAGNOSIS — Z7901 Long term (current) use of anticoagulants: Secondary | ICD-10-CM | POA: Insufficient documentation

## 2019-12-02 DIAGNOSIS — R1012 Left upper quadrant pain: Secondary | ICD-10-CM | POA: Diagnosis not present

## 2019-12-02 DIAGNOSIS — Z79899 Other long term (current) drug therapy: Secondary | ICD-10-CM | POA: Insufficient documentation

## 2019-12-02 DIAGNOSIS — Z7984 Long term (current) use of oral hypoglycemic drugs: Secondary | ICD-10-CM | POA: Insufficient documentation

## 2019-12-02 DIAGNOSIS — I10 Essential (primary) hypertension: Secondary | ICD-10-CM | POA: Insufficient documentation

## 2019-12-02 DIAGNOSIS — A4159 Other Gram-negative sepsis: Secondary | ICD-10-CM | POA: Diagnosis not present

## 2019-12-02 LAB — COMPREHENSIVE METABOLIC PANEL
ALT: 39 U/L (ref 0–44)
AST: 29 U/L (ref 15–41)
Albumin: 3.8 g/dL (ref 3.5–5.0)
Alkaline Phosphatase: 67 U/L (ref 38–126)
Anion gap: 10 (ref 5–15)
BUN: 8 mg/dL (ref 8–23)
CO2: 20 mmol/L — ABNORMAL LOW (ref 22–32)
Calcium: 8.5 mg/dL — ABNORMAL LOW (ref 8.9–10.3)
Chloride: 99 mmol/L (ref 98–111)
Creatinine, Ser: 0.62 mg/dL (ref 0.61–1.24)
GFR, Estimated: >60 mL/min (ref >60)
Glucose, Bld: 249 mg/dL — ABNORMAL HIGH (ref 70–99)
Potassium: 3.6 mmol/L (ref 3.5–5.1)
Sodium: 129 mmol/L — ABNORMAL LOW (ref 135–145)
Total Bilirubin: 1.5 mg/dL — ABNORMAL HIGH (ref 0.3–1.2)
Total Protein: 7.3 g/dL (ref 6.5–8.1)

## 2019-12-02 LAB — CBC
HCT: 37.7 % — ABNORMAL LOW (ref 39.0–52.0)
Hemoglobin: 13 g/dL (ref 13.0–17.0)
MCH: 32.4 pg (ref 26.0–34.0)
MCHC: 34.5 g/dL (ref 30.0–36.0)
MCV: 94 fL (ref 80.0–100.0)
Platelets: 174 10*3/uL (ref 150–400)
RBC: 4.01 MIL/uL — ABNORMAL LOW (ref 4.22–5.81)
RDW: 19 % — ABNORMAL HIGH (ref 11.5–15.5)
WBC: 14.3 10*3/uL — ABNORMAL HIGH (ref 4.0–10.5)
nRBC: 0.1 % (ref 0.0–0.2)

## 2019-12-02 LAB — URINALYSIS, ROUTINE W REFLEX MICROSCOPIC
Bilirubin Urine: NEGATIVE
Glucose, UA: 50 mg/dL — AB
Hgb urine dipstick: NEGATIVE
Ketones, ur: NEGATIVE mg/dL
Leukocytes,Ua: NEGATIVE
Nitrite: NEGATIVE
Protein, ur: NEGATIVE mg/dL
Specific Gravity, Urine: >1.046 — ABNORMAL HIGH (ref 1.005–1.030)
pH: 6 (ref 5.0–8.0)

## 2019-12-02 LAB — TROPONIN I (HIGH SENSITIVITY)
Troponin I (High Sensitivity): 4 ng/L (ref <18)
Troponin I (High Sensitivity): 4 ng/L (ref <18)

## 2019-12-02 MED ORDER — IOHEXOL 300 MG/ML  SOLN
100.0000 mL | Freq: Once | INTRAMUSCULAR | Status: AC | PRN
  Administered 2019-12-02: 100 mL via INTRAVENOUS

## 2019-12-02 NOTE — ED Notes (Signed)
PT educated with DC instructions and verbalized complete understanding, denies questions at this time. Pt IV removed fully intact and dressing applied. PT self ambulated to exit with steady gait and wife to transport.

## 2019-12-02 NOTE — Discharge Instructions (Addendum)
Call your PCP on Monday for an appointment to be seen next week for further evaluation and treatment.  Asked him to order an ultrasound to look at your gallbladder.  Also get a general checkup.  In the meantime try to drink plenty of fluids and eat a simple diet.  Return here if you develop fever, uncontrollable vomiting or increasing pain.

## 2019-12-02 NOTE — ED Triage Notes (Signed)
Chest pain onset last night °

## 2019-12-02 NOTE — ED Notes (Signed)
Reports CP since last night  No prev px with heart  Pt points to Epigastric and LLQ as pain sites

## 2019-12-02 NOTE — ED Provider Notes (Signed)
Andrew Humphrey Arrival date & time: 12/02/19  1428     History Chief Complaint  Patient presents with  . Chest Pain    Andrew Humphrey is a 79 y.o. male.  HPI He presents for evaluation of left upper quadrant abdominal pain which started last night and has caused anorexia.  He has not had any vomiting.  He denies fever, chills, cough, chest pain, shortness of breath, weakness or dizziness.  He thinks he might be constipated.  Last bowel movement was several days ago.  There are no other known modifying factors.    Past Medical History:  Diagnosis Date  . Diabetes mellitus without complication ([place])   . Hypertension     Patient Active Problem List   Diagnosis Date Noted  . Atrial fibrillation with RVR ([place]) 06/19/2017  . Elevated troponin 06/19/2017  . Hypertension   . Diabetes mellitus without complication ([place])   . History of GI bleed 11/08/2016  . Acute blood loss anemia 11/08/2016  . Rectal bleeding     Past Surgical History:  Procedure Laterality Date  . COLONOSCOPY N/A 11/09/2016   Procedure: COLONOSCOPY;  Surgeon: Danie Binder, MD;  Location: [facility];  Service: Endoscopy;  Laterality: N/A;  . [place]  . TONSILLECTOMY         Family History  Problem Relation Age of Onset  . Colon cancer Neg Hx   . Colon polyps Neg Hx     Social History   Tobacco Use  . Smoking status: Never Smoker  . Smokeless tobacco: Never Used  Substance Use Topics  . Alcohol use: Yes    Comment: occasional beer  . Drug use: No    Home Medications Prior to Admission medications   Medication Sig Start Date End Date Taking? Authorizing Provider  aspirin EC 81 MG tablet Take 1 tablet (81 mg total) by mouth every morning. 11/14/16  Yes Abrol, Ascencion Dike, MD  glipiZIDE (GLUCOTROL XL) 10 MG 24 hr tablet Take 20 mg by mouth daily. 11/21/19  Yes [provider]  metFORMIN (GLUCOPHAGE) 1000 MG tablet  Take 1,000 mg by mouth in the morning and at bedtime.  06/12/17  Yes [provider]  metoprolol succinate (TOPROL-XL) 100 MG 24 hr tablet Take 100 mg by mouth every morning. 10/04/14  Yes [provider]  omeprazole (PRILOSEC) 40 MG capsule Take 40 mg by mouth as needed. Use prn for flare up 11/21/19  Yes [provider]  apixaban (ELIQUIS) 5 MG TABS tablet Take 1 tablet (5 mg total) by mouth 2 (two) times daily. 06/20/17 07/20/17  Murlean Iba, MD    Allergies    Patient has no known allergies.  Review of Systems   Review of Systems  All other systems reviewed and are negative.   Physical Exam Updated Vital Signs BP (!) 148/90 (BP Location: Left Arm)   Pulse 78   Temp 98.2 F (36.8 C) (Oral)   Resp (!) 22   Ht 5\' 7"  (1.702 m)   Wt 87.5 kg   SpO2 97%   BMI 30.23 kg/m   Physical Exam Vitals and nursing note reviewed.  Constitutional:      General: He is not in acute distress.    Appearance: He is well-developed. He is not ill-appearing, toxic-appearing or diaphoretic.  HENT:     Head: Normocephalic and atraumatic.     Right Ear: External ear normal.  Left Ear: External ear normal.     Mouth/Throat:     Mouth: Mucous membranes are moist.     Pharynx: No oropharyngeal exudate.  Eyes:     General: No scleral icterus.    Conjunctiva/sclera: Conjunctivae normal.     Pupils: Pupils are equal, round, and reactive to light.  Neck:     Trachea: Phonation normal.  Cardiovascular:     Rate and Rhythm: Normal rate and regular rhythm.     Heart sounds: Normal heart sounds.  Pulmonary:     Effort: Pulmonary effort is normal. No respiratory distress.     Breath sounds: Normal breath sounds. No stridor. No rhonchi.  Abdominal:     General: There is no distension.     Palpations: Abdomen is soft. There is no mass.     Tenderness: There is abdominal tenderness (Left upper quadrant, mild). There is no guarding.     Hernia: No hernia is present.   Musculoskeletal:        General: Normal range of motion.     Cervical back: Normal range of motion and neck supple.  Skin:    General: Skin is warm and dry.  Neurological:     Mental Status: He is alert and oriented to person, place, and time.     Cranial Nerves: No cranial nerve deficit.     Sensory: No sensory deficit.     Motor: No abnormal muscle tone.     Coordination: Coordination normal.  Psychiatric:        Mood and Affect: Mood normal.        Behavior: Behavior normal.        Thought Content: Thought content normal.        Judgment: Judgment normal.     ED Results / Procedures / Treatments   Labs (all labs ordered are listed, but only abnormal results are displayed) Labs Reviewed  CBC - Abnormal; Notable for the following components:      Result Value   WBC 14.3 (*)    RBC 4.01 (*)    HCT 37.7 (*)    RDW 19.0 (*)    All other components within normal limits  COMPREHENSIVE METABOLIC PANEL - Abnormal; Notable for the following components:   Sodium 129 (*)    CO2 20 (*)    Glucose, Bld 249 (*)    Calcium 8.5 (*)    Total Bilirubin 1.5 (*)    All other components within normal limits  URINALYSIS, ROUTINE W REFLEX MICROSCOPIC - Abnormal; Notable for the following components:   Specific Gravity, Urine >1.046 (*)    Glucose, UA 50 (*)    All other components within normal limits  TROPONIN I (HIGH SENSITIVITY)  TROPONIN I (HIGH SENSITIVITY)    EKG EKG Interpretation  Date/Time:  Friday December 02 2019 14:43:20 EDT Ventricular Rate:  77 PR Interval:    QRS Duration: 105 QT Interval:  402 QTC Calculation: 455 R Axis:   -54 Text Interpretation: Sinus rhythm Left anterior fascicular block Anterior infarct, old Baseline wander in lead(s) II III aVF since last tracing no significant change Confirmed by Daleen Bo [phone]) on 12/02/2019 4:24:57 PM   Radiology DG Chest 2 View  Result Date: 12/02/2019 CLINICAL DATA:  79 year old male with chest pain. EXAM:  CHEST - 2 VIEW COMPARISON:  Chest radiograph dated 06/19/2017. FINDINGS: Faint bilateral peripheral and subpleural interstitial densities may be chronic or represent atypical infection. Clinical correlation is recommended. No lobar consolidation, pleural effusion, pneumothorax.  Stable cardiac silhouette. No acute osseous pathology. IMPRESSION: Chronic changes versus atypical infection. No lobar consolidation. Electronically Signed   By: Anner Crete M.D.   On: 12/02/2019 15:37   CT Abdomen Pelvis W Contrast  Result Date: 12/02/2019 CLINICAL DATA:  Epigastric and left lower quadrant pain with chest pain onset last night EXAM: CT ABDOMEN AND PELVIS WITH CONTRAST TECHNIQUE: Multidetector CT imaging of the abdomen and pelvis was performed using the standard protocol following bolus administration of intravenous contrast. CONTRAST:  168mL OMNIPAQUE IOHEXOL 300 MG/ML  SOLN COMPARISON:  CT abdomen pelvis 10/09/2009 (report only, CT abdomen pelvis 01/24/2004 FINDINGS: Lower chest: Atelectatic changes are present in the lung bases. Additional subpleural reticular features are noted, incompletely assessed on this exam. No consolidation or pleural effusion. Normal heart size. No pericardial effusion. Three-vessel coronary artery calcifications. Calcifications of the aortic leaflets and mitral annulus as well. Hepatobiliary: Diffuse hepatic hypoattenuation compatible with hepatic steatosis. Sparing along the gallbladder fossa. No focal worrisome liver lesion. Mild pericholecystic inflammatory change particularly towards the neck of the gallbladder with moderate gallbladder distension. No visible calcified gallstone is seen. No biliary ductal dilatation or visible intraductal gallstones. Pancreas: Unremarkable. No pancreatic ductal dilatation or surrounding inflammatory changes. Spleen: Normal in size. No concerning splenic lesions. Adrenals/Urinary Tract: Normal adrenal glands. Kidneys are normally located with  symmetric enhancement and excretion. Few scattered subcentimeter hypoattenuating foci in both kidneys too small to fully characterize on CT imaging but statistically likely benign. Nonobstructing calculus seen in the lower pole left kidney measuring up to 3 mm in size. No suspicious renal lesion, obstructive urolithiasis or hydronephrosis. Urinary bladder is largely decompressed at the time of exam and therefore poorly evaluated by CT imaging. No acute bladder abnormality. Mild indentation of the bladder base by the prostate. Stomach/Bowel: Small sliding-type hiatal hernia. Distal stomach and duodenum are unremarkable. No small bowel thickening or dilatation. Thickening towards the hepatic flexure is favored to be reactive to the process in the gallbladder fossa. Distal colonic diverticulosis is noted. Some mild segmental thickening of the rectosigmoid is noted albeit not focally centered upon any particular culprit diverticulum. Minimal pericolonic haze. Could reflect a mild colitis versus sequela of prior inflammation. No evidence of obstruction. Appendix is not visualized. No focal inflammation the vicinity of the cecum to suggest an occult appendicitis. Vascular/Lymphatic: Atherosclerotic calcifications within the abdominal aorta and branch vessels. No aneurysm or ectasia. No enlarged abdominopelvic lymph nodes. Reproductive: Few calcifications within a borderline enlarged prostate. Seminal vesicles are unremarkable. Other: Inflammatory changes predominantly along the gallbladder fossa and inferior tip of the liver as well as minimally adjacent the rectosigmoid though this may be redistributed inflammatory change. No free air or fluid. No organized collection or abscess. Multilevel degenerative changes are present in the imaged portions of the spine. No acute osseous abnormality or suspicious osseous lesion. Musculoskeletal: Multilevel degenerative changes are present in the imaged portions of the spine. Minimal  retrolisthesis L1 on L2 without pars defects. Possible subacute to chronic subcortical impaction fracture of anterosuperior endplate L3 with up to 83% height loss. No other acute or suspicious osseous abnormalities. Additional degenerative changes in the hips and pelvis. IMPRESSION: 1. Mild pericholecystic inflammatory change particularly towards the neck of the gallbladder with moderate gallbladder distension. No visible calcified gallstone is seen. Findings are concerning for acute cholecystitis. Recommend further evaluation with right upper quadrant ultrasound. 2. Some mild segmental thickening of the rectosigmoid is noted albeit not focally centered upon any particular culprit diverticulum. Minimal pericolonic haze. Could  reflect a mild colitis versus sequela of prior inflammation. Correlate with clinical symptoms and consider outpatient evaluation with colonoscopy if not recently performed. 3. Nonobstructing left nephrolithiasis. 4. Likely subacute to chronic subcortical impaction fracture of anterosuperior endplate L3 with up to 26% height loss. Correlate for point tenderness to exclude acuity. 5. Hepatic steatosis. 6. Aortic Atherosclerosis (ICD10-I70.0). Electronically Signed   By: Lovena Le M.D.   On: 12/02/2019 18:17    Procedures Procedures (including critical care time)  Medications Ordered in ED Medications  iohexol (OMNIPAQUE) 300 MG/ML solution 100 mL (100 mLs Intravenous Contrast Given 12/02/19 1713)    ED Course  I have reviewed the triage vital signs and the nursing notes.  Pertinent labs & imaging results that were available during my care of the patient were reviewed by me and considered in my medical decision making (see chart for details).    MDM Rules/Calculators/A&P                           Patient Vitals for the past 24 hrs:  BP Temp Temp src Pulse Resp SpO2 Height Weight  12/02/19 2030 (!) 148/90 98.2 F (36.8 C) Oral 78 (!) 22 97 % -- --  12/02/19 1900 130/76  98.4 F (36.9 C) Oral 71 20 96 % -- --  12/02/19 1600 122/74 -- -- 71 -- 97 % -- --  12/02/19 1545 -- -- -- 73 -- 99 % -- --  12/02/19 1530 118/82 -- -- 69 -- 96 % -- --  12/02/19 1500 134/77 -- -- 71 (!) 22 96 % -- --  12/02/19 1445 (!) 149/86 98.4 F (36.9 C) -- 76 20 95 % 5\' 7"  (1.702 m) 87.5 kg    8:15 PM Reevaluation with update and discussion. After initial assessment and treatment, an updated evaluation reveals patient sitting on edge of bed, comfortable states he is ready go home.  I discussed the findings with the patient including possible abnormal gallbladder and colon findings on CT.  Both of these are relatively benign at this time he did not require hospitalization or further investigation.  They understand that this can require follow-up care and intervention as needed.  They will schedule appointment with his PCP next week.Daleen Bo   Medical Decision Making:  This patient is presenting for evaluation of abdominal pain, which does require a range of treatment options, and is a complaint that involves a moderate risk of morbidity and mortality. The differential diagnoses include gastritis, gastroenteritis, acute intra-abdominal infection. I decided to review old records, and in summary elderly male with symptoms for few days, nontoxic in appearance.  I obtained additional historical information from his wife at the bedside.  Clinical Laboratory Tests Ordered, included CBC, Metabolic panel, Urinalysis and Troponin. Review indicates sodium low, glucose elevated, specific gravity high, white count high. Radiologic Tests Ordered, included chest x-ray, CT abdomen pelvis.  I independently Visualized: Radiographic images, which show nonspecific pulmonary images, chronic changes versus inflammatory infectious process.  CT abdomen pelvis with gallbladder dilatation, nonspecific appearance without significant evidence for cholecystitis.  Also possible colitis, however nonspecific  appearance.    Critical Interventions-clinical evaluation, laboratory testing, radiographic imaging, observation reassessment  After These Interventions, the Patient was reevaluated and was found to have resolution of symptoms and feel comfortable.  Findings discussed with patient and his wife in the room, all questions were answered.  Patient understands he has some abnormalities which will need follow-up including CT imaging  abnormalities and chest x-ray.  No indication for hospitalization at this time.  CRITICAL CARE-no Performed by: Daleen Bo  Nursing Notes Reviewed/ Care Coordinated Applicable Imaging Reviewed Interpretation of Laboratory Data incorporated into ED treatment  The patient appears reasonably screened and/or stabilized for discharge and I doubt any other medical condition or other [facility] requiring further screening, evaluation, or treatment in the ED at this time prior to discharge.  Plan: Home Medications-continue usual medicine and take symptomatic treatments with OTC medicine of choice; Home Treatments-gradually advance diet and activity; return here if the recommended treatment, does not improve the symptoms; Recommended follow up-PCP follow-up next week and as needed     Final Clinical Impression(s) / ED Diagnoses Final diagnoses:  Left upper quadrant abdominal pain    Rx / DC Orders ED Discharge Orders    None       Daleen Bo, MD 12/02/19 2324

## 2019-12-05 ENCOUNTER — Emergency Department (HOSPITAL_COMMUNITY): Payer: Medicare HMO

## 2019-12-05 ENCOUNTER — Encounter (HOSPITAL_COMMUNITY): Payer: Self-pay | Admitting: *Deleted

## 2019-12-05 ENCOUNTER — Inpatient Hospital Stay (HOSPITAL_COMMUNITY)
Admission: EM | Admit: 2019-12-05 | Discharge: 2019-12-09 | DRG: 871 | Disposition: A | Discharge status: Home / Self Care | Payer: Medicare HMO | Attending: Internal Medicine | Admitting: Internal Medicine

## 2019-12-05 ENCOUNTER — Other Ambulatory Visit: Payer: Self-pay

## 2019-12-05 DIAGNOSIS — E1165 Type 2 diabetes mellitus with hyperglycemia: Secondary | ICD-10-CM | POA: Diagnosis not present

## 2019-12-05 DIAGNOSIS — R6510 Systemic inflammatory response syndrome (SIRS) of non-infectious origin without acute organ dysfunction: Secondary | ICD-10-CM | POA: Diagnosis not present

## 2019-12-05 DIAGNOSIS — K8020 Calculus of gallbladder without cholecystitis without obstruction: Secondary | ICD-10-CM

## 2019-12-05 DIAGNOSIS — D696 Thrombocytopenia, unspecified: Secondary | ICD-10-CM | POA: Diagnosis not present

## 2019-12-05 DIAGNOSIS — K4020 Bilateral inguinal hernia, without obstruction or gangrene, not specified as recurrent: Secondary | ICD-10-CM | POA: Diagnosis present

## 2019-12-05 DIAGNOSIS — R159 Full incontinence of feces: Secondary | ICD-10-CM | POA: Diagnosis present

## 2019-12-05 DIAGNOSIS — R079 Chest pain, unspecified: Secondary | ICD-10-CM | POA: Diagnosis not present

## 2019-12-05 DIAGNOSIS — R0902 Hypoxemia: Secondary | ICD-10-CM | POA: Diagnosis not present

## 2019-12-05 DIAGNOSIS — Z7984 Long term (current) use of oral hypoglycemic drugs: Secondary | ICD-10-CM

## 2019-12-05 DIAGNOSIS — K810 Acute cholecystitis: Secondary | ICD-10-CM | POA: Diagnosis not present

## 2019-12-05 DIAGNOSIS — R6521 Severe sepsis with septic shock: Secondary | ICD-10-CM | POA: Diagnosis present

## 2019-12-05 DIAGNOSIS — N179 Acute kidney failure, unspecified: Secondary | ICD-10-CM | POA: Diagnosis not present

## 2019-12-05 DIAGNOSIS — I10 Essential (primary) hypertension: Secondary | ICD-10-CM | POA: Diagnosis present

## 2019-12-05 DIAGNOSIS — K7689 Other specified diseases of liver: Secondary | ICD-10-CM | POA: Diagnosis not present

## 2019-12-05 DIAGNOSIS — E876 Hypokalemia: Secondary | ICD-10-CM | POA: Diagnosis present

## 2019-12-05 DIAGNOSIS — A4159 Other Gram-negative sepsis: Secondary | ICD-10-CM | POA: Diagnosis not present

## 2019-12-05 DIAGNOSIS — R0689 Other abnormalities of breathing: Secondary | ICD-10-CM | POA: Diagnosis not present

## 2019-12-05 DIAGNOSIS — I361 Nonrheumatic tricuspid (valve) insufficiency: Secondary | ICD-10-CM | POA: Diagnosis not present

## 2019-12-05 DIAGNOSIS — I480 Paroxysmal atrial fibrillation: Secondary | ICD-10-CM | POA: Diagnosis not present

## 2019-12-05 DIAGNOSIS — K828 Other specified diseases of gallbladder: Secondary | ICD-10-CM | POA: Diagnosis not present

## 2019-12-05 DIAGNOSIS — B961 Klebsiella pneumoniae [K. pneumoniae] as the cause of diseases classified elsewhere: Secondary | ICD-10-CM | POA: Diagnosis present

## 2019-12-05 DIAGNOSIS — K449 Diaphragmatic hernia without obstruction or gangrene: Secondary | ICD-10-CM | POA: Diagnosis not present

## 2019-12-05 DIAGNOSIS — E871 Hypo-osmolality and hyponatremia: Secondary | ICD-10-CM | POA: Diagnosis present

## 2019-12-05 DIAGNOSIS — Z8249 Family history of ischemic heart disease and other diseases of the circulatory system: Secondary | ICD-10-CM

## 2019-12-05 DIAGNOSIS — E869 Volume depletion, unspecified: Secondary | ICD-10-CM | POA: Diagnosis not present

## 2019-12-05 DIAGNOSIS — R410 Disorientation, unspecified: Secondary | ICD-10-CM | POA: Diagnosis not present

## 2019-12-05 DIAGNOSIS — R531 Weakness: Secondary | ICD-10-CM | POA: Diagnosis not present

## 2019-12-05 DIAGNOSIS — Z20822 Contact with and (suspected) exposure to covid-19: Secondary | ICD-10-CM | POA: Diagnosis not present

## 2019-12-05 DIAGNOSIS — R0602 Shortness of breath: Secondary | ICD-10-CM | POA: Diagnosis not present

## 2019-12-05 DIAGNOSIS — I340 Nonrheumatic mitral (valve) insufficiency: Secondary | ICD-10-CM | POA: Diagnosis not present

## 2019-12-05 DIAGNOSIS — I4891 Unspecified atrial fibrillation: Secondary | ICD-10-CM | POA: Diagnosis not present

## 2019-12-05 DIAGNOSIS — A419 Sepsis, unspecified organism: Secondary | ICD-10-CM | POA: Diagnosis not present

## 2019-12-05 DIAGNOSIS — Z79899 Other long term (current) drug therapy: Secondary | ICD-10-CM | POA: Diagnosis not present

## 2019-12-05 DIAGNOSIS — R110 Nausea: Secondary | ICD-10-CM | POA: Diagnosis not present

## 2019-12-05 DIAGNOSIS — J811 Chronic pulmonary edema: Secondary | ICD-10-CM | POA: Diagnosis not present

## 2019-12-05 DIAGNOSIS — K8689 Other specified diseases of pancreas: Secondary | ICD-10-CM | POA: Diagnosis not present

## 2019-12-05 DIAGNOSIS — K5730 Diverticulosis of large intestine without perforation or abscess without bleeding: Secondary | ICD-10-CM | POA: Diagnosis not present

## 2019-12-05 DIAGNOSIS — D649 Anemia, unspecified: Secondary | ICD-10-CM | POA: Diagnosis present

## 2019-12-05 HISTORY — DX: Type 2 diabetes mellitus without complications: E11.9

## 2019-12-05 HISTORY — DX: Essential (primary) hypertension: I10

## 2019-12-05 HISTORY — DX: Personal history of other diseases of the circulatory system: Z86.79

## 2019-12-05 HISTORY — DX: Anemia, unspecified: D64.9

## 2019-12-05 HISTORY — DX: Personal history of other diseases of the digestive system: Z87.19

## 2019-12-05 LAB — RESPIRATORY PANEL BY RT PCR (FLU A&B, COVID)
Influenza A by PCR: NEGATIVE
Influenza B by PCR: NEGATIVE
SARS Coronavirus 2 by RT PCR: NEGATIVE

## 2019-12-05 LAB — CBC WITH DIFFERENTIAL/PLATELET
Abs Immature Granulocytes: 0.9 10*3/uL — ABNORMAL HIGH (ref 0.00–0.07)
Basophils Absolute: 0.1 10*3/uL (ref 0.0–0.1)
Basophils Relative: 0 %
Eosinophils Absolute: 0.1 10*3/uL (ref 0.0–0.5)
Eosinophils Relative: 0 %
HCT: 33.5 % — ABNORMAL LOW (ref 39.0–52.0)
Hemoglobin: 11.5 g/dL — ABNORMAL LOW (ref 13.0–17.0)
Immature Granulocytes: 3 %
Lymphocytes Relative: 2 %
Lymphs Abs: 0.6 10*3/uL — ABNORMAL LOW (ref 0.7–4.0)
MCH: 32.9 pg (ref 26.0–34.0)
MCHC: 34.3 g/dL (ref 30.0–36.0)
MCV: 95.7 fL (ref 80.0–100.0)
Monocytes Absolute: 0.9 10*3/uL (ref 0.1–1.0)
Monocytes Relative: 3 %
Neutro Abs: 29.4 10*3/uL — ABNORMAL HIGH (ref 1.7–7.7)
Neutrophils Relative %: 92 %
Platelets: 168 10*3/uL (ref 150–400)
RBC: 3.5 MIL/uL — ABNORMAL LOW (ref 4.22–5.81)
RDW: 19.2 % — ABNORMAL HIGH (ref 11.5–15.5)
WBC: 32 10*3/uL — ABNORMAL HIGH (ref 4.0–10.5)
nRBC: 0.1 % (ref 0.0–0.2)

## 2019-12-05 LAB — URINALYSIS, ROUTINE W REFLEX MICROSCOPIC
Glucose, UA: >=500 mg/dL — AB
Hgb urine dipstick: NEGATIVE
Ketones, ur: NEGATIVE mg/dL
Leukocytes,Ua: NEGATIVE
Nitrite: NEGATIVE
Protein, ur: 30 mg/dL — AB
Specific Gravity, Urine: 1.02 (ref 1.005–1.030)
pH: 5 (ref 5.0–8.0)

## 2019-12-05 LAB — COMPREHENSIVE METABOLIC PANEL
ALT: 26 U/L (ref 0–44)
AST: 34 U/L (ref 15–41)
Albumin: 3 g/dL — ABNORMAL LOW (ref 3.5–5.0)
Alkaline Phosphatase: 70 U/L (ref 38–126)
Anion gap: 15 (ref 5–15)
BUN: 17 mg/dL (ref 8–23)
CO2: 16 mmol/L — ABNORMAL LOW (ref 22–32)
Calcium: 8.3 mg/dL — ABNORMAL LOW (ref 8.9–10.3)
Chloride: 102 mmol/L (ref 98–111)
Creatinine, Ser: 1.29 mg/dL — ABNORMAL HIGH (ref 0.61–1.24)
GFR, Estimated: 53 mL/min — ABNORMAL LOW (ref >60)
Glucose, Bld: 205 mg/dL — ABNORMAL HIGH (ref 70–99)
Potassium: 3.2 mmol/L — ABNORMAL LOW (ref 3.5–5.1)
Sodium: 133 mmol/L — ABNORMAL LOW (ref 135–145)
Total Bilirubin: 1.8 mg/dL — ABNORMAL HIGH (ref 0.3–1.2)
Total Protein: 6.5 g/dL (ref 6.5–8.1)

## 2019-12-05 LAB — LACTIC ACID, PLASMA
Lactic Acid, Venous: 5.4 mmol/L (ref 0.5–1.9)
Lactic Acid, Venous: 5 mmol/L (ref 0.5–1.9)

## 2019-12-05 LAB — GLUCOSE, CAPILLARY: Glucose-Capillary: 240 mg/dL — ABNORMAL HIGH (ref 70–99)

## 2019-12-05 LAB — HEMOGLOBIN A1C
Hgb A1c MFr Bld: 9.9 % — ABNORMAL HIGH (ref 4.8–5.6)
Mean Plasma Glucose: 237.43 mg/dL

## 2019-12-05 LAB — POC OCCULT BLOOD, ED: Fecal Occult Bld: POSITIVE — AB

## 2019-12-05 LAB — TROPONIN I (HIGH SENSITIVITY): Troponin I (High Sensitivity): 6 ng/L (ref <18)

## 2019-12-05 LAB — MRSA PCR SCREENING: MRSA by PCR: NEGATIVE

## 2019-12-05 LAB — LIPASE, BLOOD: Lipase: 19 U/L (ref 11–51)

## 2019-12-05 MED ORDER — ONDANSETRON HCL 4 MG PO TABS: 4.0000 mg | ORAL_TABLET | Freq: Four times a day (QID) | ORAL | Status: DC | PRN

## 2019-12-05 MED ORDER — ASPIRIN EC 81 MG PO TBEC
81.0000 mg | DELAYED_RELEASE_TABLET | Freq: Every morning | ORAL | Status: DC
  Administered 2019-12-06 – 2019-12-09 (×4): 81 mg via ORAL
  Filled 2019-12-05 (×4): qty 1

## 2019-12-05 MED ORDER — METRONIDAZOLE IN NACL 5-0.79 MG/ML-% IV SOLN
500.0000 mg | Freq: Three times a day (TID) | INTRAVENOUS | Status: DC
  Administered 2019-12-05 – 2019-12-06 (×3): 500 mg via INTRAVENOUS
  Filled 2019-12-05 (×3): qty 100

## 2019-12-05 MED ORDER — ACETAMINOPHEN 325 MG PO TABS
650.0000 mg | ORAL_TABLET | Freq: Four times a day (QID) | ORAL | Status: DC | PRN
  Administered 2019-12-07: 650 mg via ORAL
  Filled 2019-12-05 (×3): qty 2

## 2019-12-05 MED ORDER — ACETAMINOPHEN 650 MG RE SUPP
650.0000 mg | Freq: Four times a day (QID) | RECTAL | Status: DC | PRN
  Administered 2019-12-06: 650 mg via RECTAL
  Filled 2019-12-05: qty 1

## 2019-12-05 MED ORDER — METRONIDAZOLE IN NACL 5-0.79 MG/ML-% IV SOLN: 500.0000 mg | Freq: Three times a day (TID) | INTRAVENOUS | Status: DC

## 2019-12-05 MED ORDER — ENOXAPARIN SODIUM 40 MG/0.4ML ~~LOC~~ SOLN
40.0000 mg | SUBCUTANEOUS | Status: DC
  Filled 2019-12-05: qty 0.4

## 2019-12-05 MED ORDER — SODIUM CHLORIDE 0.9 % IV SOLN
2.0000 g | Freq: Once | INTRAVENOUS | Status: AC
  Administered 2019-12-05: 2 g via INTRAVENOUS
  Filled 2019-12-05: qty 2

## 2019-12-05 MED ORDER — VANCOMYCIN HCL IN DEXTROSE 1-5 GM/200ML-% IV SOLN: 1000.0000 mg | Freq: Once | INTRAVENOUS | Status: DC

## 2019-12-05 MED ORDER — ONDANSETRON HCL 4 MG/2ML IJ SOLN
4.0000 mg | Freq: Four times a day (QID) | INTRAMUSCULAR | Status: DC | PRN
  Administered 2019-12-06 – 2019-12-08 (×2): 4 mg via INTRAVENOUS
  Filled 2019-12-05 (×2): qty 2

## 2019-12-05 MED ORDER — LACTATED RINGERS IV SOLN: INTRAVENOUS | Status: AC

## 2019-12-05 MED ORDER — METRONIDAZOLE IN NACL 5-0.79 MG/ML-% IV SOLN
500.0000 mg | Freq: Once | INTRAVENOUS | Status: AC
  Administered 2019-12-05: 500 mg via INTRAVENOUS
  Filled 2019-12-05: qty 100

## 2019-12-05 MED ORDER — ACETAMINOPHEN 325 MG PO TABS
650.0000 mg | ORAL_TABLET | Freq: Once | ORAL | Status: AC
  Administered 2019-12-05: 650 mg via ORAL
  Filled 2019-12-05: qty 2

## 2019-12-05 MED ORDER — POTASSIUM CHLORIDE IN NACL 20-0.9 MEQ/L-% IV SOLN: INTRAVENOUS | Status: DC

## 2019-12-05 MED ORDER — LACTATED RINGERS IV BOLUS (SEPSIS)
1000.0000 mL | Freq: Once | INTRAVENOUS | Status: AC
  Administered 2019-12-05: 1000 mL via INTRAVENOUS

## 2019-12-05 MED ORDER — SODIUM CHLORIDE 0.9 % IV BOLUS
1000.0000 mL | Freq: Once | INTRAVENOUS | Status: AC
  Administered 2019-12-05: 1000 mL via INTRAVENOUS

## 2019-12-05 MED ORDER — VANCOMYCIN HCL 2000 MG/400ML IV SOLN
2000.0000 mg | Freq: Once | INTRAVENOUS | Status: AC
  Administered 2019-12-05: 2000 mg via INTRAVENOUS
  Filled 2019-12-05: qty 400

## 2019-12-05 MED ORDER — INSULIN ASPART 100 UNIT/ML ~~LOC~~ SOLN
0.0000 [IU] | Freq: Three times a day (TID) | SUBCUTANEOUS | Status: DC
  Administered 2019-12-06: 5 [IU] via SUBCUTANEOUS
  Administered 2019-12-06 – 2019-12-07 (×2): 3 [IU] via SUBCUTANEOUS
  Administered 2019-12-07 (×2): 5 [IU] via SUBCUTANEOUS
  Administered 2019-12-08 (×2): 8 [IU] via SUBCUTANEOUS
  Administered 2019-12-08: 5 [IU] via SUBCUTANEOUS
  Administered 2019-12-09: 3 [IU] via SUBCUTANEOUS
  Administered 2019-12-09: 2 [IU] via SUBCUTANEOUS

## 2019-12-05 NOTE — ED Notes (Signed)
Pt in radiology 

## 2019-12-05 NOTE — Progress Notes (Addendum)
Pharmacy Antibiotic Note  Andrew Humphrey is a 79 y.o. male admitted on 12/05/2019 with  Infection of unknown source.  Pharmacy has been consulted for vancomycin and cefepime  dosing.  Plan: Start cefepime 2g IV q12h Give vancomycin 2g  IV  x1 dose, then vancomycin 1.25g IV q24h  Goal vancomycin trough range:     15-20    mcg/mL Pharmacy will continue to monitor renal function, vancomycin troughs as clinically appropriate,  cultures and patient progress.   Height: 5\' 7"  (170.2 cm) Weight: 87.5 kg (193 lb) IBW/kg (Calculated) : 66.1  Temp (24hrs), Avg:99.6 F (37.6 C), Min:97.7 F (36.5 C), Max:101.4 F (38.6 C)  Recent Labs  Lab 12/02/19 1500 12/02/19 1503 12/05/19 1202 12/05/19 1203 12/05/19 1408  WBC 14.3*  --  32.0*  --   --   CREATININE  --  0.62 1.29*  --   --   LATICACIDVEN  --   --   --  5.4* 5.0*    Estimated Creatinine Clearance: 50.7 mL/min (A) (by C-G formula based on SCr of 1.29 mg/dL (H)).    No Known Allergies  Antimicrobials this admission: vancomycin 10/11 >>  cefepime 10/11>>  metronidazole 10/11>>   Microbiology results: 10/11 BC 2x:  10/11 UCx:   10/11 Resp PCR: SARS CoV-2 negative; Flu A/B negative 10/11 MRSA PCR:    Thank you for allowing pharmacy to be a part of this patient's care.  Despina Pole 12/05/2019 3:14 PM

## 2019-12-05 NOTE — ED Provider Notes (Signed)
Patient seen and examined, agree with assessment and plan by APP. Patient with rigors and weakness, fell out of bed this morning. Recently seen in the ED. Now febrile, unclear source. Plan admission for treatment.    Truddie Hidden, MD 12/05/19 1434

## 2019-12-05 NOTE — ED Triage Notes (Signed)
Pt states he fell off the bed and pt not able to state what happened.  [facility] reports that pt had been in the floor since last night.  Pt with dried emesis around mouth and dried feces as well. Pt denies taking his blood thinner Eliquis

## 2019-12-05 NOTE — ED Notes (Signed)
Date and time results received: 12/05/19 1432 (use smartphrase ".now" to insert current time)  Test: Lactic Acid Critical Value: 5.0  Name of Provider Notified: Krista Blue PA  Orders Received? Or Actions Taken?: NA

## 2019-12-05 NOTE — ED Notes (Signed)
hospitalist at bedside

## 2019-12-05 NOTE — ED Notes (Signed)
Pt gave verbal consent to discuss care plan with family. Pt daughter aware of current care plan and reported would notify her mother who is waiting in parking lot to come see pt when visitors are available to come back. Pt family aware pending covid results.

## 2019-12-05 NOTE — H&P (Signed)
History and Physical  Andrew Humphrey SWN:[id number] DOB: [birth year] DOA: 12/05/2019   PCP: Lanelle Bal, PA-C   Patient coming from: Home  Chief Complaint: generalized weakness  HPI:  Andrew Humphrey is a 79 y.o. male with medical history of atrial fibrillation, hypertension, diabetes mellitus type 2, GI bleed presenting with generalized weakness and fevers and chills.  The patient was initially seen in the emergency department on 12/02/2019 with left upper quadrant abdominal pain.  CT of the abdomen and pelvis on 1021 showed mild pericholecystic inflammatory changes toward the gallbladder neck and moderate gallbladder distention.  There was also some segmental thickening of the rectosigmoid colon with minimal pericolonic haze.  Patient was treated symptomatically and discharged home in stable condition.  On 12/03/2019 and 12/04/2019, the patient states that he was feeling well.  He had denied any fevers, chills, chest pain or shortness breath, coughing, hemoptysis, nausea, vomiting, diarrhea.  He states that his abdominal pain had actually improved.  The patient's significant other is also helping with the history.  Apparently, his significant other stated that he began having some mild abdominal cramping again on the evening of 12/04/2019 although the patient denies this.  The patient was eating fine over the weekend.  He denied headache or neck pain.  He does denies any new medications. On the morning of 12/05/2019, his significant other came over to check up on him and found him on the floor lethargic.  EMS was activated.  Apparently, the patient had bowel incontinence and emesis on him. In the emergency department, the patient is alert and oriented x4.  He actually denies any complaints presently including nausea, vomiting, diarrhea, domino pain, dysuria, hematuria.  He did remember being incontinent of stool, but stated that he was too weak to get up.  He denies any alcohol or illegal  drug use.  He does drink alcohol rarely.  In the emergency department, the patient was febrile to 101.4 F with hypotension with systolic blood pressure in the upper 80s.  BMP showed a sodium 133, potassium 3.2, serum creatinine 1.29.  LFTs were essentially unremarkable with lipase 19.  Total bilirubin was 1.8.  BBC 32.0, hemoglobin 9.5, platelets 168,000.  Right upper quadrant abdominal sono showed no gallstones or gallbladder wall thickening.  There was hepatic echogenicity.  There was fatty sparing adjacent to the gallbladder fossa.  Lactic acid was 5.4.The patient was given fluids and IV antibiotics.  Admission was advised for further treatment.  Assessment/Plan: SIRS -Source unclear at this time although suspect intra-abdominal -UA was not collected prior to giving antibiotics -Continue empiric vancomycin, cefepime, metronidazole pending culture data -Presently has no abdominal pain--repeat CT abdomen pelvis if abdominal pain recurs -Obtain UA if possible -Check procalcitonin  Hyponatremia -Secondary to volume depletion -Continue IV fluids  Acute kidney injury -Secondary to sepsis and volume depletion -Baseline creatinine 0.6-0.8 -Presented with serum creatinine 1.29  Hypokalemia -Replete -Check magnesium  Uncontrolled diabetes mellitus type 2 with hyperglycemia -Hemoglobin A1c -Holding glipizide and Metformin -NovoLog sliding scale       Past Medical History:  Diagnosis Date  . Diabetes mellitus without complication ([place])   . Hypertension    Past Surgical History:  Procedure Laterality Date  . COLONOSCOPY N/A 11/09/2016   Procedure: COLONOSCOPY;  Surgeon: Danie Binder, MD;  [facility];  Service: Endoscopy;  Laterality: N/A;  . [place]  . TONSILLECTOMY     Social History:  reports  that he has never smoked. He has never used smokeless tobacco. He reports current alcohol use. He reports that he does not use drugs.   Family  History  Problem Relation Age of Onset  . Colon cancer Neg Hx   . Colon polyps Neg Hx      No Known Allergies   Prior to Admission medications   Medication Sig Start Date End Date Taking? Authorizing Provider  aspirin EC 81 MG tablet Take 1 tablet (81 mg total) by mouth every morning. 11/14/16  Yes Abrol, Ascencion Dike, MD  glipiZIDE (GLUCOTROL XL) 10 MG 24 hr tablet Take 20 mg by mouth daily. 11/21/19  Yes [provider]  metFORMIN (GLUCOPHAGE) 1000 MG tablet Take 1,000 mg by mouth in the morning and at bedtime.  06/12/17  Yes [provider]  metoprolol succinate (TOPROL-XL) 100 MG 24 hr tablet Take 100 mg by mouth every morning. 10/04/14  Yes [provider]  omeprazole (PRILOSEC) 40 MG capsule Take 40 mg by mouth daily as needed (indigestion).  11/21/19  Yes [provider]  apixaban (ELIQUIS) 5 MG TABS tablet Take 1 tablet (5 mg total) by mouth 2 (two) times daily. Patient not taking: Reported on 12/05/2019 06/20/17 07/20/17  Murlean Iba, MD    Review of Systems:  Constitutional:  No weight loss, night sweats, Fevers, chills, fatigue.  Head&Eyes: No headache.  No vision loss.  No eye pain or scotoma ENT:  No Difficulty swallowing,Tooth/dental problems,Sore throat,  No ear ache, post nasal drip,  Cardio-vascular:  No chest pain, Orthopnea, PND, swelling in lower extremities,  dizziness, palpitations  GI:  No  abdominal pain, nausea, vomiting, diarrhea, loss of appetite, hematochezia, melena, heartburn, indigestion, Resp:  No shortness of breath with exertion or at rest. No cough. No coughing up of blood .No wheezing.No chest wall deformity  Skin:  no rash or lesions.  GU:  no dysuria, change in color of urine, no urgency or frequency. No flank pain.  Musculoskeletal:  No joint pain or swelling. No decreased range of motion. No back pain.  Psych:  No change in mood or affect. No depression or anxiety. Neurologic: No headache, no  dysesthesia, no focal weakness, no vision loss. No syncope  Physical Exam: Vitals:   12/05/19 1445 12/05/19 1500 12/05/19 1515 12/05/19 1530  BP: (!) 86/62 (!) 89/61 97/67 91/67   Pulse: 81 81 86 80  Resp: 19 17 18 17   Temp:      TempSrc:      SpO2: 95% 96% 98% 99%  Weight:      Height:       General:  A&O x 3, NAD, nontoxic, pleasant/cooperative Head/Eye: No conjunctival hemorrhage, no icterus, [place]/AT, No nystagmus ENT:  No icterus,  No thrush, good dentition, no pharyngeal exudate Neck:  No masses, no lymphadenpathy, no bruits CV:  RRR, no rub, no gallop, no S3 Lung:  Right basilar crackles. No wheeze Abdomen: soft/NT, +BS, nondistended, no peritoneal signs Ext: No cyanosis, No rashes, No petechiae, No lymphangitis, No edema Neuro: CNII-XII intact, strength 4/5 in bilateral upper and lower extremities, no dysmetria  Labs on Admission:  Basic Metabolic Panel: Recent Labs  Lab 12/02/19 1503 12/05/19 1202  NA 129* 133*  K 3.6 3.2*  CL 99 102  CO2 20* 16*  GLUCOSE 249* 205*  BUN 8 17  CREATININE 0.62 1.29*  CALCIUM 8.5* 8.3*   Liver Function Tests: Recent Labs  Lab 12/02/19 1503 12/05/19 1202  AST 29 34  ALT  39 26  ALKPHOS 67 70  BILITOT 1.5* 1.8*  PROT 7.3 6.5  ALBUMIN 3.8 3.0*   Recent Labs  Lab 12/05/19 1202  LIPASE 19   No results for input(s): AMMONIA in the last 168 hours. CBC: Recent Labs  Lab 12/02/19 1500 12/05/19 1202  WBC 14.3* 32.0*  NEUTROABS  --  29.4*  HGB 13.0 11.5*  HCT 37.7* 33.5*  MCV 94.0 95.7  PLT 174 168   Coagulation Profile: No results for input(s): INR, PROTIME in the last 168 hours. Cardiac Enzymes: No results for input(s): CKTOTAL, CKMB, CKMBINDEX, TROPONINI in the last 168 hours. BNP: Invalid input(s): POCBNP CBG: No results for input(s): GLUCAP in the last 168 hours. Urine analysis:    Component Value Date/Time   COLORURINE YELLOW 12/02/2019 1626   APPEARANCEUR CLEAR 12/02/2019 1626   LABSPEC >1.046 (H)  12/02/2019 1626   PHURINE 6.0 12/02/2019 1626   GLUCOSEU 50 (A) 12/02/2019 1626   HGBUR NEGATIVE 12/02/2019 1626   BILIRUBINUR NEGATIVE 12/02/2019 1626   KETONESUR NEGATIVE 12/02/2019 1626   PROTEINUR NEGATIVE 12/02/2019 1626   NITRITE NEGATIVE 12/02/2019 1626   LEUKOCYTESUR NEGATIVE 12/02/2019 1626   Sepsis Labs: @LABRCNTIP (procalcitonin:4,lacticidven:4) ) Recent Results (from the past 240 hour(s))  Respiratory Panel by RT PCR (Flu A&B, Covid) - Nasopharyngeal Swab     Status: None   Collection Time: 12/05/19 12:27 PM   Specimen: Nasopharyngeal Swab  Result Value Ref Range Status   SARS Coronavirus 2 by RT PCR NEGATIVE NEGATIVE Final    Comment: (NOTE) SARS-CoV-2 target nucleic acids are NOT DETECTED.  The SARS-CoV-2 RNA is generally detectable in upper respiratoy specimens during the acute phase of infection. The lowest concentration of SARS-CoV-2 viral copies this assay can detect is 131 copies/mL. A negative result does not preclude SARS-Cov-2 infection and should not be used as the sole basis for treatment or other patient management decisions. A negative result may occur with  improper specimen collection/handling, submission of specimen other than nasopharyngeal swab, presence of viral mutation(s) within the areas targeted by this assay, and inadequate number of viral copies (<131 copies/mL). A negative result must be combined with clinical observations, patient history, and epidemiological information. The expected result is Negative.  Fact Sheet for Patients:  PinkCheek.be  Fact Sheet for Healthcare Providers:  GravelBags.it  This test is no t yet approved or cleared by the [place] FDA and  has been authorized for detection and/or diagnosis of SARS-CoV-2 by FDA under an Emergency Use Authorization (EUA). This EUA will remain  in effect (meaning this test can be used) for the duration of the COVID-19  declaration under Section 564(b)(1) of the Act, 21 U.S.C. section 360bbb-3(b)(1), unless the authorization is terminated or revoked sooner.     Influenza A by PCR NEGATIVE NEGATIVE Final   Influenza B by PCR NEGATIVE NEGATIVE Final    Comment: (NOTE) The Xpert Xpress SARS-CoV-2/FLU/RSV assay is intended as an aid in  the diagnosis of influenza from Nasopharyngeal swab specimens and  should not be used as a sole basis for treatment. Nasal washings and  aspirates are unacceptable for Xpert Xpress SARS-CoV-2/FLU/RSV  testing.  Fact Sheet for Patients: PinkCheek.be  Fact Sheet for Healthcare Providers: GravelBags.it  This test is not yet approved or cleared by the [place] FDA and  has been authorized for detection and/or diagnosis of SARS-CoV-2 by  FDA under an Emergency Use Authorization (EUA). This EUA will remain  in effect (meaning this test can be used) for the  duration of the  Covid-19 declaration under Section 564(b)(1) of the Act, 21  U.S.C. section 360bbb-3(b)(1), unless the authorization is  terminated or revoked. Performed at [facility], [address] [place], [place] [address]      Radiological Exams on Admission: DG Chest 2 View  Result Date: 12/05/2019 CLINICAL DATA:  Fever, weakness. EXAM: CHEST - 2 VIEW COMPARISON:  December 02, 2019. FINDINGS: Stable cardiomegaly. No pneumothorax or pleural effusion is noted. Both lungs are clear. The visualized skeletal structures are unremarkable. IMPRESSION: No active cardiopulmonary disease. Electronically Signed   By: Marijo Conception M.D.   On: 12/05/2019 13:24   US Abdomen Limited  Result Date: 12/05/2019 CLINICAL DATA:  Right upper quadrant abdominal pain. EXAM: ULTRASOUND ABDOMEN LIMITED RIGHT UPPER QUADRANT COMPARISON:  December 02, 2019. FINDINGS: Gallbladder: No gallstones or wall thickening visualized. No sonographic Murphy sign noted by sonographer. Common  bile duct: Diameter: 2 mm which is within normal limits. Liver: Increased echogenicity of hepatic parenchyma is noted suggesting hepatic steatosis with probable fatty sparing adjacent to gallbladder fossa. Portal vein is patent on color Doppler imaging with normal direction of blood flow towards the liver. Other: None. IMPRESSION: Increased echogenicity of hepatic parenchyma is noted with probable fatty sparing adjacent to gallbladder fossa. Electronically Signed   By: Marijo Conception M.D.   On: 12/05/2019 13:49    EKG: Independently reviewed. Sinus, RSR'    Time spent:70 minutes Code Status:   FULL Family Communication:  Significant other updated Disposition Plan: expect 2-3 day hospitalization Consults called: none DVT Prophylaxis: [place] Lovenox  Andrew Eva, DO  [facility] Pager [phone]  If 7PM-7AM, please contact night-coverage [email or web address] Password [facility] 12/05/2019, 3:36 PM

## 2019-12-05 NOTE — ED Provider Notes (Signed)
[facility] EMERGENCY DEPARTMENT Provider Note   CSN: [id number] Arrival date & time: 12/05/19  1121     History Chief Complaint  Patient presents with  . Emesis    Andrew Humphrey is a 79 y.o. male with PMH significant for type II DM, HTN, and PAF not on anticoagulation who presents to the ED after sustaining a fall last evening.  Patient was evaluated in the ER on 12/02/2019 for left upper quadrant abdominal pain and was ultimately discharged after reassuring work-up.  On my examination, patient reports that he was watching the race last evening and then at approximately 10 PM he started feeling particularly fatigued.  He had the chills so bad last evening that he "shook himself out of bed" and landed on the floor.  He denies any obvious head injury or current headache.  He states that his girlfriend found him there on the floor this morning and called 911.  He states that he has been feeling very weak to the point that he defecated and urinated on himself because he did not have the energy to bring himself to the bathroom.  He states that he also had an episode of emesis that occurred last evening, cannot recall whether or not it was bloody.  He denies any blurred vision or dizziness, headache, congestion or significant cough, chest pain or shortness of breath, abdominal pain, urinary symptoms, or changes in bowel habits.  He has been immunized for COVID-19.  HPI     Past Medical History:  Diagnosis Date  . Diabetes mellitus without complication ([place])   . Hypertension     Patient Active Problem List   Diagnosis Date Noted  . Atrial fibrillation with RVR ([place]) 06/19/2017  . Elevated troponin 06/19/2017  . Hypertension   . Diabetes mellitus without complication ([place])   . History of GI bleed 11/08/2016  . Acute blood loss anemia 11/08/2016  . Rectal bleeding     Past Surgical History:  Procedure Laterality Date  . COLONOSCOPY N/A 11/09/2016   Procedure: COLONOSCOPY;   Surgeon: Danie Binder, MD;  Location: [facility];  Service: Endoscopy;  Laterality: N/A;  . [place]  . TONSILLECTOMY         Family History  Problem Relation Age of Onset  . Colon cancer Neg Hx   . Colon polyps Neg Hx     Social History   Tobacco Use  . Smoking status: Never Smoker  . Smokeless tobacco: Never Used  Substance Use Topics  . Alcohol use: Yes    Comment: occasional beer  . Drug use: No    Home Medications Prior to Admission medications   Medication Sig Start Date End Date Taking? Authorizing Provider  aspirin EC 81 MG tablet Take 1 tablet (81 mg total) by mouth every morning. 11/14/16  Yes Abrol, Ascencion Dike, MD  glipiZIDE (GLUCOTROL XL) 10 MG 24 hr tablet Take 20 mg by mouth daily. 11/21/19  Yes [provider]  metFORMIN (GLUCOPHAGE) 1000 MG tablet Take 1,000 mg by mouth in the morning and at bedtime.  06/12/17  Yes [provider]  metoprolol succinate (TOPROL-XL) 100 MG 24 hr tablet Take 100 mg by mouth every morning. 10/04/14  Yes [provider]  omeprazole (PRILOSEC) 40 MG capsule Take 40 mg by mouth daily as needed (indigestion).  11/21/19  Yes [provider]  apixaban (ELIQUIS) 5 MG TABS tablet Take 1 tablet (5 mg total) by mouth 2 (two)  times daily. Patient not taking: Reported on 12/05/2019 06/20/17 07/20/17  Murlean Iba, MD    Allergies    Patient has no known allergies.  Review of Systems   Review of Systems  All other systems reviewed and are negative.   Physical Exam Updated Vital Signs BP (!) 89/61   Pulse 81   Temp 97.7 F (36.5 C) (Oral)   Resp 17   Ht 5\' 7"  (1.702 m)   Wt 87.5 kg   SpO2 96%   BMI 30.23 kg/m   Physical Exam Vitals and nursing note reviewed. Exam conducted with a chaperone present.  Constitutional:      General: He is not in acute distress.    Appearance: He is ill-appearing.  HENT:     Head: Normocephalic and atraumatic.  Eyes:     General: No  scleral icterus.    Conjunctiva/sclera: Conjunctivae normal.  Cardiovascular:     Rate and Rhythm: Normal rate and regular rhythm.     Pulses: Normal pulses.     Heart sounds: Normal heart sounds.  Pulmonary:     Effort: Pulmonary effort is normal. No respiratory distress.     Breath sounds: Normal breath sounds. No wheezing or rales.     Comments: No significant increased work of breathing. Abdominal:     Comments: Soft, no significant distention.  Very mild tenderness in RUQ, no TTP elsewhere.  No guarding.  No overlying skin changes.  Musculoskeletal:     Cervical back: Normal range of motion. No rigidity.     Right lower leg: No edema.     Left lower leg: No edema.  Skin:    General: Skin is dry.     Capillary Refill: Capillary refill takes less than 2 seconds.     Comments: Area of ecchymoses in right subscapular region.  Neurological:     Mental Status: He is alert and oriented to person, place, and time.     GCS: GCS eye subscore is 4. GCS verbal subscore is 5. GCS motor subscore is 6.  Psychiatric:        Mood and Affect: Mood normal.        Behavior: Behavior normal.        Thought Content: Thought content normal.     ED Results / Procedures / Treatments   Labs (all labs ordered are listed, but only abnormal results are displayed) Labs Reviewed  CBC WITH DIFFERENTIAL/PLATELET - Abnormal; Notable for the following components:      Result Value   WBC 32.0 (*)    RBC 3.50 (*)    Hemoglobin 11.5 (*)    HCT 33.5 (*)    RDW 19.2 (*)    Neutro Abs 29.4 (*)    Lymphs Abs 0.6 (*)    Abs Immature Granulocytes 0.90 (*)    All other components within normal limits  COMPREHENSIVE METABOLIC PANEL - Abnormal; Notable for the following components:   Sodium 133 (*)    Potassium 3.2 (*)    CO2 16 (*)    Glucose, Bld 205 (*)    Creatinine, Ser 1.29 (*)    Calcium 8.3 (*)    Albumin 3.0 (*)    Total Bilirubin 1.8 (*)    GFR, Estimated 53 (*)    All other components within  normal limits  LACTIC ACID, PLASMA - Abnormal; Notable for the following components:   Lactic Acid, Venous 5.4 (*)    All other components within normal limits  LACTIC  ACID, PLASMA - Abnormal; Notable for the following components:   Lactic Acid, Venous 5.0 (*)    All other components within normal limits  POC OCCULT BLOOD, ED - Abnormal; Notable for the following components:   Fecal Occult Bld POSITIVE (*)    All other components within normal limits  RESPIRATORY PANEL BY RT PCR (FLU A&B, COVID)  CULTURE, BLOOD (ROUTINE X 2)  CULTURE, BLOOD (ROUTINE X 2)  URINE CULTURE  MRSA PCR SCREENING  LIPASE, BLOOD  URINALYSIS, ROUTINE W REFLEX MICROSCOPIC  TROPONIN I (HIGH SENSITIVITY)    EKG EKG Interpretation  Date/Time:  Monday December 05 2019 12:43:14 EDT Ventricular Rate:  91 PR Interval:    QRS Duration: 114 QT Interval:  372 QTC Calculation: 458 R Axis:   -47 Text Interpretation: Sinus rhythm Left anterior fascicular block RSR' in V1 or V2, probably normal variant Probable left ventricular hypertrophy No significant change since last tracing Confirmed by Calvert Cantor [id number]) on 12/05/2019 1:30:37 PM   Radiology DG Chest 2 View  Result Date: 12/05/2019 CLINICAL DATA:  Fever, weakness. EXAM: CHEST - 2 VIEW COMPARISON:  December 02, 2019. FINDINGS: Stable cardiomegaly. No pneumothorax or pleural effusion is noted. Both lungs are clear. The visualized skeletal structures are unremarkable. IMPRESSION: No active cardiopulmonary disease. Electronically Signed   By: Marijo Conception M.D.   On: 12/05/2019 13:24   US Abdomen Limited  Result Date: 12/05/2019 CLINICAL DATA:  Right upper quadrant abdominal pain. EXAM: ULTRASOUND ABDOMEN LIMITED RIGHT UPPER QUADRANT COMPARISON:  December 02, 2019. FINDINGS: Gallbladder: No gallstones or wall thickening visualized. No sonographic Murphy sign noted by sonographer. Common bile duct: Diameter: 2 mm which is within normal limits. Liver: Increased  echogenicity of hepatic parenchyma is noted suggesting hepatic steatosis with probable fatty sparing adjacent to gallbladder fossa. Portal vein is patent on color Doppler imaging with normal direction of blood flow towards the liver. Other: None. IMPRESSION: Increased echogenicity of hepatic parenchyma is noted with probable fatty sparing adjacent to gallbladder fossa. Electronically Signed   By: Marijo Conception M.D.   On: 12/05/2019 13:49    Procedures .Critical Care Performed by: Corena Herter, PA-C Authorized by: Corena Herter, PA-C   Critical care provider statement:    Critical care time (minutes):  45   Critical care was necessary to treat or prevent imminent or life-threatening deterioration of the following conditions:  Sepsis   Critical care was time spent personally by me on the following activities:  Discussions with consultants, evaluation of patient's response to treatment, examination of patient, ordering and performing treatments and interventions, ordering and review of laboratory studies, ordering and review of radiographic studies, pulse oximetry, re-evaluation of patient's condition, obtaining history from patient or surrogate and review of old charts   (including critical care time)  Medications Ordered in ED Medications  lactated ringers infusion (has no administration in time range)  lactated ringers bolus 1,000 mL (1,000 mLs Intravenous New Bag/Given 12/05/19 1435)  metroNIDAZOLE (FLAGYL) IVPB 500 mg (has no administration in time range)  vancomycin (VANCOREADY) IVPB 2000 mg/400 mL (has no administration in time range)  acetaminophen (TYLENOL) tablet 650 mg (650 mg Oral Given 12/05/19 1253)  sodium chloride 0.9 % bolus 1,000 mL (0 mLs Intravenous Stopped 12/05/19 1353)  ceFEPIme (MAXIPIME) 2 g in sodium chloride 0.9 % 100 mL IVPB (2 g Intravenous New Bag/Given 12/05/19 1435)    ED Course  I have reviewed the triage vital signs and the nursing  notes.  Pertinent labs & imaging results that were available during my care of the patient were reviewed by me and considered in my medical decision making (see chart for details).  Clinical Course as of Dec 04 1521  Mon Dec 05, 2019  1459 Spoke with hospitalist who will see and admit patient for sepsis work-up.   [GG]    Clinical Course User Index [GG] Corena Herter, PA-C   MDM Rules/Calculators/A&P                          Sepsis work-up given soft BP, borderline tachycardia, previously noted leukocytosis, and rectal temperature of 101.4 F.  Will obtain troponin as his acute onset weakness in the context of cardiovascular disease chest pain equivalent.  Given his hypotension to 92/63, will provide IV fluid resuscitation.  Labs CBC with differential: Leukocytosis to 32.0, worse from 14.3 labs obtained 3 days ago.  Hemoglobin of 11.5, down from 13.0 labs obtained 3 days ago. CMP: Mild hypokalemia 3.2.  Mildly elevated bilirubin to 1.8, increased from 1.5.  Mild renal impairment with creatinine elevated 1.29 and GFR reduced to 53. Fecal occult: Positive. Troponin: 6 Respiratory panel by PCR: In process. Lactic acid: 5.4 >> 5.0  Imaging DG chest 2 views personally reviewed and demonstrates no acute cardiopulmonary disease. US abdomen Limited is reviewed and without any clear evidence for acute cholecystitis or other emergent pathology.  Will start patient on cefepime, metronidazole, and vancomycin for sepsis and fever of unclear etiology.  UA still pending.  Patient received 1 L NS and 1 L LR.  Currently receiving 150 cc/h infusion of LR.  COVID-19 testing pending.  Will consult hospitalist for admission.  Spoke with hospitalist who will see and admit patient for sepsis work-up.   Final Clinical Impression(s) / ED Diagnoses Final diagnoses:  None    Rx / DC Orders ED Discharge Orders    None       Corena Herter, PA-C 12/05/19 1523    Truddie Hidden,  MD 12/06/19 [phone]

## 2019-12-05 NOTE — ED Notes (Signed)
CRITICAL VALUE ALERT  Critical Value:  Lactic 5.4  Date & Time Notied:  10/11 1404  Provider Notified: sheldon  Orders Received/Actions taken:

## 2019-12-05 NOTE — Progress Notes (Signed)
Notified provider of need to order repeat lactic acid @ 1600 . The one drawn @ 1408 was without proper fluids and antibiotics given

## 2019-12-05 NOTE — Progress Notes (Signed)
Notified bedside nurse of need to administer fluid bolus, pt needs 2625 cc.

## 2019-12-06 ENCOUNTER — Inpatient Hospital Stay (HOSPITAL_COMMUNITY): Payer: Medicare HMO

## 2019-12-06 LAB — CBC
HCT: 27.9 % — ABNORMAL LOW (ref 39.0–52.0)
Hemoglobin: 9.5 g/dL — ABNORMAL LOW (ref 13.0–17.0)
MCH: 33 pg (ref 26.0–34.0)
MCHC: 34.1 g/dL (ref 30.0–36.0)
MCV: 96.9 fL (ref 80.0–100.0)
Platelets: 124 10*3/uL — ABNORMAL LOW (ref 150–400)
RBC: 2.88 MIL/uL — ABNORMAL LOW (ref 4.22–5.81)
RDW: 19.2 % — ABNORMAL HIGH (ref 11.5–15.5)
WBC: 19.3 10*3/uL — ABNORMAL HIGH (ref 4.0–10.5)
nRBC: 0 % (ref 0.0–0.2)

## 2019-12-06 LAB — BLOOD CULTURE ID PANEL (REFLEXED) - BCID2

## 2019-12-06 LAB — COMPREHENSIVE METABOLIC PANEL
ALT: 36 U/L (ref 0–44)
AST: 47 U/L — ABNORMAL HIGH (ref 15–41)
Albumin: 2.5 g/dL — ABNORMAL LOW (ref 3.5–5.0)
Alkaline Phosphatase: 54 U/L (ref 38–126)
Anion gap: 9 (ref 5–15)
BUN: 19 mg/dL (ref 8–23)
CO2: 19 mmol/L — ABNORMAL LOW (ref 22–32)
Calcium: 7.5 mg/dL — ABNORMAL LOW (ref 8.9–10.3)
Chloride: 106 mmol/L (ref 98–111)
Creatinine, Ser: 0.82 mg/dL (ref 0.61–1.24)
GFR, Estimated: >60 mL/min (ref >60)
Glucose, Bld: 215 mg/dL — ABNORMAL HIGH (ref 70–99)
Potassium: 4.1 mmol/L (ref 3.5–5.1)
Sodium: 134 mmol/L — ABNORMAL LOW (ref 135–145)
Total Bilirubin: 0.9 mg/dL (ref 0.3–1.2)
Total Protein: 5.6 g/dL — ABNORMAL LOW (ref 6.5–8.1)

## 2019-12-06 LAB — URINE CULTURE: Culture: <10000 — AB

## 2019-12-06 LAB — GLUCOSE, CAPILLARY
Glucose-Capillary: 174 mg/dL — ABNORMAL HIGH (ref 70–99)
Glucose-Capillary: 203 mg/dL — ABNORMAL HIGH (ref 70–99)
Glucose-Capillary: 275 mg/dL — ABNORMAL HIGH (ref 70–99)

## 2019-12-06 LAB — PROCALCITONIN: Procalcitonin: 56.39 ng/mL

## 2019-12-06 LAB — D-DIMER, QUANTITATIVE: D-Dimer, Quant: 9.7 ug/mL-FEU — ABNORMAL HIGH (ref 0.00–0.50)

## 2019-12-06 MED ORDER — VANCOMYCIN HCL 1250 MG/250ML IV SOLN
1250.0000 mg | Freq: Two times a day (BID) | INTRAVENOUS | Status: DC
  Administered 2019-12-06: 1250 mg via INTRAVENOUS
  Filled 2019-12-06: qty 250

## 2019-12-06 MED ORDER — SODIUM CHLORIDE 0.9 % IV SOLN
2.0000 g | Freq: Three times a day (TID) | INTRAVENOUS | Status: DC
  Administered 2019-12-06 (×2): 2 g via INTRAVENOUS
  Filled 2019-12-06 (×2): qty 2

## 2019-12-06 MED ORDER — GADOBUTROL 1 MMOL/ML IV SOLN
10.0000 mL | Freq: Once | INTRAVENOUS | Status: AC | PRN
  Administered 2019-12-06: 10 mL via INTRAVENOUS

## 2019-12-06 MED ORDER — IOHEXOL 350 MG/ML SOLN
100.0000 mL | Freq: Once | INTRAVENOUS | Status: AC | PRN
  Administered 2019-12-06: 100 mL via INTRAVENOUS

## 2019-12-06 MED ORDER — SODIUM CHLORIDE 0.9 % IV SOLN
1.0000 g | Freq: Three times a day (TID) | INTRAVENOUS | Status: DC
  Administered 2019-12-06 – 2019-12-07 (×2): 1 g via INTRAVENOUS
  Filled 2019-12-06 (×2): qty 1

## 2019-12-06 MED ORDER — IOHEXOL 9 MG/ML PO SOLN
ORAL | Status: AC
  Filled 2019-12-06: qty 1000

## 2019-12-06 NOTE — Progress Notes (Addendum)
cta chest--no PE, no consolidation CT abd-- acute cholecystitis with interval increase in pericholecystic stranding when compared to prior imaging -MRCP--no choledocholithiasis  -general surgery consulted -discussed with Dr. Arnoldo Morale -blood cultures = BCID = Kleb oxytocia  -d/c vanc -d/c cefepime and flagyl -start imipenem pending final cultures  DTat

## 2019-12-06 NOTE — Progress Notes (Signed)
Pharmacy Antibiotic Note  Andrew Humphrey is a 79 y.o. male admitted on 12/05/2019 with  Infection of unknown source.  Pharmacy has been consulted for vancomycin and cefepime  dosing. CrCl improved Plan: Increase cefepime 2g IV q8h Increase vancomycin 1250mg  IV q12h  Goal vancomycin trough range:     15-20    mcg/mL Pharmacy will continue to monitor renal function, vancomycin troughs as clinically appropriate,  cultures and patient progress.   Height: 5\' 7"  (170.2 cm) Weight: 90.2 kg (198 lb 13.7 oz) IBW/kg (Calculated) : 66.1  Temp (24hrs), Avg:98.2 F (36.8 C), Min:97.4 F (36.3 C), Max:101.4 F (38.6 C)  Recent Labs  Lab 12/02/19 1500 12/02/19 1503 12/05/19 1202 12/05/19 1203 12/05/19 1408 12/06/19 0447  WBC 14.3*  --  32.0*  --   --  19.3*  CREATININE  --  0.62 1.29*  --   --  0.82  LATICACIDVEN  --   --   --  5.4* 5.0*  --     Estimated Creatinine Clearance: 80.8 mL/min (by C-G formula based on SCr of 0.82 mg/dL).    No Known Allergies  Antimicrobials this admission: vancomycin 10/11 >>  cefepime 10/11>>  metronidazole 10/11>>   Microbiology results: 10/11 BC 2x: pending 10/11 UCx:  pending 10/11 Resp PCR: SARS CoV-2 negative; Flu A/B negative 10/11 MRSA PCR:  negative  Thank you for allowing pharmacy to be a part of this patient's care.  Isac Sarna, BS Pharm D, [place] Clinical Pharmacist Pager [phone] 12/06/2019 8:52 AM

## 2019-12-06 NOTE — Consult Note (Addendum)
Reason for Consult: Abdominal pain, leukocytosis Referring Physician: [name] is an 79 y.o. male.  HPI: Patient is a 79 year old white male with a history of atrial fibrillation, hypertension, type 2 diabetes, previously on Eliquis who has been presented to the emergency room with varying upper abdominal pain.  Initially, he was complaining of left upper quadrant abdominal pain.  A CT scan of the abdomen showed mild pericholecystic inflammatory changes and moderate gallbladder distention for which she was treated symptomatically and sent home.  He then presented back to the hospital with worsening abdominal pain and possible right upper quadrant abdominal pain.  EMS was called as he was found to have bowel incontinence and emesis.  In the emergency room, sepsis protocol was started and he was febrile.  Work-up for his sepsis included ultrasound of the right upper quadrant which was unremarkable.  He was noted to have a significant leukocytosis at 32,000, with today's white blood cell count down to 19,000.  He had a CT angio which was negative for pulmonary embolus.  An MRI was performed which revealed mild pericholecystic stranding with gallbladder wall distention but no emphysematous changes or enhancement of the gallbladder wall.  No choledocholithiasis or cholelithiasis is seen.  When I went to see the patient, he states he is hungry and denies any upper abdominal pain, nausea, or vomiting.  Past Medical History:  Diagnosis Date  . Diabetes mellitus without complication ([place])   . Hypertension     Past Surgical History:  Procedure Laterality Date  . COLONOSCOPY N/A 11/09/2016   Procedure: COLONOSCOPY;  Surgeon: Danie Binder, MD;  Location: [facility];  Service: Endoscopy;  Laterality: N/A;  . [place]  . TONSILLECTOMY      Family History  Problem Relation Age of Onset  . Colon cancer Neg Hx   . Colon polyps Neg Hx     Social History:  reports that  he has never smoked. He has never used smokeless tobacco. He reports current alcohol use. He reports that he does not use drugs.  Allergies: No Known Allergies  Medications: I have reviewed the patient's current medications.  Results for orders placed or performed during the hospital encounter of 12/05/19 (from the past 48 hour(s))  POC occult blood, ED     Status: Abnormal   Collection Time: 12/05/19 11:36 AM  Result Value Ref Range   Fecal Occult Bld POSITIVE (A) NEGATIVE  CBC with Differential     Status: Abnormal   Collection Time: 12/05/19 12:02 PM  Result Value Ref Range   WBC 32.0 (H) 4.0 - 10.5 K/uL    Comment: WHITE COUNT CONFIRMED ON SMEAR   RBC 3.50 (L) 4.22 - 5.81 MIL/uL   Hemoglobin 11.5 (L) 13.0 - 17.0 g/dL   HCT 33.5 (L) 39 - 52 %   MCV 95.7 80.0 - 100.0 fL   MCH 32.9 26.0 - 34.0 pg   MCHC 34.3 30.0 - 36.0 g/dL   RDW 19.2 (H) 11.5 - 15.5 %   Platelets 168 150 - 400 K/uL    Comment: PLATELET COUNT CONFIRMED BY SMEAR SPECIMEN CHECKED FOR CLOTS    nRBC 0.1 0.0 - 0.2 %   Neutrophils Relative % 92 %   Neutro Abs 29.4 (H) 1.7 - 7.7 K/uL   Lymphocytes Relative 2 %   Lymphs Abs 0.6 (L) 0.7 - 4.0 K/uL   Monocytes Relative 3 %   Monocytes Absolute 0.9 0.1 -  1.0 K/uL   Eosinophils Relative 0 %   Eosinophils Absolute 0.1 0.0 - 0.5 K/uL   Basophils Relative 0 %   Basophils Absolute 0.1 0.0 - 0.1 K/uL   Immature Granulocytes 3 %   Abs Immature Granulocytes 0.90 (H) 0.00 - 0.07 K/uL    Comment: Performed at [facility], [address] [place], [place] [address]  Comprehensive metabolic panel     Status: Abnormal   Collection Time: 12/05/19 12:02 PM  Result Value Ref Range   Sodium 133 (L) 135 - 145 mmol/L   Potassium 3.2 (L) 3.5 - 5.1 mmol/L   Chloride 102 98 - 111 mmol/L   CO2 16 (L) 22 - 32 mmol/L   Glucose, Bld 205 (H) 70 - 99 mg/dL    Comment: Glucose reference range applies only to samples taken after fasting for at least 8 hours.   BUN 17 8 - 23 mg/dL    Creatinine, Ser 1.29 (H) 0.61 - 1.24 mg/dL   Calcium 8.3 (L) 8.9 - 10.3 mg/dL   Total Protein 6.5 6.5 - 8.1 g/dL   Albumin 3.0 (L) 3.5 - 5.0 g/dL   AST 34 15 - 41 U/L   ALT 26 0 - 44 U/L   Alkaline Phosphatase 70 38 - 126 U/L   Total Bilirubin 1.8 (H) 0.3 - 1.2 mg/dL   GFR, Estimated 53 (L) >60 mL/min   Anion gap 15 5 - 15    Comment: Performed at [facility], [address], [place], [place] [address]  Lipase, blood     Status: None   Collection Time: 12/05/19 12:02 PM  Result Value Ref Range   Lipase 19 11 - 51 U/L    Comment: Performed at [facility], [address], [place], [place] [address]  Lactic acid, plasma     Status: Abnormal   Collection Time: 12/05/19 12:03 PM  Result Value Ref Range   Lactic Acid, Venous 5.4 (HH) 0.5 - 1.9 mmol/L    Comment: CRITICAL RESULT CALLED TO, READ BACK BY AND VERIFIED WITH: ROBIN GENTRY,RN @1359  12/05/2019 KAY Performed at [facility], [address] [place], [place] [address]   Respiratory Panel by RT PCR (Flu A&B, Covid) - Nasopharyngeal Swab     Status: None   Collection Time: 12/05/19 12:27 PM   Specimen: Nasopharyngeal Swab  Result Value Ref Range   SARS Coronavirus 2 by RT PCR NEGATIVE NEGATIVE    Comment: (NOTE) SARS-CoV-2 target nucleic acids are NOT DETECTED.  The SARS-CoV-2 RNA is generally detectable in upper respiratoy specimens during the acute phase of infection. The lowest concentration of SARS-CoV-2 viral copies this assay can detect is 131 copies/mL. A negative result does not preclude SARS-Cov-2 infection and should not be used as the sole basis for treatment or other patient management decisions. A negative result may occur with  improper specimen collection/handling, submission of specimen other than nasopharyngeal swab, presence of viral mutation(s) within the areas targeted by this assay, and inadequate number of viral copies (<131 copies/mL). A negative result must be combined with clinical observations,  patient history, and epidemiological information. The expected result is Negative.  Fact Sheet for Patients:  PinkCheek.be  Fact Sheet for Healthcare Providers:  GravelBags.it  This test is no t yet approved or cleared by the [place] FDA and  has been authorized for detection and/or diagnosis of SARS-CoV-2 by FDA under an Emergency Use Authorization (EUA). This EUA will remain  in effect (meaning this test can be used)  for the duration of the COVID-19 declaration under Section 564(b)(1) of the Act, 21 U.S.C. section 360bbb-3(b)(1), unless the authorization is terminated or revoked sooner.     Influenza A by PCR NEGATIVE NEGATIVE   Influenza B by PCR NEGATIVE NEGATIVE    Comment: (NOTE) The Xpert Xpress SARS-CoV-2/FLU/RSV assay is intended as an aid in  the diagnosis of influenza from Nasopharyngeal swab specimens and  should not be used as a sole basis for treatment. Nasal washings and  aspirates are unacceptable for Xpert Xpress SARS-CoV-2/FLU/RSV  testing.  Fact Sheet for Patients: PinkCheek.be  Fact Sheet for Healthcare Providers: GravelBags.it  This test is not yet approved or cleared by the [place] FDA and  has been authorized for detection and/or diagnosis of SARS-CoV-2 by  FDA under an Emergency Use Authorization (EUA). This EUA will remain  in effect (meaning this test can be used) for the duration of the  Covid-19 declaration under Section 564(b)(1) of the Act, 21  U.S.C. section 360bbb-3(b)(1), unless the authorization is  terminated or revoked. Performed at [facility], [address] [place], [place] [address]   Troponin I (High Sensitivity)     Status: None   Collection Time: 12/05/19 12:31 PM  Result Value Ref Range   Troponin I (High Sensitivity) 6 <18 ng/L    Comment: (NOTE) Elevated high sensitivity troponin I (hsTnI) values and  significant  changes across serial measurements may suggest ACS but many other  chronic and acute conditions are known to elevate hsTnI results.  Refer to the "Links" section for chest pain algorithms and additional  guidance. Performed at [facility], [address], [place], [place] [address]   Hemoglobin A1c     Status: Abnormal   Collection Time: 12/05/19 12:31 PM  Result Value Ref Range   Hgb A1c MFr Bld 9.9 (H) 4.8 - 5.6 %    Comment: (NOTE) Pre diabetes:          5.7%-6.4%  Diabetes:              >6.4%  Glycemic control for   <7.0% adults with diabetes    Mean Plasma Glucose 237.43 mg/dL    Comment: Performed at [facility] [address] [place], [place] [address]  Blood culture (routine x 2)     Status: None (Preliminary result)   Collection Time: 12/05/19 12:53 PM   Specimen: BLOOD RIGHT FOREARM  Result Value Ref Range   Specimen Description BLOOD RIGHT FOREARM DRAWN BY RN    Special Requests      BOTTLES DRAWN AEROBIC AND ANAEROBIC Blood Culture results may not be optimal due to an excessive volume of blood received in culture bottles   Culture  Setup Time      GRAM NEGATIVE RODS WBC SEEN Gram Stain Report Called to,Read Back By and Verified With: SHANNON BROWN @1242  ON 10/12 BY TJ Performed at [facility], [address] [place], [place] [address]    Culture PENDING    Report Status PENDING   Urinalysis, Routine w reflex microscopic Urine, Random     Status: Abnormal   Collection Time: 12/05/19  1:47 PM  Result Value Ref Range   Color, Urine AMBER (A) YELLOW    Comment: BIOCHEMICALS MAY BE AFFECTED BY COLOR   APPearance CLOUDY (A) CLEAR   Specific Gravity, Urine 1.020 1.005 - 1.030   pH 5.0 5.0 - 8.0   Glucose, UA >=500 (A) NEGATIVE mg/dL   Hgb urine dipstick NEGATIVE NEGATIVE  Bilirubin Urine SMALL (A) NEGATIVE   Ketones, ur NEGATIVE NEGATIVE mg/dL   Protein, ur 30 (A) NEGATIVE mg/dL   Nitrite NEGATIVE NEGATIVE   Leukocytes,Ua NEGATIVE NEGATIVE    RBC / HPF 0-5 0 - 5 RBC/hpf   WBC, UA 21-50 0 - 5 WBC/hpf   Bacteria, UA RARE (A) NONE SEEN   Squamous Epithelial / LPF 0-5 0 - 5   Mucus PRESENT    Hyaline Casts, UA PRESENT     Comment: Performed at [facility], [address], [place], [place] [address]  Lactic acid, plasma     Status: Abnormal   Collection Time: 12/05/19  2:08 PM  Result Value Ref Range   Lactic Acid, Venous 5.0 (HH) 0.5 - 1.9 mmol/L    Comment: CRITICAL RESULT CALLED TO, READ BACK BY AND VERIFIED WITH: HEATHER CRAWFORD,RN @1431  12/05/2019  KAY Performed at [facility], [address], [place], [place] [address]   MRSA PCR Screening     Status: None   Collection Time: 12/05/19  3:16 PM   Specimen: Nasal Mucosa; Nasopharyngeal  Result Value Ref Range   MRSA by PCR NEGATIVE NEGATIVE    Comment:        The GeneXpert MRSA Assay (FDA approved for NASAL specimens only), is one component of a comprehensive MRSA colonization surveillance program. It is not intended to diagnose MRSA infection nor to guide or monitor treatment for MRSA infections. Performed at [facility], [address] [place], [place] [address]   Glucose, capillary     Status: Abnormal   Collection Time: 12/05/19 10:52 PM  Result Value Ref Range   Glucose-Capillary 240 (H) 70 - 99 mg/dL    Comment: Glucose reference range applies only to samples taken after fasting for at least 8 hours.  Comprehensive metabolic panel     Status: Abnormal   Collection Time: 12/06/19  4:47 AM  Result Value Ref Range   Sodium 134 (L) 135 - 145 mmol/L   Potassium 4.1 3.5 - 5.1 mmol/L    Comment: DELTA CHECK NOTED   Chloride 106 98 - 111 mmol/L   CO2 19 (L) 22 - 32 mmol/L   Glucose, Bld 215 (H) 70 - 99 mg/dL    Comment: Glucose reference range applies only to samples taken after fasting for at least 8 hours.   BUN 19 8 - 23 mg/dL   Creatinine, Ser 0.82 0.61 - 1.24 mg/dL   Calcium 7.5 (L) 8.9 - 10.3 mg/dL   Total Protein 5.6 (L) 6.5 - 8.1 g/dL   Albumin  2.5 (L) 3.5 - 5.0 g/dL   AST 47 (H) 15 - 41 U/L   ALT 36 0 - 44 U/L   Alkaline Phosphatase 54 38 - 126 U/L   Total Bilirubin 0.9 0.3 - 1.2 mg/dL   GFR, Estimated >60 >60 mL/min   Anion gap 9 5 - 15    Comment: Performed at [facility], [address] [place], [place] [address]  CBC     Status: Abnormal   Collection Time: 12/06/19  4:47 AM  Result Value Ref Range   WBC 19.3 (H) 4.0 - 10.5 K/uL   RBC 2.88 (L) 4.22 - 5.81 MIL/uL   Hemoglobin 9.5 (L) 13.0 - 17.0 g/dL   HCT 27.9 (L) 39 - 52 %   MCV 96.9 80.0 - 100.0 fL   MCH 33.0 26.0 - 34.0 pg   MCHC 34.1 30.0 - 36.0 g/dL   RDW 19.2 (H) 11.5 - 15.5 %  Platelets 124 (L) 150 - 400 K/uL   nRBC 0.0 0.0 - 0.2 %    Comment: Performed at [facility], [address], [place], [place] [address]  Glucose, capillary     Status: Abnormal   Collection Time: 12/06/19  8:20 AM  Result Value Ref Range   Glucose-Capillary 174 (H) 70 - 99 mg/dL    Comment: Glucose reference range applies only to samples taken after fasting for at least 8 hours.  Procalcitonin - Baseline     Status: None   Collection Time: 12/06/19 10:38 AM  Result Value Ref Range   Procalcitonin 56.39 ng/mL    Comment:        Interpretation: PCT >= 10 ng/mL: Important systemic inflammatory response, almost exclusively due to severe bacterial sepsis or septic shock. (NOTE)       Sepsis PCT Algorithm           Lower Respiratory Tract                                      Infection PCT Algorithm    ----------------------------     ----------------------------         PCT < 0.25 ng/mL                PCT < 0.10 ng/mL          Strongly encourage             Strongly discourage   discontinuation of antibiotics    initiation of antibiotics    ----------------------------     -----------------------------       PCT 0.25 - 0.50 ng/mL            PCT 0.10 - 0.25 ng/mL               OR       >80% decrease in PCT            Discourage initiation of                                             antibiotics      Encourage discontinuation           of antibiotics    ----------------------------     -----------------------------         PCT >= 0.50 ng/mL              PCT 0.26 - 0.50 ng/mL                AND       <80% decrease in PCT             Encourage initiation of                                             antibiotics       Encourage continuation           of antibiotics    ----------------------------     -----------------------------        PCT >= 0.50 ng/mL                  PCT > 0.50  ng/mL               AND         increase in PCT                  Strongly encourage                                      initiation of antibiotics    Strongly encourage escalation           of antibiotics                                     -----------------------------                                           PCT <= 0.25 ng/mL                                                 OR                                        > 80% decrease in PCT                                      Discontinue / Do not initiate                                             antibiotics  Performed at [facility], [address], [place], [place] [address]   D-dimer, quantitative (not at [facility])     Status: Abnormal   Collection Time: 12/06/19 10:38 AM  Result Value Ref Range   D-Dimer, Quant 9.70 (H) 0.00 - 0.50 ug/mL-FEU    Comment: (NOTE) At the manufacturer cut-off value of 0.5 g/mL FEU, this assay has a negative predictive value of 95-100%.This assay is intended for use in conjunction with a clinical pretest probability (PTP) assessment model to exclude pulmonary embolism (PE) and deep venous thrombosis (DVT) in outpatients suspected of PE or DVT. Results should be correlated with clinical presentation. Performed at [facility], [address] [place], [place] [address]     DG Chest 2 View  Result Date: 12/05/2019 CLINICAL DATA:  Fever, weakness. EXAM: CHEST - 2 VIEW COMPARISON:  December 02, 2019. FINDINGS: Stable cardiomegaly. No pneumothorax or pleural effusion is noted. Both lungs are clear. The visualized skeletal structures are unremarkable. IMPRESSION: No active cardiopulmonary disease. Electronically Signed   By: Marijo Conception M.D.   On: 12/05/2019 13:24   CT ANGIO CHEST PE W OR WO CONTRAST  Addendum Date: 12/06/2019   ADDENDUM REPORT: 12/06/2019 14:02 ADDENDUM: In addition of findings outlined previously there is a nodule in the RIGHT mid chest that may represent a perifissural lymph node though based  on size and in the acute setting. (This measures 8 mm) Non-contrast chest CT at 6-12 months is recommended. If the nodule is stable at time of repeat CT, then future CT at 18-24 months (from today's scan) is considered optional for low-risk patients, but is recommended for high-risk patients. This recommendation follows the consensus statement: Guidelines for Management of Incidental Pulmonary Nodules Detected on CT Images: From the Fleischner Society 2017; Radiology 2017; 284:228-243. These results will be called to the ordering clinician or representative by the Radiologist Assistant, and communication documented in the PACS or [employer]. Electronically Signed   By: Zetta Bills M.D.   On: 12/06/2019 14:02   Result Date: 12/06/2019 CLINICAL DATA:  Acute abdominal pain and shortness of breath. EXAM: CT ANGIOGRAPHY CHEST CT ABDOMEN AND PELVIS WITH CONTRAST TECHNIQUE: Multidetector CT imaging of the chest was performed using the standard protocol during bolus administration of intravenous contrast. Multiplanar CT image reconstructions and MIPs were obtained to evaluate the vascular anatomy. Multidetector CT imaging of the abdomen and pelvis was performed using the standard protocol during bolus administration of intravenous contrast. CONTRAST:  157mL OMNIPAQUE IOHEXOL 350 MG/ML SOLN COMPARISON:  Recent CT evaluation from 12/02/2019 FINDINGS: CTA CHEST FINDINGS Cardiovascular:  Scattered atherosclerosis without signs of aneurysm of the thoracic aorta. Heart size mildly enlarged without pericardial effusion. Three-vessel coronary artery disease. 3.2 cm main pulmonary artery. No central or lobar level pulmonary embolism. Segmental branches with limited assessment in particular in the lung bases due to respiratory motion and bolus timing. Mediastinum/Nodes: Esophagus is normal. Thoracic inlet structures are unremarkable. No axillary lymphadenopathy no mediastinal or hilar lymphadenopathy Lungs/Pleura: Mild septal thickening, signs of some subpleural reticulation. Evidence of basilar atelectasis. No lobar consolidation. No pleural effusion. Airways are patent. Peri fissural nodule measures 8 x 7 mm (image 75 of series 4) Musculoskeletal: No chest wall mass. No acute musculoskeletal process. No destructive bone finding spinal degenerative changes. Review of the MIP images confirms the above findings. CT ABDOMEN and PELVIS FINDINGS Hepatobiliary: Wall enhancement and pericholecystic stranding. Segmental enhancement and or increased density along the common bile duct. No intrahepatic biliary duct dilation. Pancreas: Pancreas with peripheral atrophy.  No ductal dilation. Spleen: Spleen normal in size and contour. Adrenals/Urinary Tract: Adrenal glands are normal. Symmetric renal enhancement.  No hydronephrosis. Stomach/Bowel: Small hiatal hernia. No acute small bowel process. Signs of diverticular disease. Stranding about the hepatic flexure of the colon is likely secondary to acute cholecystitis Vascular/Lymphatic: Calcified and noncalcified atheromatous plaque in the thoracic aorta extends into the abdominal aorta. No sign of aneurysm. There is no gastrohepatic or hepatoduodenal ligament lymphadenopathy. No retroperitoneal or mesenteric lymphadenopathy. No pelvic sidewall lymphadenopathy. Reproductive: Heterogeneous prostate similar to the prior exam. Other: Small to moderate fat containing  bilateral inguinal hernias and small fat containing umbilical hernia. Musculoskeletal: No acute musculoskeletal process. No destructive bone finding. Spinal degenerative changes. Review of the MIP images confirms the above findings. IMPRESSION: 1. No signs of central or lobar level pulmonary embolism. Segmental branches with limited assessment in particular in the lung bases due to respiratory motion and bolus timing. 2. Mild septal thickening, signs of some subpleural reticulation, may represent mild interstitial lung disease. 3. CT findings of acute cholecystitis with interval increase in pericholecystic stranding when compared to prior imaging. HIDA scan could be helpful for added specificity in the setting of acute cholecystitis if there is some doubt as to diagnosis based on clinical picture. 4. Segmental enhancement and or increased density along the common bile  duct may represent choledocholithiasis or cholangitis as it is slightly longer segment than expected for biliary calculi. No intrahepatic biliary duct dilation. Follow-up CT with contrast or MRI/MRCP may be helpful to exclude underlying lesion. 5. Signs of diverticular disease. 6. Small hiatal hernia. 7. Small to moderate fat containing bilateral inguinal hernias and small fat containing umbilical hernia. 8. Aortic atherosclerosis. Aortic Atherosclerosis (ICD10-I70.0). Electronically Signed: By: Zetta Bills M.D. On: 12/06/2019 13:39   CT ABDOMEN PELVIS W CONTRAST  Addendum Date: 12/06/2019   ADDENDUM REPORT: 12/06/2019 14:02 ADDENDUM: In addition of findings outlined previously there is a nodule in the RIGHT mid chest that may represent a perifissural lymph node though based on size and in the acute setting. (This measures 8 mm) Non-contrast chest CT at 6-12 months is recommended. If the nodule is stable at time of repeat CT, then future CT at 18-24 months (from today's scan) is considered optional for low-risk patients, but is recommended for  high-risk patients. This recommendation follows the consensus statement: Guidelines for Management of Incidental Pulmonary Nodules Detected on CT Images: From the Fleischner Society 2017; Radiology 2017; 284:228-243. These results will be called to the ordering clinician or representative by the Radiologist Assistant, and communication documented in the PACS or [employer]. Electronically Signed   By: Zetta Bills M.D.   On: 12/06/2019 14:02   Result Date: 12/06/2019 CLINICAL DATA:  Acute abdominal pain and shortness of breath. EXAM: CT ANGIOGRAPHY CHEST CT ABDOMEN AND PELVIS WITH CONTRAST TECHNIQUE: Multidetector CT imaging of the chest was performed using the standard protocol during bolus administration of intravenous contrast. Multiplanar CT image reconstructions and MIPs were obtained to evaluate the vascular anatomy. Multidetector CT imaging of the abdomen and pelvis was performed using the standard protocol during bolus administration of intravenous contrast. CONTRAST:  166mL OMNIPAQUE IOHEXOL 350 MG/ML SOLN COMPARISON:  Recent CT evaluation from 12/02/2019 FINDINGS: CTA CHEST FINDINGS Cardiovascular: Scattered atherosclerosis without signs of aneurysm of the thoracic aorta. Heart size mildly enlarged without pericardial effusion. Three-vessel coronary artery disease. 3.2 cm main pulmonary artery. No central or lobar level pulmonary embolism. Segmental branches with limited assessment in particular in the lung bases due to respiratory motion and bolus timing. Mediastinum/Nodes: Esophagus is normal. Thoracic inlet structures are unremarkable. No axillary lymphadenopathy no mediastinal or hilar lymphadenopathy Lungs/Pleura: Mild septal thickening, signs of some subpleural reticulation. Evidence of basilar atelectasis. No lobar consolidation. No pleural effusion. Airways are patent. Peri fissural nodule measures 8 x 7 mm (image 75 of series 4) Musculoskeletal: No chest wall mass. No acute  musculoskeletal process. No destructive bone finding spinal degenerative changes. Review of the MIP images confirms the above findings. CT ABDOMEN and PELVIS FINDINGS Hepatobiliary: Wall enhancement and pericholecystic stranding. Segmental enhancement and or increased density along the common bile duct. No intrahepatic biliary duct dilation. Pancreas: Pancreas with peripheral atrophy.  No ductal dilation. Spleen: Spleen normal in size and contour. Adrenals/Urinary Tract: Adrenal glands are normal. Symmetric renal enhancement.  No hydronephrosis. Stomach/Bowel: Small hiatal hernia. No acute small bowel process. Signs of diverticular disease. Stranding about the hepatic flexure of the colon is likely secondary to acute cholecystitis Vascular/Lymphatic: Calcified and noncalcified atheromatous plaque in the thoracic aorta extends into the abdominal aorta. No sign of aneurysm. There is no gastrohepatic or hepatoduodenal ligament lymphadenopathy. No retroperitoneal or mesenteric lymphadenopathy. No pelvic sidewall lymphadenopathy. Reproductive: Heterogeneous prostate similar to the prior exam. Other: Small to moderate fat containing bilateral inguinal hernias and small fat containing umbilical hernia. Musculoskeletal: No  acute musculoskeletal process. No destructive bone finding. Spinal degenerative changes. Review of the MIP images confirms the above findings. IMPRESSION: 1. No signs of central or lobar level pulmonary embolism. Segmental branches with limited assessment in particular in the lung bases due to respiratory motion and bolus timing. 2. Mild septal thickening, signs of some subpleural reticulation, may represent mild interstitial lung disease. 3. CT findings of acute cholecystitis with interval increase in pericholecystic stranding when compared to prior imaging. HIDA scan could be helpful for added specificity in the setting of acute cholecystitis if there is some doubt as to diagnosis based on clinical  picture. 4. Segmental enhancement and or increased density along the common bile duct may represent choledocholithiasis or cholangitis as it is slightly longer segment than expected for biliary calculi. No intrahepatic biliary duct dilation. Follow-up CT with contrast or MRI/MRCP may be helpful to exclude underlying lesion. 5. Signs of diverticular disease. 6. Small hiatal hernia. 7. Small to moderate fat containing bilateral inguinal hernias and small fat containing umbilical hernia. 8. Aortic atherosclerosis. Aortic Atherosclerosis (ICD10-I70.0). Electronically Signed: By: Zetta Bills M.D. On: 12/06/2019 13:39   US Abdomen Limited  Result Date: 12/05/2019 CLINICAL DATA:  Right upper quadrant abdominal pain. EXAM: ULTRASOUND ABDOMEN LIMITED RIGHT UPPER QUADRANT COMPARISON:  December 02, 2019. FINDINGS: Gallbladder: No gallstones or wall thickening visualized. No sonographic Murphy sign noted by sonographer. Common bile duct: Diameter: 2 mm which is within normal limits. Liver: Increased echogenicity of hepatic parenchyma is noted suggesting hepatic steatosis with probable fatty sparing adjacent to gallbladder fossa. Portal vein is patent on color Doppler imaging with normal direction of blood flow towards the liver. Other: None. IMPRESSION: Increased echogenicity of hepatic parenchyma is noted with probable fatty sparing adjacent to gallbladder fossa. Electronically Signed   By: Marijo Conception M.D.   On: 12/05/2019 13:49    ROS:  Pertinent items are noted in HPI.  Blood pressure 121/62, pulse (!) 115, temperature 99.3 F (37.4 C), resp. rate (!) 30, height 5\' 7"  (1.702 m), weight 90.2 kg, SpO2 95 %. Physical Exam: Pleasant white male no acute distress Head is normocephalic, atraumatic Eyes are without scleral icterus Lungs are clear to auscultation, though the patient is somewhat tachypneic. Somewhat tachycardic rhythm is noted. Abdomen is soft, nontender, nondistended.  I was able to barely  feel the liver edge, but I did not feel specifically the gallbladder.  No rigidity is noted.  MRI, CT, and ultrasound images all personally reviewed with radiology.  Assessment/Plan: Impression: Sepsis of unknown etiology.  Some of the studies are suggestive of a calculus cholecystitis.  His leukocytosis is resolving and the patient clinically is asymptomatic. Plan: We will reevaluate in the a.m.  Patient would not be a good candidate for general endotracheal anesthesia.  May need IR to place cholecystostomy tube should he have a persistent leukocytosis or transaminitis.  This was explained to the patient who understands and agrees.  Discussed with Dr. Carles Collet.  Aviva Signs 12/06/2019, 4:24 PM

## 2019-12-06 NOTE — Progress Notes (Signed)
PROGRESS NOTE  Andrew Humphrey KWI:[id number] DOB: August 11, 1940 DOA: 12/05/2019 PCP: Lanelle Bal, PA-C  Brief History:   79 y.o. male with medical history of atrial fibrillation, hypertension, diabetes mellitus type 2, GI bleed presenting with generalized weakness and fevers and chills.  The patient was initially seen in the emergency department on 12/02/2019 with left upper quadrant abdominal pain.  CT of the abdomen and pelvis on 1021 showed mild pericholecystic inflammatory changes toward the gallbladder neck and moderate gallbladder distention.  There was also some segmental thickening of the rectosigmoid colon with minimal pericolonic haze.  Patient was treated symptomatically and discharged home in stable condition.  On 12/03/2019 and 12/04/2019, the patient states that he was feeling well.  He had denied any fevers, chills, chest pain or shortness breath, coughing, hemoptysis, nausea, vomiting, diarrhea.  He states that his abdominal pain had actually improved.  The patient's significant other is also helping with the history.  Apparently, his significant other stated that he began having some mild abdominal cramping again on the evening of 12/04/2019 although the patient denies this.  The patient was eating fine over the weekend.  He denied headache or neck pain.  He does denies any new medications. On the morning of 12/05/2019, his significant other came over to check up on him and found him on the floor lethargic.  EMS was activated.  Apparently, the patient had bowel incontinence and emesis on him. In the emergency department, the patient is alert and oriented x4.  He actually denies any complaints presently including nausea, vomiting, diarrhea, domino pain, dysuria, hematuria.  He did remember being incontinent of stool, but stated that he was too weak to get up.  He denies any alcohol or illegal drug use.  He does drink alcohol rarely.  In the emergency department, the patient was  febrile to 101.4 F with hypotension with systolic blood pressure in the upper 80s.  BMP showed a sodium 133, potassium 3.2, serum creatinine 1.29.  LFTs were essentially unremarkable with lipase 19.  Total bilirubin was 1.8.  BBC 32.0, hemoglobin 9.5, platelets 168,000.  Right upper quadrant abdominal sono showed no gallstones or gallbladder wall thickening.  There was hepatic echogenicity.  There was fatty sparing adjacent to the gallbladder fossa.  Lactic acid was 5.4.The patient was given fluids and IV antibiotics.  Assessment/Plan: SIRS -Source unclear at this time although suspect intra-abdominal, aspiration pneumonitis and UTI -UA was not collected prior to giving antibiotics -Continue empiric vancomycin, cefepime, metronidazole pending culture data -Presently has no abdominal pain--repeat CT abdomen pelvis if abdominal pain recurs -Obtain UA--21-50 WBC, waiting for cultures -Check procalcitonin -12/06/19 AM--had chest pain, sob, abd pain lasting 30-60 min -obtain CT chest, abd/pelvis  Hyponatremia -Secondary to volume depletion -Continue IV fluids  Acute kidney injury -Secondary to sepsis and volume depletion -Baseline creatinine 0.6-0.8 -Presented with serum creatinine 1.29 -improving with IVF  Hypokalemia -Replete -Check magnesium  Uncontrolled diabetes mellitus type 2 with hyperglycemia -Hemoglobin A1c--9.9 -Holding glipizide and Metformin -NovoLog sliding scale       Status is: Inpatient  Remains inpatient appropriate because:IV treatments appropriate due to intensity of illness or inability to take PO   Dispo: The patient is from: Home              Anticipated d/c is to: Home              Anticipated d/c date is: 2 days  Patient currently is not medically stable to d/c.        Family Communication:  Significant other at bedside updated 10/12  Consultants:  none  Code Status:  FULL   DVT Prophylaxis:   [place]  Lovenox   Procedures: As Listed in Progress Note Above  Antibiotics: vanco 10/11>> Cefepime 10/11>> Metronidazole 10/11>>    Subjective: Patient complains of an episode of cp, sob, abd pain this am lasting about 1 hour.  Denies n/v/d, headache.  Objective: Vitals:   12/06/19 0006 12/06/19 0418 12/06/19 0550 12/06/19 0850  BP: (!) 94/56 94/60  (!) 112/54  Pulse: 70 72  84  Resp: 16 18  18   Temp: (!) 97.4 F (36.3 C)  (!) 97.5 F (36.4 C) 98 F (36.7 C)  TempSrc: Oral  Oral   SpO2: 97% 96%    Weight:      Height:        Intake/Output Summary (Last 24 hours) at 12/06/2019 1003 Last data filed at 12/06/2019 0852 Gross per 24 hour  Intake 1406.63 ml  Output 560 ml  Net 846.63 ml   Weight change:  Exam:   General:  Pt is alert, follows commands appropriately, not in acute distress  HEENT: No icterus, No thrush, No neck mass, [place]/AT  Cardiovascular: RRR, S1/S2, no rubs, no gallops  Respiratory: bibasilar crackles L>R  Abdomen: Soft/+BS, vague upper abd tender, non distended, no guarding  Extremities: No edema, No lymphangitis, No petechiae, No rashes, no synovitis   Data Reviewed: I have personally reviewed following labs and imaging studies Basic Metabolic Panel: Recent Labs  Lab 12/02/19 1503 12/05/19 1202 12/06/19 0447  NA 129* 133* 134*  K 3.6 3.2* 4.1  CL 99 102 106  CO2 20* 16* 19*  GLUCOSE 249* 205* 215*  BUN 8 17 19   CREATININE 0.62 1.29* 0.82  CALCIUM 8.5* 8.3* 7.5*   Liver Function Tests: Recent Labs  Lab 12/02/19 1503 12/05/19 1202 12/06/19 0447  AST 29 34 47*  ALT 39 26 36  ALKPHOS 67 70 54  BILITOT 1.5* 1.8* 0.9  PROT 7.3 6.5 5.6*  ALBUMIN 3.8 3.0* 2.5*   Recent Labs  Lab 12/05/19 1202  LIPASE 19   No results for input(s): AMMONIA in the last 168 hours. Coagulation Profile: No results for input(s): INR, PROTIME in the last 168 hours. CBC: Recent Labs  Lab 12/02/19 1500 12/05/19 1202 12/06/19 0447  WBC 14.3* 32.0*  19.3*  NEUTROABS  --  29.4*  --   HGB 13.0 11.5* 9.5*  HCT 37.7* 33.5* 27.9*  MCV 94.0 95.7 96.9  PLT 174 168 124*   Cardiac Enzymes: No results for input(s): CKTOTAL, CKMB, CKMBINDEX, TROPONINI in the last 168 hours. BNP: Invalid input(s): POCBNP CBG: Recent Labs  Lab 12/05/19 2252 12/06/19 0820  GLUCAP 240* 174*   HbA1C: Recent Labs    12/05/19 1231  HGBA1C 9.9*   Urine analysis:    Component Value Date/Time   COLORURINE AMBER (A) 12/05/2019 1347   APPEARANCEUR CLOUDY (A) 12/05/2019 1347   LABSPEC 1.020 12/05/2019 1347   PHURINE 5.0 12/05/2019 1347   GLUCOSEU >=500 (A) 12/05/2019 1347   HGBUR NEGATIVE 12/05/2019 1347   BILIRUBINUR SMALL (A) 12/05/2019 1347   KETONESUR NEGATIVE 12/05/2019 1347   PROTEINUR 30 (A) 12/05/2019 1347   NITRITE NEGATIVE 12/05/2019 1347   LEUKOCYTESUR NEGATIVE 12/05/2019 1347   Sepsis Labs: @LABRCNTIP (procalcitonin:4,lacticidven:4) ) Recent Results (from the past 240 hour(s))  Respiratory Panel by RT PCR (Flu A&B, Covid) -  Nasopharyngeal Swab     Status: None   Collection Time: 12/05/19 12:27 PM   Specimen: Nasopharyngeal Swab  Result Value Ref Range Status   SARS Coronavirus 2 by RT PCR NEGATIVE NEGATIVE Final    Comment: (NOTE) SARS-CoV-2 target nucleic acids are NOT DETECTED.  The SARS-CoV-2 RNA is generally detectable in upper respiratoy specimens during the acute phase of infection. The lowest concentration of SARS-CoV-2 viral copies this assay can detect is 131 copies/mL. A negative result does not preclude SARS-Cov-2 infection and should not be used as the sole basis for treatment or other patient management decisions. A negative result may occur with  improper specimen collection/handling, submission of specimen other than nasopharyngeal swab, presence of viral mutation(s) within the areas targeted by this assay, and inadequate number of viral copies (<131 copies/mL). A negative result must be combined with  clinical observations, patient history, and epidemiological information. The expected result is Negative.  Fact Sheet for Patients:  PinkCheek.be  Fact Sheet for Healthcare Providers:  GravelBags.it  This test is no t yet approved or cleared by the [place] FDA and  has been authorized for detection and/or diagnosis of SARS-CoV-2 by FDA under an Emergency Use Authorization (EUA). This EUA will remain  in effect (meaning this test can be used) for the duration of the COVID-19 declaration under Section 564(b)(1) of the Act, 21 U.S.C. section 360bbb-3(b)(1), unless the authorization is terminated or revoked sooner.     Influenza A by PCR NEGATIVE NEGATIVE Final   Influenza B by PCR NEGATIVE NEGATIVE Final    Comment: (NOTE) The Xpert Xpress SARS-CoV-2/FLU/RSV assay is intended as an aid in  the diagnosis of influenza from Nasopharyngeal swab specimens and  should not be used as a sole basis for treatment. Nasal washings and  aspirates are unacceptable for Xpert Xpress SARS-CoV-2/FLU/RSV  testing.  Fact Sheet for Patients: PinkCheek.be  Fact Sheet for Healthcare Providers: GravelBags.it  This test is not yet approved or cleared by the [place] FDA and  has been authorized for detection and/or diagnosis of SARS-CoV-2 by  FDA under an Emergency Use Authorization (EUA). This EUA will remain  in effect (meaning this test can be used) for the duration of the  Covid-19 declaration under Section 564(b)(1) of the Act, 21  U.S.C. section 360bbb-3(b)(1), unless the authorization is  terminated or revoked. Performed at [facility], [address], [place], [place] [address]   MRSA PCR Screening     Status: None   Collection Time: 12/05/19  3:16 PM   Specimen: Nasal Mucosa; Nasopharyngeal  Result Value Ref Range Status   MRSA by PCR NEGATIVE NEGATIVE Final     Comment:        The GeneXpert MRSA Assay (FDA approved for NASAL specimens only), is one component of a comprehensive MRSA colonization surveillance program. It is not intended to diagnose MRSA infection nor to guide or monitor treatment for MRSA infections. Performed at [facility], [address] [place], [place] [address]      Scheduled Meds: . aspirin EC  81 mg Oral q morning - 10a  . enoxaparin (LOVENOX) injection  40 mg Subcutaneous Q24H  . insulin aspart  0-15 Units Subcutaneous TID WC   Continuous Infusions: . 0.9 % NaCl with KCl 20 mEq / L 100 mL/hr at 12/05/19 2306  . ceFEPime (MAXIPIME) IV    . lactated ringers 150 mL/hr at 12/05/19 1601  . metronidazole 500 mg (12/06/19 0934)  . vancomycin  Procedures/Studies: DG Chest 2 View  Result Date: 12/05/2019 CLINICAL DATA:  Fever, weakness. EXAM: CHEST - 2 VIEW COMPARISON:  December 02, 2019. FINDINGS: Stable cardiomegaly. No pneumothorax or pleural effusion is noted. Both lungs are clear. The visualized skeletal structures are unremarkable. IMPRESSION: No active cardiopulmonary disease. Electronically Signed   By: Marijo Conception M.D.   On: 12/05/2019 13:24   DG Chest 2 View  Result Date: 12/02/2019 CLINICAL DATA:  79 year old male with chest pain. EXAM: CHEST - 2 VIEW COMPARISON:  Chest radiograph dated 06/19/2017. FINDINGS: Faint bilateral peripheral and subpleural interstitial densities may be chronic or represent atypical infection. Clinical correlation is recommended. No lobar consolidation, pleural effusion, pneumothorax. Stable cardiac silhouette. No acute osseous pathology. IMPRESSION: Chronic changes versus atypical infection. No lobar consolidation. Electronically Signed   By: Anner Crete M.D.   On: 12/02/2019 15:37   CT Abdomen Pelvis W Contrast  Result Date: 12/02/2019 CLINICAL DATA:  Epigastric and left lower quadrant pain with chest pain onset last night EXAM: CT ABDOMEN AND PELVIS WITH CONTRAST  TECHNIQUE: Multidetector CT imaging of the abdomen and pelvis was performed using the standard protocol following bolus administration of intravenous contrast. CONTRAST:  133mL OMNIPAQUE IOHEXOL 300 MG/ML  SOLN COMPARISON:  CT abdomen pelvis 10/09/2009 (report only, CT abdomen pelvis 01/24/2004 FINDINGS: Lower chest: Atelectatic changes are present in the lung bases. Additional subpleural reticular features are noted, incompletely assessed on this exam. No consolidation or pleural effusion. Normal heart size. No pericardial effusion. Three-vessel coronary artery calcifications. Calcifications of the aortic leaflets and mitral annulus as well. Hepatobiliary: Diffuse hepatic hypoattenuation compatible with hepatic steatosis. Sparing along the gallbladder fossa. No focal worrisome liver lesion. Mild pericholecystic inflammatory change particularly towards the neck of the gallbladder with moderate gallbladder distension. No visible calcified gallstone is seen. No biliary ductal dilatation or visible intraductal gallstones. Pancreas: Unremarkable. No pancreatic ductal dilatation or surrounding inflammatory changes. Spleen: Normal in size. No concerning splenic lesions. Adrenals/Urinary Tract: Normal adrenal glands. Kidneys are normally located with symmetric enhancement and excretion. Few scattered subcentimeter hypoattenuating foci in both kidneys too small to fully characterize on CT imaging but statistically likely benign. Nonobstructing calculus seen in the lower pole left kidney measuring up to 3 mm in size. No suspicious renal lesion, obstructive urolithiasis or hydronephrosis. Urinary bladder is largely decompressed at the time of exam and therefore poorly evaluated by CT imaging. No acute bladder abnormality. Mild indentation of the bladder base by the prostate. Stomach/Bowel: Small sliding-type hiatal hernia. Distal stomach and duodenum are unremarkable. No small bowel thickening or dilatation. Thickening  towards the hepatic flexure is favored to be reactive to the process in the gallbladder fossa. Distal colonic diverticulosis is noted. Some mild segmental thickening of the rectosigmoid is noted albeit not focally centered upon any particular culprit diverticulum. Minimal pericolonic haze. Could reflect a mild colitis versus sequela of prior inflammation. No evidence of obstruction. Appendix is not visualized. No focal inflammation the vicinity of the cecum to suggest an occult appendicitis. Vascular/Lymphatic: Atherosclerotic calcifications within the abdominal aorta and branch vessels. No aneurysm or ectasia. No enlarged abdominopelvic lymph nodes. Reproductive: Few calcifications within a borderline enlarged prostate. Seminal vesicles are unremarkable. Other: Inflammatory changes predominantly along the gallbladder fossa and inferior tip of the liver as well as minimally adjacent the rectosigmoid though this may be redistributed inflammatory change. No free air or fluid. No organized collection or abscess. Multilevel degenerative changes are present in the imaged portions of the spine. No acute osseous  abnormality or suspicious osseous lesion. Musculoskeletal: Multilevel degenerative changes are present in the imaged portions of the spine. Minimal retrolisthesis L1 on L2 without pars defects. Possible subacute to chronic subcortical impaction fracture of anterosuperior endplate L3 with up to 82% height loss. No other acute or suspicious osseous abnormalities. Additional degenerative changes in the hips and pelvis. IMPRESSION: 1. Mild pericholecystic inflammatory change particularly towards the neck of the gallbladder with moderate gallbladder distension. No visible calcified gallstone is seen. Findings are concerning for acute cholecystitis. Recommend further evaluation with right upper quadrant ultrasound. 2. Some mild segmental thickening of the rectosigmoid is noted albeit not focally centered upon any  particular culprit diverticulum. Minimal pericolonic haze. Could reflect a mild colitis versus sequela of prior inflammation. Correlate with clinical symptoms and consider outpatient evaluation with colonoscopy if not recently performed. 3. Nonobstructing left nephrolithiasis. 4. Likely subacute to chronic subcortical impaction fracture of anterosuperior endplate L3 with up to 80% height loss. Correlate for point tenderness to exclude acuity. 5. Hepatic steatosis. 6. Aortic Atherosclerosis (ICD10-I70.0). Electronically Signed   By: Lovena Le M.D.   On: 12/02/2019 18:17   US Abdomen Limited  Result Date: 12/05/2019 CLINICAL DATA:  Right upper quadrant abdominal pain. EXAM: ULTRASOUND ABDOMEN LIMITED RIGHT UPPER QUADRANT COMPARISON:  December 02, 2019. FINDINGS: Gallbladder: No gallstones or wall thickening visualized. No sonographic Murphy sign noted by sonographer. Common bile duct: Diameter: 2 mm which is within normal limits. Liver: Increased echogenicity of hepatic parenchyma is noted suggesting hepatic steatosis with probable fatty sparing adjacent to gallbladder fossa. Portal vein is patent on color Doppler imaging with normal direction of blood flow towards the liver. Other: None. IMPRESSION: Increased echogenicity of hepatic parenchyma is noted with probable fatty sparing adjacent to gallbladder fossa. Electronically Signed   By: Marijo Conception M.D.   On: 12/05/2019 13:49    Orson Eva, DO  [facility]  If 7PM-7AM, please contact night-coverage [email or web address] Password [email or web address] 12/06/2019, 10:03 AM   LOS: 1 day

## 2019-12-06 NOTE — Plan of Care (Signed)

## 2019-12-06 NOTE — Progress Notes (Signed)
   12/06/19 1815  Assess: MEWS Score  Temp 98.2 F (36.8 C)  BP 99/62  Pulse Rate 89  Resp 19  Level of Consciousness Alert  SpO2 97 %  O2 Device Nasal Cannula  O2 Flow Rate (L/min) 2 L/min  Assess: MEWS Score  MEWS Temp 0  MEWS Systolic 1  MEWS Pulse 0  MEWS RR 0  MEWS LOC 0  MEWS Score 1  MEWS Score Color Green  Assess: if the MEWS score is Yellow or Red  Were vital signs taken at a resting state? Yes  Focused Assessment No change from prior assessment  Early Detection of Sepsis Score *See Row Information* Medium  MEWS guidelines implemented *See Row Information* Yes  Treat  Pain Scale 0-10  Pain Score 0  Document  Patient Outcome Stabilized after interventions

## 2019-12-07 DIAGNOSIS — I10 Essential (primary) hypertension: Secondary | ICD-10-CM

## 2019-12-07 DIAGNOSIS — K810 Acute cholecystitis: Secondary | ICD-10-CM

## 2019-12-07 LAB — GLUCOSE, CAPILLARY
Glucose-Capillary: 238 mg/dL — ABNORMAL HIGH (ref 70–99)
Glucose-Capillary: 226 mg/dL — ABNORMAL HIGH (ref 70–99)
Glucose-Capillary: 199 mg/dL — ABNORMAL HIGH (ref 70–99)
Glucose-Capillary: 240 mg/dL — ABNORMAL HIGH (ref 70–99)

## 2019-12-07 LAB — COMPREHENSIVE METABOLIC PANEL
ALT: 29 U/L (ref 0–44)
AST: 29 U/L (ref 15–41)
Albumin: 2.5 g/dL — ABNORMAL LOW (ref 3.5–5.0)
Alkaline Phosphatase: 54 U/L (ref 38–126)
Anion gap: 6 (ref 5–15)
BUN: 21 mg/dL (ref 8–23)
CO2: 21 mmol/L — ABNORMAL LOW (ref 22–32)
Calcium: 7.7 mg/dL — ABNORMAL LOW (ref 8.9–10.3)
Chloride: 107 mmol/L (ref 98–111)
Creatinine, Ser: 0.68 mg/dL (ref 0.61–1.24)
GFR, Estimated: >60 mL/min (ref >60)
Glucose, Bld: 280 mg/dL — ABNORMAL HIGH (ref 70–99)
Potassium: 3.7 mmol/L (ref 3.5–5.1)
Sodium: 134 mmol/L — ABNORMAL LOW (ref 135–145)
Total Bilirubin: 0.7 mg/dL (ref 0.3–1.2)
Total Protein: 5.6 g/dL — ABNORMAL LOW (ref 6.5–8.1)

## 2019-12-07 LAB — CBC
HCT: 27 % — ABNORMAL LOW (ref 39.0–52.0)
Hemoglobin: 9 g/dL — ABNORMAL LOW (ref 13.0–17.0)
MCH: 33 pg (ref 26.0–34.0)
MCHC: 33.3 g/dL (ref 30.0–36.0)
MCV: 98.9 fL (ref 80.0–100.0)
Platelets: 91 10*3/uL — ABNORMAL LOW (ref 150–400)
RBC: 2.73 MIL/uL — ABNORMAL LOW (ref 4.22–5.81)
RDW: 20 % — ABNORMAL HIGH (ref 11.5–15.5)
WBC: 10.8 10*3/uL — ABNORMAL HIGH (ref 4.0–10.5)
nRBC: 0 % (ref 0.0–0.2)

## 2019-12-07 LAB — LIPASE, BLOOD: Lipase: 17 U/L (ref 11–51)

## 2019-12-07 MED ORDER — IPRATROPIUM-ALBUTEROL 0.5-2.5 (3) MG/3ML IN SOLN: 3.0000 mL | Freq: Four times a day (QID) | RESPIRATORY_TRACT | Status: DC | PRN

## 2019-12-07 MED ORDER — SODIUM CHLORIDE 0.9 % IV SOLN
2.0000 g | INTRAVENOUS | Status: DC
  Administered 2019-12-07 – 2019-12-09 (×3): 2 g via INTRAVENOUS
  Filled 2019-12-07 (×3): qty 20

## 2019-12-07 MED ORDER — LOPERAMIDE HCL 2 MG PO CAPS
2.0000 mg | ORAL_CAPSULE | Freq: Four times a day (QID) | ORAL | Status: DC | PRN
  Administered 2019-12-07: 2 mg via ORAL
  Filled 2019-12-07: qty 1

## 2019-12-07 MED ORDER — IPRATROPIUM-ALBUTEROL 0.5-2.5 (3) MG/3ML IN SOLN
3.0000 mL | Freq: Four times a day (QID) | RESPIRATORY_TRACT | Status: DC
  Administered 2019-12-07: 3 mL via RESPIRATORY_TRACT
  Filled 2019-12-07: qty 3

## 2019-12-07 MED ORDER — METRONIDAZOLE IN NACL 5-0.79 MG/ML-% IV SOLN
500.0000 mg | Freq: Three times a day (TID) | INTRAVENOUS | Status: DC
  Administered 2019-12-07 – 2019-12-09 (×6): 500 mg via INTRAVENOUS
  Filled 2019-12-07 (×6): qty 100

## 2019-12-07 NOTE — Progress Notes (Addendum)
Inpatient Diabetes Program Recommendations  AACE/ADA: New Consensus Statement on Inpatient Glycemic Control   Target Ranges:  Prepandial:   less than 140 mg/dL      Peak postprandial:   less than 180 mg/dL (1-2 hours)      Critically ill patients:  140 - 180 mg/dL   Results for RISHAV, ROCKEFELLER (MRN [id number]) as of 12/07/2019 10:31  Ref. Range 12/06/2019 08:20 12/06/2019 16:59 12/06/2019 21:27 12/07/2019 07:19  Glucose-Capillary Latest Ref Range: 70 - 99 mg/dL 174 (H) 203 (H) 275 (H) 238 (H)  Results for REEDER, BRISBY (MRN [id number]) as of 12/07/2019 14:54  Ref. Range 12/05/2019 12:31  Hemoglobin A1C Latest Ref Range: 4.8 - 5.6 % 9.9 (H)   Review of Glycemic Control  Diabetes history: DM2 Outpatient Diabetes medications: Glipizide XL 20 mg daily, Metformin 1000 mg BID Current orders for Inpatient glycemic control: Novolog 0-15 units TID with meals  Inpatient Diabetes Program Recommendations:    Insulin: Please consider ordering Novolog 0-5 units QHS for bedtime correction and Novolog 3 units TID with meals for meal coverage if patient eats at least 50% of meals.  NOTE: Spoke with patient about diabetes and home regimen for diabetes control. Patient reports being followed by PCP for diabetes management and currently taking Metformin 1000 mg BID and Glipizide 20 mg daily as an outpatient for diabetes control. Patient reports that he had stopped taking Metformin due to diarrhea and he just recently restarted taking it about 2 weeks ago after he last visit with PCP.   Patient reports that his PCP called him after the visit with lab results and said that his A1C was high and asked him to start taking Metformin again. PCP also asked about patient taking insulin and patient refuses to take insulin again (took it in the past when initially dx with DM several years ago).  Patient states that he rarely checks glucose but he has everything at home to monitor glucose. Patient states that the  last time he checked it at home it was in the 300's mg/dl.   Discussed A1C results (9.9% on 12/05/19) and explained that current A1C indicates an average glucose of 237 mg/dl over the past 2-3 months. Discussed glucose and A1C goals. Discussed importance of checking CBGs and maintaining good CBG control to prevent long-term and short-term complications. Explained how hyperglycemia leads to damage within blood vessels which lead to the common complications seen with uncontrolled diabetes. Stressed to the patient the importance of improving glycemic control to prevent further complications from uncontrolled diabetes. Discussed impact of nutrition, exercise, stress, sickness, and medications on diabetes control.  Discussed carbohydrates, carbohydrate goals per day and meal, along with portion sizes. Patient states he drinks diet sodas but he does not really follow any type of diet.  Encouraged patient follow carb modified diet, to check glucose daily and to follow up with PCP regarding DM control.  Encouraged patient to ask PCP about possibly using other classes of oral DM medications to improve DM control if A1C is still elevated when he follows up.  Patient verbalized understanding of information discussed and reports no further questions at this time related to diabetes.  Thanks, Barnie Alderman, RN, MSN, CDE Diabetes Coordinator Inpatient Diabetes Program [phone] (Team Pager from 8am to 5pm)

## 2019-12-07 NOTE — Plan of Care (Signed)
  Problem: Clinical Measurements: Goal: Respiratory complications will improve Outcome: Progressing   Problem: Activity: Goal: Risk for activity intolerance will decrease Outcome: Progressing   Problem: Nutrition: Goal: Adequate nutrition will be maintained Outcome: Progressing   

## 2019-12-07 NOTE — Progress Notes (Addendum)
Subjective: Patient much improved.  Tolerating soft diet well.  Denies any abdominal pain.  Objective: Vital signs in last 24 hours: Temp:  [96.8 F (36 C)-98.2 F (36.8 C)] 97.6 F (36.4 C) (10/13 1300) Pulse Rate:  [71-89] 80 (10/13 1300) Resp:  [17-21] 21 (10/13 1300) BP: (99-144)/(62-89) 144/89 (10/13 1300) SpO2:  [97 %-99 %] 98 % (10/13 1300) Last BM Date: 12/07/19  Intake/Output from previous day: 10/12 0701 - 10/13 0700 In: 1095.1 [I.V.:995.1; IV Piggyback:100] Out: 875 [Urine:875] Intake/Output this shift: No intake/output data recorded.  General appearance: alert, cooperative and no distress GI: soft, non-tender; bowel sounds normal; no masses,  no organomegaly  Lab Results:  Recent Labs    12/06/19 0447 12/07/19 0613  WBC 19.3* 10.8*  HGB 9.5* 9.0*  HCT 27.9* 27.0*  PLT 124* 91*   BMET Recent Labs    12/06/19 0447 12/07/19 0613  NA 134* 134*  K 4.1 3.7  CL 106 107  CO2 19* 21*  GLUCOSE 215* 280*  BUN 19 21  CREATININE 0.82 0.68  CALCIUM 7.5* 7.7*   PT/INR No results for input(s): LABPROT, INR in the last 72 hours.  Studies/Results: CT ANGIO CHEST PE W OR WO CONTRAST  Addendum Date: 12/06/2019   ADDENDUM REPORT: 12/06/2019 14:02 ADDENDUM: In addition of findings outlined previously there is a nodule in the RIGHT mid chest that may represent a perifissural lymph node though based on size and in the acute setting. (This measures 8 mm) Non-contrast chest CT at 6-12 months is recommended. If the nodule is stable at time of repeat CT, then future CT at 18-24 months (from today's scan) is considered optional for low-risk patients, but is recommended for high-risk patients. This recommendation follows the consensus statement: Guidelines for Management of Incidental Pulmonary Nodules Detected on CT Images: From the Fleischner Society 2017; Radiology 2017; 284:228-243. These results will be called to the ordering clinician or representative by the  Radiologist Assistant, and communication documented in the PACS or [employer]. Electronically Signed   By: Zetta Bills M.D.   On: 12/06/2019 14:02   Result Date: 12/06/2019 CLINICAL DATA:  Acute abdominal pain and shortness of breath. EXAM: CT ANGIOGRAPHY CHEST CT ABDOMEN AND PELVIS WITH CONTRAST TECHNIQUE: Multidetector CT imaging of the chest was performed using the standard protocol during bolus administration of intravenous contrast. Multiplanar CT image reconstructions and MIPs were obtained to evaluate the vascular anatomy. Multidetector CT imaging of the abdomen and pelvis was performed using the standard protocol during bolus administration of intravenous contrast. CONTRAST:  170mL OMNIPAQUE IOHEXOL 350 MG/ML SOLN COMPARISON:  Recent CT evaluation from 12/02/2019 FINDINGS: CTA CHEST FINDINGS Cardiovascular: Scattered atherosclerosis without signs of aneurysm of the thoracic aorta. Heart size mildly enlarged without pericardial effusion. Three-vessel coronary artery disease. 3.2 cm main pulmonary artery. No central or lobar level pulmonary embolism. Segmental branches with limited assessment in particular in the lung bases due to respiratory motion and bolus timing. Mediastinum/Nodes: Esophagus is normal. Thoracic inlet structures are unremarkable. No axillary lymphadenopathy no mediastinal or hilar lymphadenopathy Lungs/Pleura: Mild septal thickening, signs of some subpleural reticulation. Evidence of basilar atelectasis. No lobar consolidation. No pleural effusion. Airways are patent. Peri fissural nodule measures 8 x 7 mm (image 75 of series 4) Musculoskeletal: No chest wall mass. No acute musculoskeletal process. No destructive bone finding spinal degenerative changes. Review of the MIP images confirms the above findings. CT ABDOMEN and PELVIS FINDINGS Hepatobiliary: Wall enhancement and pericholecystic stranding. Segmental enhancement and or increased density  along the common bile duct. No  intrahepatic biliary duct dilation. Pancreas: Pancreas with peripheral atrophy.  No ductal dilation. Spleen: Spleen normal in size and contour. Adrenals/Urinary Tract: Adrenal glands are normal. Symmetric renal enhancement.  No hydronephrosis. Stomach/Bowel: Small hiatal hernia. No acute small bowel process. Signs of diverticular disease. Stranding about the hepatic flexure of the colon is likely secondary to acute cholecystitis Vascular/Lymphatic: Calcified and noncalcified atheromatous plaque in the thoracic aorta extends into the abdominal aorta. No sign of aneurysm. There is no gastrohepatic or hepatoduodenal ligament lymphadenopathy. No retroperitoneal or mesenteric lymphadenopathy. No pelvic sidewall lymphadenopathy. Reproductive: Heterogeneous prostate similar to the prior exam. Other: Small to moderate fat containing bilateral inguinal hernias and small fat containing umbilical hernia. Musculoskeletal: No acute musculoskeletal process. No destructive bone finding. Spinal degenerative changes. Review of the MIP images confirms the above findings. IMPRESSION: 1. No signs of central or lobar level pulmonary embolism. Segmental branches with limited assessment in particular in the lung bases due to respiratory motion and bolus timing. 2. Mild septal thickening, signs of some subpleural reticulation, may represent mild interstitial lung disease. 3. CT findings of acute cholecystitis with interval increase in pericholecystic stranding when compared to prior imaging. HIDA scan could be helpful for added specificity in the setting of acute cholecystitis if there is some doubt as to diagnosis based on clinical picture. 4. Segmental enhancement and or increased density along the common bile duct may represent choledocholithiasis or cholangitis as it is slightly longer segment than expected for biliary calculi. No intrahepatic biliary duct dilation. Follow-up CT with contrast or MRI/MRCP may be helpful to exclude  underlying lesion. 5. Signs of diverticular disease. 6. Small hiatal hernia. 7. Small to moderate fat containing bilateral inguinal hernias and small fat containing umbilical hernia. 8. Aortic atherosclerosis. Aortic Atherosclerosis (ICD10-I70.0). Electronically Signed: By: Zetta Bills M.D. On: 12/06/2019 13:39   CT ABDOMEN PELVIS W CONTRAST  Addendum Date: 12/06/2019   ADDENDUM REPORT: 12/06/2019 14:02 ADDENDUM: In addition of findings outlined previously there is a nodule in the RIGHT mid chest that may represent a perifissural lymph node though based on size and in the acute setting. (This measures 8 mm) Non-contrast chest CT at 6-12 months is recommended. If the nodule is stable at time of repeat CT, then future CT at 18-24 months (from today's scan) is considered optional for low-risk patients, but is recommended for high-risk patients. This recommendation follows the consensus statement: Guidelines for Management of Incidental Pulmonary Nodules Detected on CT Images: From the Fleischner Society 2017; Radiology 2017; 284:228-243. These results will be called to the ordering clinician or representative by the Radiologist Assistant, and communication documented in the PACS or [employer]. Electronically Signed   By: Zetta Bills M.D.   On: 12/06/2019 14:02   Result Date: 12/06/2019 CLINICAL DATA:  Acute abdominal pain and shortness of breath. EXAM: CT ANGIOGRAPHY CHEST CT ABDOMEN AND PELVIS WITH CONTRAST TECHNIQUE: Multidetector CT imaging of the chest was performed using the standard protocol during bolus administration of intravenous contrast. Multiplanar CT image reconstructions and MIPs were obtained to evaluate the vascular anatomy. Multidetector CT imaging of the abdomen and pelvis was performed using the standard protocol during bolus administration of intravenous contrast. CONTRAST:  158mL OMNIPAQUE IOHEXOL 350 MG/ML SOLN COMPARISON:  Recent CT evaluation from 12/02/2019 FINDINGS: CTA  CHEST FINDINGS Cardiovascular: Scattered atherosclerosis without signs of aneurysm of the thoracic aorta. Heart size mildly enlarged without pericardial effusion. Three-vessel coronary artery disease. 3.2 cm main pulmonary artery. No  central or lobar level pulmonary embolism. Segmental branches with limited assessment in particular in the lung bases due to respiratory motion and bolus timing. Mediastinum/Nodes: Esophagus is normal. Thoracic inlet structures are unremarkable. No axillary lymphadenopathy no mediastinal or hilar lymphadenopathy Lungs/Pleura: Mild septal thickening, signs of some subpleural reticulation. Evidence of basilar atelectasis. No lobar consolidation. No pleural effusion. Airways are patent. Peri fissural nodule measures 8 x 7 mm (image 75 of series 4) Musculoskeletal: No chest wall mass. No acute musculoskeletal process. No destructive bone finding spinal degenerative changes. Review of the MIP images confirms the above findings. CT ABDOMEN and PELVIS FINDINGS Hepatobiliary: Wall enhancement and pericholecystic stranding. Segmental enhancement and or increased density along the common bile duct. No intrahepatic biliary duct dilation. Pancreas: Pancreas with peripheral atrophy.  No ductal dilation. Spleen: Spleen normal in size and contour. Adrenals/Urinary Tract: Adrenal glands are normal. Symmetric renal enhancement.  No hydronephrosis. Stomach/Bowel: Small hiatal hernia. No acute small bowel process. Signs of diverticular disease. Stranding about the hepatic flexure of the colon is likely secondary to acute cholecystitis Vascular/Lymphatic: Calcified and noncalcified atheromatous plaque in the thoracic aorta extends into the abdominal aorta. No sign of aneurysm. There is no gastrohepatic or hepatoduodenal ligament lymphadenopathy. No retroperitoneal or mesenteric lymphadenopathy. No pelvic sidewall lymphadenopathy. Reproductive: Heterogeneous prostate similar to the prior exam. Other: Small  to moderate fat containing bilateral inguinal hernias and small fat containing umbilical hernia. Musculoskeletal: No acute musculoskeletal process. No destructive bone finding. Spinal degenerative changes. Review of the MIP images confirms the above findings. IMPRESSION: 1. No signs of central or lobar level pulmonary embolism. Segmental branches with limited assessment in particular in the lung bases due to respiratory motion and bolus timing. 2. Mild septal thickening, signs of some subpleural reticulation, may represent mild interstitial lung disease. 3. CT findings of acute cholecystitis with interval increase in pericholecystic stranding when compared to prior imaging. HIDA scan could be helpful for added specificity in the setting of acute cholecystitis if there is some doubt as to diagnosis based on clinical picture. 4. Segmental enhancement and or increased density along the common bile duct may represent choledocholithiasis or cholangitis as it is slightly longer segment than expected for biliary calculi. No intrahepatic biliary duct dilation. Follow-up CT with contrast or MRI/MRCP may be helpful to exclude underlying lesion. 5. Signs of diverticular disease. 6. Small hiatal hernia. 7. Small to moderate fat containing bilateral inguinal hernias and small fat containing umbilical hernia. 8. Aortic atherosclerosis. Aortic Atherosclerosis (ICD10-I70.0). Electronically Signed: By: Zetta Bills M.D. On: 12/06/2019 13:39   MR 3D Recon At Scanner  Result Date: 12/06/2019 CLINICAL DATA:  Right upper quadrant abdominal pain EXAM: MRI ABDOMEN WITHOUT AND WITH CONTRAST (INCLUDING MRCP) TECHNIQUE: Multiplanar multisequence MR imaging of the abdomen was performed both before and after the administration of intravenous contrast. Heavily T2-weighted images of the biliary and pancreatic ducts were obtained, and three-dimensional MRCP images were rendered by post processing. CONTRAST:  37mL GADAVIST GADOBUTROL 1  MMOL/ML IV SOLN COMPARISON:  CT abdomen 12/06/2019 FINDINGS: Despite efforts by the technologist and patient, motion artifact is present on today's exam and could not be eliminated. This reduces exam sensitivity and specificity. Lower chest: Mildly accentuated interstitium. Mild cardiomegaly. No significant pleural effusion. Hepatobiliary: Gallbladder wall thickening is present along with edema in the adipose tissues adjacent to the gallbladder favoring gallbladder inflammation. No gallstones are identified. No gas is identified in the gallbladder wall. On post-contrast images, there is only mildly accentuated enhancement in the gallbladder  wall. No gallbladder mass is evident. 0.6 cm cyst in segment 4 of the liver on image 13 of series 12. No biliary dilatation is observed. No significant abnormal hepatic enhancement. Pancreas:  Unremarkable Spleen:  Unremarkable Adrenals/Urinary Tract: 1.0 by 0.6 cm right adrenal myelolipoma. Mild scarring of the right mid kidney posteriorly. Stomach/Bowel: Unremarkable Vascular/Lymphatic: Aortoiliac atherosclerotic vascular disease. No pathologic adenopathy. Other:  No supplemental non-categorized findings. Musculoskeletal: Thoracic and lumbar spondylosis and degenerative disc disease. IMPRESSION: 1. Gallbladder wall thickening and edema in the adipose tissues adjacent to the gallbladder favoring gallbladder inflammation/acalculous cholecystitis. No gallstones are identified. 2. No biliary dilatation or choledocholithiasis is identified. 3. Small right adrenal myelolipoma. 4. Mild scarring of the right mid kidney posteriorly. 5. Mild cardiomegaly. 6. Thoracic and lumbar spondylosis and degenerative disc disease. Electronically Signed   By: Van Clines M.D.   On: 12/06/2019 16:23   MR ABDOMEN MRCP W WO CONTAST  Result Date: 12/06/2019 CLINICAL DATA:  Right upper quadrant abdominal pain EXAM: MRI ABDOMEN WITHOUT AND WITH CONTRAST (INCLUDING MRCP) TECHNIQUE: Multiplanar  multisequence MR imaging of the abdomen was performed both before and after the administration of intravenous contrast. Heavily T2-weighted images of the biliary and pancreatic ducts were obtained, and three-dimensional MRCP images were rendered by post processing. CONTRAST:  76mL GADAVIST GADOBUTROL 1 MMOL/ML IV SOLN COMPARISON:  CT abdomen 12/06/2019 FINDINGS: Despite efforts by the technologist and patient, motion artifact is present on today's exam and could not be eliminated. This reduces exam sensitivity and specificity. Lower chest: Mildly accentuated interstitium. Mild cardiomegaly. No significant pleural effusion. Hepatobiliary: Gallbladder wall thickening is present along with edema in the adipose tissues adjacent to the gallbladder favoring gallbladder inflammation. No gallstones are identified. No gas is identified in the gallbladder wall. On post-contrast images, there is only mildly accentuated enhancement in the gallbladder wall. No gallbladder mass is evident. 0.6 cm cyst in segment 4 of the liver on image 13 of series 12. No biliary dilatation is observed. No significant abnormal hepatic enhancement. Pancreas:  Unremarkable Spleen:  Unremarkable Adrenals/Urinary Tract: 1.0 by 0.6 cm right adrenal myelolipoma. Mild scarring of the right mid kidney posteriorly. Stomach/Bowel: Unremarkable Vascular/Lymphatic: Aortoiliac atherosclerotic vascular disease. No pathologic adenopathy. Other:  No supplemental non-categorized findings. Musculoskeletal: Thoracic and lumbar spondylosis and degenerative disc disease. IMPRESSION: 1. Gallbladder wall thickening and edema in the adipose tissues adjacent to the gallbladder favoring gallbladder inflammation/acalculous cholecystitis. No gallstones are identified. 2. No biliary dilatation or choledocholithiasis is identified. 3. Small right adrenal myelolipoma. 4. Mild scarring of the right mid kidney posteriorly. 5. Mild cardiomegaly. 6. Thoracic and lumbar spondylosis  and degenerative disc disease. Electronically Signed   By: Van Clines M.D.   On: 12/06/2019 16:23    Anti-infectives: Anti-infectives (From admission, onward)   Start     Dose/Rate Route Frequency Ordered Stop   12/07/19 1400  metroNIDAZOLE (FLAGYL) IVPB 500 mg        500 mg 100 mL/hr over 60 Minutes Intravenous Every 8 hours 12/07/19 1047     12/07/19 1100  cefTRIAXone (ROCEPHIN) 2 g in sodium chloride 0.9 % 100 mL IVPB        2 g 200 mL/hr over 30 Minutes Intravenous Every 24 hours 12/07/19 1011     12/06/19 2000  meropenem (MERREM) 1 g in sodium chloride 0.9 % 100 mL IVPB  Status:  Discontinued        1 g 200 mL/hr over 30 Minutes Intravenous Every 8 hours 12/06/19 1839 12/07/19 1011  12/06/19 1000  vancomycin (VANCOREADY) IVPB 1250 mg/250 mL  Status:  Discontinued        1,250 mg 166.7 mL/hr over 90 Minutes Intravenous Every 12 hours 12/06/19 0856 12/06/19 1839   12/06/19 0900  ceFEPIme (MAXIPIME) 2 g in sodium chloride 0.9 % 100 mL IVPB  Status:  Discontinued        2 g 200 mL/hr over 30 Minutes Intravenous Every 8 hours 12/06/19 0856 12/06/19 1839   12/06/19 0000  metroNIDAZOLE (FLAGYL) IVPB 500 mg  Status:  Discontinued        500 mg 100 mL/hr over 60 Minutes Intravenous Every 8 hours 12/05/19 1956 12/06/19 1839   12/05/19 1715  metroNIDAZOLE (FLAGYL) IVPB 500 mg  Status:  Discontinued        500 mg 100 mL/hr over 60 Minutes Intravenous Every 8 hours 12/05/19 1708 12/05/19 1956   12/05/19 1600  vancomycin (VANCOREADY) IVPB 2000 mg/400 mL        2,000 mg 200 mL/hr over 120 Minutes Intravenous  Once 12/05/19 1503 12/05/19 1929   12/05/19 1415  ceFEPIme (MAXIPIME) 2 g in sodium chloride 0.9 % 100 mL IVPB        2 g 200 mL/hr over 30 Minutes Intravenous  Once 12/05/19 1407 12/05/19 1543   12/05/19 1415  metroNIDAZOLE (FLAGYL) IVPB 500 mg        500 mg 100 mL/hr over 60 Minutes Intravenous  Once 12/05/19 1407 12/05/19 1700   12/05/19 1415  vancomycin (VANCOCIN) IVPB  1000 mg/200 mL premix  Status:  Discontinued        1,000 mg 200 mL/hr over 60 Minutes Intravenous  Once 12/05/19 1407 12/05/19 1502      Assessment/Plan: Impression: Abdominal pain and leukocytosis secondary to presumed acalculous cholecystitis resolved.  No need for surgical intervention at this time.  Okay for discharge once cleared by medicine.  No need for follow-up at this time.  Would continue full 10-day course of antibiotics.  Discussed with Dr. Roderic Palau.  LOS: 2 days    Aviva Signs 12/07/2019

## 2019-12-07 NOTE — Progress Notes (Signed)
PROGRESS NOTE  Andrew Humphrey IHW:[id number] DOB: 10-17-1940 DOA: 12/05/2019 PCP: Lanelle Bal, PA-C  Brief History:   79 y.o. male with medical history of atrial fibrillation, hypertension, diabetes mellitus type 2, GI bleed presenting with generalized weakness and fevers and chills.  The patient was initially seen in the emergency department on 12/02/2019 with left upper quadrant abdominal pain.  CT of the abdomen and pelvis on 1021 showed mild pericholecystic inflammatory changes toward the gallbladder neck and moderate gallbladder distention.  There was also some segmental thickening of the rectosigmoid colon with minimal pericolonic haze.  Patient was treated symptomatically and discharged home in stable condition.  On 12/03/2019 and 12/04/2019, the patient states that he was feeling well.  He had denied any fevers, chills, chest pain or shortness breath, coughing, hemoptysis, nausea, vomiting, diarrhea.  He states that his abdominal pain had actually improved.  The patient's significant other is also helping with the history.  Apparently, his significant other stated that he began having some mild abdominal cramping again on the evening of 12/04/2019 although the patient denies this.  The patient was eating fine over the weekend.  He denied headache or neck pain.  He does denies any new medications. On the morning of 12/05/2019, his significant other came over to check up on him and found him on the floor lethargic.  EMS was activated.  Apparently, the patient had bowel incontinence and emesis on him. In the emergency department, the patient is alert and oriented x4.  He actually denies any complaints presently including nausea, vomiting, diarrhea, domino pain, dysuria, hematuria.  He did remember being incontinent of stool, but stated that he was too weak to get up.  He denies any alcohol or illegal drug use.  He does drink alcohol rarely.  In the emergency department, the patient was  febrile to 101.4 F with hypotension with systolic blood pressure in the upper 80s.  BMP showed a sodium 133, potassium 3.2, serum creatinine 1.29.  LFTs were essentially unremarkable with lipase 19.  Total bilirubin was 1.8.  BBC 32.0, hemoglobin 9.5, platelets 168,000.  Right upper quadrant abdominal sono showed no gallstones or gallbladder wall thickening.  There was hepatic echogenicity.  There was fatty sparing adjacent to the gallbladder fossa.  Lactic acid was 5.4.The patient was given fluids and IV antibiotics.  Assessment/Plan: SIRS -Source unclear at this time although suspect acalculous cholecystitis, aspiration pneumonitis -Patient was treated empirically with IV antibiotics -Blood culture positive for Klebsiella -Overall, he is improving with IV antibiotics -Seen by general surgery, no indication for surgical management at this time -We will continue on oral antibiotics to complete 10-day course  Hyponatremia -Secondary to volume depletion -Continue IV fluids  Acute kidney injury -Secondary to sepsis and volume depletion -Baseline creatinine 0.6-0.8 -Presented with serum creatinine 1.29 -improving with IVF, creatinine back to baseline  Hypokalemia -Replete -Check magnesium  Uncontrolled diabetes mellitus type 2 with hyperglycemia -Hemoglobin A1c--9.9 -Holding glipizide and Metformin -NovoLog sliding scale   Generalized weakness -Physical therapy evaluation    Status is: Inpatient  Remains inpatient appropriate because:IV treatments appropriate due to intensity of illness or inability to take PO   Dispo: The patient is from: Home              Anticipated d/c is to: Home              Anticipated d/c date is: 1 days  Patient currently is not medically stable to d/c.        Family Communication:  Significant other at bedside updated 10/13   Consultants:  none  Code Status:  FULL   DVT Prophylaxis:   [place] Lovenox   Procedures: As  Listed in Progress Note Above  Antibiotics: vanco 10/11>> 10/12 Cefepime 10/11>> 10/12 Metronidazole 10/11>> Ceftriaxone 10/13 >    Subjective: Feeling better, wants to go home.  No nausea or vomiting.  His wife is concerned that he has not been out of bed.  Objective: Vitals:   12/06/19 2234 12/07/19 0200 12/07/19 0616 12/07/19 1300  BP: 108/71 109/85 103/62 (!) 144/89  Pulse:  73 71 80  Resp:  18 17 (!) 21  Temp: 97.7 F (36.5 C) (!) 96.8 F (36 C)  97.6 F (36.4 C)  TempSrc: Oral Axillary  Oral  SpO2:  98% 99% 98%  Weight:      Height:        Intake/Output Summary (Last 24 hours) at 12/07/2019 1949 Last data filed at 12/07/2019 0612 Gross per 24 hour  Intake 1095.08 ml  Output 300 ml  Net 795.08 ml   Weight change:  Exam:  General exam: Alert, awake, oriented x 3 Respiratory system: Coarse breath sounds at bases. Respiratory effort normal. Cardiovascular system:RRR. No murmurs, rubs, gallops. Gastrointestinal system: Abdomen is nondistended, soft and nontender. No organomegaly or masses felt. Normal bowel sounds heard. Central nervous system: Alert and oriented. No focal neurological deficits. Extremities: No C/C/E, +pedal pulses Skin: No rashes, lesions or ulcers  Psychiatry: Judgement and insight appear normal. Mood & affect appropriate.      Data Reviewed: I have personally reviewed following labs and imaging studies Basic Metabolic Panel: Recent Labs  Lab 12/02/19 1503 12/05/19 1202 12/06/19 0447 12/07/19 0613  NA 129* 133* 134* 134*  K 3.6 3.2* 4.1 3.7  CL 99 102 106 107  CO2 20* 16* 19* 21*  GLUCOSE 249* 205* 215* 280*  BUN 8 17 19 21   CREATININE 0.62 1.29* 0.82 0.68  CALCIUM 8.5* 8.3* 7.5* 7.7*   Liver Function Tests: Recent Labs  Lab 12/02/19 1503 12/05/19 1202 12/06/19 0447 12/07/19 0613  AST 29 34 47* 29  ALT 39 26 36 29  ALKPHOS 67 70 54 54  BILITOT 1.5* 1.8* 0.9 0.7  PROT 7.3 6.5 5.6* 5.6*  ALBUMIN 3.8 3.0* 2.5* 2.5*    Recent Labs  Lab 12/05/19 1202 12/07/19 0613  LIPASE 19 17   No results for input(s): AMMONIA in the last 168 hours. Coagulation Profile: No results for input(s): INR, PROTIME in the last 168 hours. CBC: Recent Labs  Lab 12/02/19 1500 12/05/19 1202 12/06/19 0447 12/07/19 0613  WBC 14.3* 32.0* 19.3* 10.8*  NEUTROABS  --  29.4*  --   --   HGB 13.0 11.5* 9.5* 9.0*  HCT 37.7* 33.5* 27.9* 27.0*  MCV 94.0 95.7 96.9 98.9  PLT 174 168 124* 91*   Cardiac Enzymes: No results for input(s): CKTOTAL, CKMB, CKMBINDEX, TROPONINI in the last 168 hours. BNP: Invalid input(s): POCBNP CBG: Recent Labs  Lab 12/06/19 1659 12/06/19 2127 12/07/19 0719 12/07/19 1209 12/07/19 1649  GLUCAP 203* 275* 238* 226* 199*   HbA1C: Recent Labs    12/05/19 1231  HGBA1C 9.9*   Urine analysis:    Component Value Date/Time   COLORURINE AMBER (A) 12/05/2019 1347   APPEARANCEUR CLOUDY (A) 12/05/2019 1347   LABSPEC 1.020 12/05/2019 1347   PHURINE 5.0 12/05/2019 1347   GLUCOSEU >=  500 (A) 12/05/2019 1347   HGBUR NEGATIVE 12/05/2019 1347   BILIRUBINUR SMALL (A) 12/05/2019 1347   KETONESUR NEGATIVE 12/05/2019 1347   PROTEINUR 30 (A) 12/05/2019 1347   NITRITE NEGATIVE 12/05/2019 1347   LEUKOCYTESUR NEGATIVE 12/05/2019 1347   Sepsis Labs: @LABRCNTIP (procalcitonin:4,lacticidven:4) ) Recent Results (from the past 240 hour(s))  Respiratory Panel by RT PCR (Flu A&B, Covid) - Nasopharyngeal Swab     Status: None   Collection Time: 12/05/19 12:27 PM   Specimen: Nasopharyngeal Swab  Result Value Ref Range Status   SARS Coronavirus 2 by RT PCR NEGATIVE NEGATIVE Final    Comment: (NOTE) SARS-CoV-2 target nucleic acids are NOT DETECTED.  The SARS-CoV-2 RNA is generally detectable in upper respiratoy specimens during the acute phase of infection. The lowest concentration of SARS-CoV-2 viral copies this assay can detect is 131 copies/mL. A negative result does not preclude SARS-Cov-2 infection and  should not be used as the sole basis for treatment or other patient management decisions. A negative result may occur with  improper specimen collection/handling, submission of specimen other than nasopharyngeal swab, presence of viral mutation(s) within the areas targeted by this assay, and inadequate number of viral copies (<131 copies/mL). A negative result must be combined with clinical observations, patient history, and epidemiological information. The expected result is Negative.  Fact Sheet for Patients:  PinkCheek.be  Fact Sheet for Healthcare Providers:  GravelBags.it  This test is no t yet approved or cleared by the [place] FDA and  has been authorized for detection and/or diagnosis of SARS-CoV-2 by FDA under an Emergency Use Authorization (EUA). This EUA will remain  in effect (meaning this test can be used) for the duration of the COVID-19 declaration under Section 564(b)(1) of the Act, 21 U.S.C. section 360bbb-3(b)(1), unless the authorization is terminated or revoked sooner.     Influenza A by PCR NEGATIVE NEGATIVE Final   Influenza B by PCR NEGATIVE NEGATIVE Final    Comment: (NOTE) The Xpert Xpress SARS-CoV-2/FLU/RSV assay is intended as an aid in  the diagnosis of influenza from Nasopharyngeal swab specimens and  should not be used as a sole basis for treatment. Nasal washings and  aspirates are unacceptable for Xpert Xpress SARS-CoV-2/FLU/RSV  testing.  Fact Sheet for Patients: PinkCheek.be  Fact Sheet for Healthcare Providers: GravelBags.it  This test is not yet approved or cleared by the [place] FDA and  has been authorized for detection and/or diagnosis of SARS-CoV-2 by  FDA under an Emergency Use Authorization (EUA). This EUA will remain  in effect (meaning this test can be used) for the duration of the  Covid-19 declaration  under Section 564(b)(1) of the Act, 21  U.S.C. section 360bbb-3(b)(1), unless the authorization is  terminated or revoked. Performed at [facility], [address], [place], [place] [address]   Blood culture (routine x 2)     Status: None (Preliminary result)   Collection Time: 12/05/19 12:50 PM   Specimen: BLOOD  Result Value Ref Range Status   Specimen Description BLOOD LEFT ANTECUBITAL DRAWN BY RN  Final   Special Requests   Final    BOTTLES DRAWN AEROBIC AND ANAEROBIC Blood Culture adequate volume   Culture   Final    NO GROWTH 2 DAYS Performed at [facility], [address], [place], [place] [address]    Report Status PENDING  Incomplete  Blood culture (routine x 2)     Status: Abnormal (Preliminary result)   Collection Time: 12/05/19 12:53 PM   Specimen:  BLOOD RIGHT FOREARM  Result Value Ref Range Status   Specimen Description   Final    BLOOD RIGHT FOREARM DRAWN BY RN Performed at [facility], [address] [place], [place] [address]    Special Requests   Final    BOTTLES DRAWN AEROBIC AND ANAEROBIC Blood Culture results may not be optimal due to an excessive volume of blood received in culture bottles Performed at [facility], [address] [place], [place] [address]    Culture  Setup Time   Final    GRAM NEGATIVE RODS WBC SEEN Gram Stain Report Called to,Read Back By and Verified With: SHANNON BROWN @1242  ON 10/12 BY [facility]J AEROBIC BOTTLE ONLY    Culture (A)  Final    KLEBSIELLA OXYTOCA SUSCEPTIBILITIES TO FOLLOW Performed at [facility], [place] [address], [place], [place] [address]    Report Status PENDING  Incomplete  Blood Culture ID Panel (Reflexed)     Status: Abnormal   Collection Time: 12/05/19 12:53 PM  Result Value Ref Range Status   Enterococcus faecalis NOT DETECTED NOT DETECTED Final   Enterococcus Faecium NOT DETECTED NOT DETECTED Final   Listeria monocytogenes NOT DETECTED NOT DETECTED Final   Staphylococcus species NOT DETECTED NOT DETECTED  Final   Staphylococcus aureus (BCID) NOT DETECTED NOT DETECTED Final   Staphylococcus epidermidis NOT DETECTED NOT DETECTED Final   Staphylococcus lugdunensis NOT DETECTED NOT DETECTED Final   Streptococcus species NOT DETECTED NOT DETECTED Final   Streptococcus agalactiae NOT DETECTED NOT DETECTED Final   Streptococcus pneumoniae NOT DETECTED NOT DETECTED Final   Streptococcus pyogenes NOT DETECTED NOT DETECTED Final   A.calcoaceticus-baumannii NOT DETECTED NOT DETECTED Final   Bacteroides fragilis NOT DETECTED NOT DETECTED Final   Enterobacterales DETECTED (A) NOT DETECTED Final    Comment: Enterobacterales represent a large order of gram negative bacteria, not a single organism. CRITICAL RESULT CALLED TO, READ BACK BY AND VERIFIED WITH: T VOGLER RN 12/06/19 AT 4650 SK    Enterobacter cloacae complex NOT DETECTED NOT DETECTED Final   Escherichia coli NOT DETECTED NOT DETECTED Final   Klebsiella aerogenes NOT DETECTED NOT DETECTED Final   Klebsiella oxytoca DETECTED (A) NOT DETECTED Final    Comment: CRITICAL RESULT CALLED TO, READ BACK BY AND VERIFIED WITH: T VOGLER RN 12/06/19 AT 1810 SK    Klebsiella pneumoniae NOT DETECTED NOT DETECTED Final   Proteus species NOT DETECTED NOT DETECTED Final   Salmonella species NOT DETECTED NOT DETECTED Final   Serratia marcescens NOT DETECTED NOT DETECTED Final   Haemophilus influenzae NOT DETECTED NOT DETECTED Final   Neisseria meningitidis NOT DETECTED NOT DETECTED Final   Pseudomonas aeruginosa NOT DETECTED NOT DETECTED Final   Stenotrophomonas maltophilia NOT DETECTED NOT DETECTED Final   Candida albicans NOT DETECTED NOT DETECTED Final   Candida auris NOT DETECTED NOT DETECTED Final   Candida glabrata NOT DETECTED NOT DETECTED Final   Candida krusei NOT DETECTED NOT DETECTED Final   Candida parapsilosis NOT DETECTED NOT DETECTED Final   Candida tropicalis NOT DETECTED NOT DETECTED Final   Cryptococcus neoformans/gattii NOT DETECTED NOT  DETECTED Final   CTX-M ESBL NOT DETECTED NOT DETECTED Final   Carbapenem resistance IMP NOT DETECTED NOT DETECTED Final   Carbapenem resistance KPC NOT DETECTED NOT DETECTED Final   Carbapenem resistance NDM NOT DETECTED NOT DETECTED Final   Carbapenem resist OXA 48 LIKE NOT DETECTED NOT DETECTED Final   Carbapenem resistance VIM NOT DETECTED NOT DETECTED Final  Comment: Performed at [facility], [place] [address], [place], [place] [address]  Urine culture     Status: Abnormal   Collection Time: 12/05/19  1:48 PM   Specimen: Urine, Random  Result Value Ref Range Status   Specimen Description   Final    URINE, RANDOM Performed at [facility], [address] [place], [place] [address]    Special Requests   Final    NONE Performed at [facility], [address] [place], [place] [address]    Culture (A)  Final    <10,000 COLONIES/mL INSIGNIFICANT GROWTH Performed at [facility], [place] [address], [place], [place] [address]    Report Status 12/06/2019 FINAL  Final  MRSA PCR Screening     Status: None   Collection Time: 12/05/19  3:16 PM   Specimen: Nasal Mucosa; Nasopharyngeal  Result Value Ref Range Status   MRSA by PCR NEGATIVE NEGATIVE Final    Comment:        The GeneXpert MRSA Assay (FDA approved for NASAL specimens only), is one component of a comprehensive MRSA colonization surveillance program. It is not intended to diagnose MRSA infection nor to guide or monitor treatment for MRSA infections. Performed at [facility], [address], [place], [place] [address]      Scheduled Meds: . aspirin EC  81 mg Oral q morning - 10a  . insulin aspart  0-15 Units Subcutaneous TID WC  . ipratropium-albuterol  3 mL Nebulization Q6H   Continuous Infusions: . cefTRIAXone (ROCEPHIN)  IV 2 g (12/07/19 1302)  . metronidazole 500 mg (12/07/19 1515)    Procedures/Studies: DG Chest 2 View  Result Date: 12/05/2019 CLINICAL DATA:  Fever, weakness. EXAM: CHEST - 2  VIEW COMPARISON:  December 02, 2019. FINDINGS: Stable cardiomegaly. No pneumothorax or pleural effusion is noted. Both lungs are clear. The visualized skeletal structures are unremarkable. IMPRESSION: No active cardiopulmonary disease. Electronically Signed   By: Marijo Conception M.D.   On: 12/05/2019 13:24   DG Chest 2 View  Result Date: 12/02/2019 CLINICAL DATA:  79 year old male with chest pain. EXAM: CHEST - 2 VIEW COMPARISON:  Chest radiograph dated 06/19/2017. FINDINGS: Faint bilateral peripheral and subpleural interstitial densities may be chronic or represent atypical infection. Clinical correlation is recommended. No lobar consolidation, pleural effusion, pneumothorax. Stable cardiac silhouette. No acute osseous pathology. IMPRESSION: Chronic changes versus atypical infection. No lobar consolidation. Electronically Signed   By: Anner Crete M.D.   On: 12/02/2019 15:37   CT ANGIO CHEST PE W OR WO CONTRAST  Addendum Date: 12/06/2019   ADDENDUM REPORT: 12/06/2019 14:02 ADDENDUM: In addition of findings outlined previously there is a nodule in the RIGHT mid chest that may represent a perifissural lymph node though based on size and in the acute setting. (This measures 8 mm) Non-contrast chest CT at 6-12 months is recommended. If the nodule is stable at time of repeat CT, then future CT at 18-24 months (from today's scan) is considered optional for low-risk patients, but is recommended for high-risk patients. This recommendation follows the consensus statement: Guidelines for Management of Incidental Pulmonary Nodules Detected on CT Images: From the Fleischner Society 2017; Radiology 2017; 284:228-243. These results will be called to the ordering clinician or representative by the Radiologist Assistant, and communication documented in the PACS or [employer]. Electronically Signed   By: Zetta Bills M.D.   On: 12/06/2019 14:02   Result Date: 12/06/2019 CLINICAL DATA:  Acute abdominal pain and  shortness of breath. EXAM: CT ANGIOGRAPHY CHEST CT ABDOMEN AND PELVIS WITH CONTRAST TECHNIQUE: Multidetector CT imaging of the chest was performed using the standard protocol during bolus administration of intravenous contrast. Multiplanar CT image reconstructions and MIPs were obtained to evaluate the vascular anatomy. Multidetector CT imaging of the abdomen and pelvis was performed using the standard protocol during bolus administration of intravenous contrast. CONTRAST:  171mL OMNIPAQUE IOHEXOL 350 MG/ML SOLN COMPARISON:  Recent CT evaluation from 12/02/2019 FINDINGS: CTA CHEST FINDINGS Cardiovascular: Scattered atherosclerosis without signs of aneurysm of the thoracic aorta. Heart size mildly enlarged without pericardial effusion. Three-vessel coronary artery disease. 3.2 cm main pulmonary artery. No central or lobar level pulmonary embolism. Segmental branches with limited assessment in particular in the lung bases due to respiratory motion and bolus timing. Mediastinum/Nodes: Esophagus is normal. Thoracic inlet structures are unremarkable. No axillary lymphadenopathy no mediastinal or hilar lymphadenopathy Lungs/Pleura: Mild septal thickening, signs of some subpleural reticulation. Evidence of basilar atelectasis. No lobar consolidation. No pleural effusion. Airways are patent. Peri fissural nodule measures 8 x 7 mm (image 75 of series 4) Musculoskeletal: No chest wall mass. No acute musculoskeletal process. No destructive bone finding spinal degenerative changes. Review of the MIP images confirms the above findings. CT ABDOMEN and PELVIS FINDINGS Hepatobiliary: Wall enhancement and pericholecystic stranding. Segmental enhancement and or increased density along the common bile duct. No intrahepatic biliary duct dilation. Pancreas: Pancreas with peripheral atrophy.  No ductal dilation. Spleen: Spleen normal in size and contour. Adrenals/Urinary Tract: Adrenal glands are normal. Symmetric renal enhancement.  No  hydronephrosis. Stomach/Bowel: Small hiatal hernia. No acute small bowel process. Signs of diverticular disease. Stranding about the hepatic flexure of the colon is likely secondary to acute cholecystitis Vascular/Lymphatic: Calcified and noncalcified atheromatous plaque in the thoracic aorta extends into the abdominal aorta. No sign of aneurysm. There is no gastrohepatic or hepatoduodenal ligament lymphadenopathy. No retroperitoneal or mesenteric lymphadenopathy. No pelvic sidewall lymphadenopathy. Reproductive: Heterogeneous prostate similar to the prior exam. Other: Small to moderate fat containing bilateral inguinal hernias and small fat containing umbilical hernia. Musculoskeletal: No acute musculoskeletal process. No destructive bone finding. Spinal degenerative changes. Review of the MIP images confirms the above findings. IMPRESSION: 1. No signs of central or lobar level pulmonary embolism. Segmental branches with limited assessment in particular in the lung bases due to respiratory motion and bolus timing. 2. Mild septal thickening, signs of some subpleural reticulation, may represent mild interstitial lung disease. 3. CT findings of acute cholecystitis with interval increase in pericholecystic stranding when compared to prior imaging. HIDA scan could be helpful for added specificity in the setting of acute cholecystitis if there is some doubt as to diagnosis based on clinical picture. 4. Segmental enhancement and or increased density along the common bile duct may represent choledocholithiasis or cholangitis as it is slightly longer segment than expected for biliary calculi. No intrahepatic biliary duct dilation. Follow-up CT with contrast or MRI/MRCP may be helpful to exclude underlying lesion. 5. Signs of diverticular disease. 6. Small hiatal hernia. 7. Small to moderate fat containing bilateral inguinal hernias and small fat containing umbilical hernia. 8. Aortic atherosclerosis. Aortic Atherosclerosis  (ICD10-I70.0). Electronically Signed: By: Zetta Bills M.D. On: 12/06/2019 13:39   CT ABDOMEN PELVIS W CONTRAST  Addendum Date: 12/06/2019   ADDENDUM REPORT: 12/06/2019 14:02 ADDENDUM: In addition of findings outlined previously there is a nodule in the RIGHT mid chest that may represent a perifissural lymph node though based on size and in the acute setting. (This  measures 8 mm) Non-contrast chest CT at 6-12 months is recommended. If the nodule is stable at time of repeat CT, then future CT at 18-24 months (from today's scan) is considered optional for low-risk patients, but is recommended for high-risk patients. This recommendation follows the consensus statement: Guidelines for Management of Incidental Pulmonary Nodules Detected on CT Images: From the Fleischner Society 2017; Radiology 2017; 284:228-243. These results will be called to the ordering clinician or representative by the Radiologist Assistant, and communication documented in the PACS or [employer]. Electronically Signed   By: Zetta Bills M.D.   On: 12/06/2019 14:02   Result Date: 12/06/2019 CLINICAL DATA:  Acute abdominal pain and shortness of breath. EXAM: CT ANGIOGRAPHY CHEST CT ABDOMEN AND PELVIS WITH CONTRAST TECHNIQUE: Multidetector CT imaging of the chest was performed using the standard protocol during bolus administration of intravenous contrast. Multiplanar CT image reconstructions and MIPs were obtained to evaluate the vascular anatomy. Multidetector CT imaging of the abdomen and pelvis was performed using the standard protocol during bolus administration of intravenous contrast. CONTRAST:  147mL OMNIPAQUE IOHEXOL 350 MG/ML SOLN COMPARISON:  Recent CT evaluation from 12/02/2019 FINDINGS: CTA CHEST FINDINGS Cardiovascular: Scattered atherosclerosis without signs of aneurysm of the thoracic aorta. Heart size mildly enlarged without pericardial effusion. Three-vessel coronary artery disease. 3.2 cm main pulmonary artery. No  central or lobar level pulmonary embolism. Segmental branches with limited assessment in particular in the lung bases due to respiratory motion and bolus timing. Mediastinum/Nodes: Esophagus is normal. Thoracic inlet structures are unremarkable. No axillary lymphadenopathy no mediastinal or hilar lymphadenopathy Lungs/Pleura: Mild septal thickening, signs of some subpleural reticulation. Evidence of basilar atelectasis. No lobar consolidation. No pleural effusion. Airways are patent. Peri fissural nodule measures 8 x 7 mm (image 75 of series 4) Musculoskeletal: No chest wall mass. No acute musculoskeletal process. No destructive bone finding spinal degenerative changes. Review of the MIP images confirms the above findings. CT ABDOMEN and PELVIS FINDINGS Hepatobiliary: Wall enhancement and pericholecystic stranding. Segmental enhancement and or increased density along the common bile duct. No intrahepatic biliary duct dilation. Pancreas: Pancreas with peripheral atrophy.  No ductal dilation. Spleen: Spleen normal in size and contour. Adrenals/Urinary Tract: Adrenal glands are normal. Symmetric renal enhancement.  No hydronephrosis. Stomach/Bowel: Small hiatal hernia. No acute small bowel process. Signs of diverticular disease. Stranding about the hepatic flexure of the colon is likely secondary to acute cholecystitis Vascular/Lymphatic: Calcified and noncalcified atheromatous plaque in the thoracic aorta extends into the abdominal aorta. No sign of aneurysm. There is no gastrohepatic or hepatoduodenal ligament lymphadenopathy. No retroperitoneal or mesenteric lymphadenopathy. No pelvic sidewall lymphadenopathy. Reproductive: Heterogeneous prostate similar to the prior exam. Other: Small to moderate fat containing bilateral inguinal hernias and small fat containing umbilical hernia. Musculoskeletal: No acute musculoskeletal process. No destructive bone finding. Spinal degenerative changes. Review of the MIP images  confirms the above findings. IMPRESSION: 1. No signs of central or lobar level pulmonary embolism. Segmental branches with limited assessment in particular in the lung bases due to respiratory motion and bolus timing. 2. Mild septal thickening, signs of some subpleural reticulation, may represent mild interstitial lung disease. 3. CT findings of acute cholecystitis with interval increase in pericholecystic stranding when compared to prior imaging. HIDA scan could be helpful for added specificity in the setting of acute cholecystitis if there is some doubt as to diagnosis based on clinical picture. 4. Segmental enhancement and or increased density along the common bile duct may represent choledocholithiasis or cholangitis as it  is slightly longer segment than expected for biliary calculi. No intrahepatic biliary duct dilation. Follow-up CT with contrast or MRI/MRCP may be helpful to exclude underlying lesion. 5. Signs of diverticular disease. 6. Small hiatal hernia. 7. Small to moderate fat containing bilateral inguinal hernias and small fat containing umbilical hernia. 8. Aortic atherosclerosis. Aortic Atherosclerosis (ICD10-I70.0). Electronically Signed: By: Zetta Bills M.D. On: 12/06/2019 13:39   CT Abdomen Pelvis W Contrast  Result Date: 12/02/2019 CLINICAL DATA:  Epigastric and left lower quadrant pain with chest pain onset last night EXAM: CT ABDOMEN AND PELVIS WITH CONTRAST TECHNIQUE: Multidetector CT imaging of the abdomen and pelvis was performed using the standard protocol following bolus administration of intravenous contrast. CONTRAST:  148mL OMNIPAQUE IOHEXOL 300 MG/ML  SOLN COMPARISON:  CT abdomen pelvis 10/09/2009 (report only, CT abdomen pelvis 01/24/2004 FINDINGS: Lower chest: Atelectatic changes are present in the lung bases. Additional subpleural reticular features are noted, incompletely assessed on this exam. No consolidation or pleural effusion. Normal heart size. No pericardial effusion.  Three-vessel coronary artery calcifications. Calcifications of the aortic leaflets and mitral annulus as well. Hepatobiliary: Diffuse hepatic hypoattenuation compatible with hepatic steatosis. Sparing along the gallbladder fossa. No focal worrisome liver lesion. Mild pericholecystic inflammatory change particularly towards the neck of the gallbladder with moderate gallbladder distension. No visible calcified gallstone is seen. No biliary ductal dilatation or visible intraductal gallstones. Pancreas: Unremarkable. No pancreatic ductal dilatation or surrounding inflammatory changes. Spleen: Normal in size. No concerning splenic lesions. Adrenals/Urinary Tract: Normal adrenal glands. Kidneys are normally located with symmetric enhancement and excretion. Few scattered subcentimeter hypoattenuating foci in both kidneys too small to fully characterize on CT imaging but statistically likely benign. Nonobstructing calculus seen in the lower pole left kidney measuring up to 3 mm in size. No suspicious renal lesion, obstructive urolithiasis or hydronephrosis. Urinary bladder is largely decompressed at the time of exam and therefore poorly evaluated by CT imaging. No acute bladder abnormality. Mild indentation of the bladder base by the prostate. Stomach/Bowel: Small sliding-type hiatal hernia. Distal stomach and duodenum are unremarkable. No small bowel thickening or dilatation. Thickening towards the hepatic flexure is favored to be reactive to the process in the gallbladder fossa. Distal colonic diverticulosis is noted. Some mild segmental thickening of the rectosigmoid is noted albeit not focally centered upon any particular culprit diverticulum. Minimal pericolonic haze. Could reflect a mild colitis versus sequela of prior inflammation. No evidence of obstruction. Appendix is not visualized. No focal inflammation the vicinity of the cecum to suggest an occult appendicitis. Vascular/Lymphatic: Atherosclerotic  calcifications within the abdominal aorta and branch vessels. No aneurysm or ectasia. No enlarged abdominopelvic lymph nodes. Reproductive: Few calcifications within a borderline enlarged prostate. Seminal vesicles are unremarkable. Other: Inflammatory changes predominantly along the gallbladder fossa and inferior tip of the liver as well as minimally adjacent the rectosigmoid though this may be redistributed inflammatory change. No free air or fluid. No organized collection or abscess. Multilevel degenerative changes are present in the imaged portions of the spine. No acute osseous abnormality or suspicious osseous lesion. Musculoskeletal: Multilevel degenerative changes are present in the imaged portions of the spine. Minimal retrolisthesis L1 on L2 without pars defects. Possible subacute to chronic subcortical impaction fracture of anterosuperior endplate L3 with up to 32% height loss. No other acute or suspicious osseous abnormalities. Additional degenerative changes in the hips and pelvis. IMPRESSION: 1. Mild pericholecystic inflammatory change particularly towards the neck of the gallbladder with moderate gallbladder distension. No visible calcified gallstone is seen.  Findings are concerning for acute cholecystitis. Recommend further evaluation with right upper quadrant ultrasound. 2. Some mild segmental thickening of the rectosigmoid is noted albeit not focally centered upon any particular culprit diverticulum. Minimal pericolonic haze. Could reflect a mild colitis versus sequela of prior inflammation. Correlate with clinical symptoms and consider outpatient evaluation with colonoscopy if not recently performed. 3. Nonobstructing left nephrolithiasis. 4. Likely subacute to chronic subcortical impaction fracture of anterosuperior endplate L3 with up to 19% height loss. Correlate for point tenderness to exclude acuity. 5. Hepatic steatosis. 6. Aortic Atherosclerosis (ICD10-I70.0). Electronically Signed   By:  Lovena Le M.D.   On: 12/02/2019 18:17   MR 3D Recon At Scanner  Result Date: 12/06/2019 CLINICAL DATA:  Right upper quadrant abdominal pain EXAM: MRI ABDOMEN WITHOUT AND WITH CONTRAST (INCLUDING MRCP) TECHNIQUE: Multiplanar multisequence MR imaging of the abdomen was performed both before and after the administration of intravenous contrast. Heavily T2-weighted images of the biliary and pancreatic ducts were obtained, and three-dimensional MRCP images were rendered by post processing. CONTRAST:  21mL GADAVIST GADOBUTROL 1 MMOL/ML IV SOLN COMPARISON:  CT abdomen 12/06/2019 FINDINGS: Despite efforts by the technologist and patient, motion artifact is present on today's exam and could not be eliminated. This reduces exam sensitivity and specificity. Lower chest: Mildly accentuated interstitium. Mild cardiomegaly. No significant pleural effusion. Hepatobiliary: Gallbladder wall thickening is present along with edema in the adipose tissues adjacent to the gallbladder favoring gallbladder inflammation. No gallstones are identified. No gas is identified in the gallbladder wall. On post-contrast images, there is only mildly accentuated enhancement in the gallbladder wall. No gallbladder mass is evident. 0.6 cm cyst in segment 4 of the liver on image 13 of series 12. No biliary dilatation is observed. No significant abnormal hepatic enhancement. Pancreas:  Unremarkable Spleen:  Unremarkable Adrenals/Urinary Tract: 1.0 by 0.6 cm right adrenal myelolipoma. Mild scarring of the right mid kidney posteriorly. Stomach/Bowel: Unremarkable Vascular/Lymphatic: Aortoiliac atherosclerotic vascular disease. No pathologic adenopathy. Other:  No supplemental non-categorized findings. Musculoskeletal: Thoracic and lumbar spondylosis and degenerative disc disease. IMPRESSION: 1. Gallbladder wall thickening and edema in the adipose tissues adjacent to the gallbladder favoring gallbladder inflammation/acalculous cholecystitis. No  gallstones are identified. 2. No biliary dilatation or choledocholithiasis is identified. 3. Small right adrenal myelolipoma. 4. Mild scarring of the right mid kidney posteriorly. 5. Mild cardiomegaly. 6. Thoracic and lumbar spondylosis and degenerative disc disease. Electronically Signed   By: Van Clines M.D.   On: 12/06/2019 16:23   US Abdomen Limited  Result Date: 12/05/2019 CLINICAL DATA:  Right upper quadrant abdominal pain. EXAM: ULTRASOUND ABDOMEN LIMITED RIGHT UPPER QUADRANT COMPARISON:  December 02, 2019. FINDINGS: Gallbladder: No gallstones or wall thickening visualized. No sonographic Murphy sign noted by sonographer. Common bile duct: Diameter: 2 mm which is within normal limits. Liver: Increased echogenicity of hepatic parenchyma is noted suggesting hepatic steatosis with probable fatty sparing adjacent to gallbladder fossa. Portal vein is patent on color Doppler imaging with normal direction of blood flow towards the liver. Other: None. IMPRESSION: Increased echogenicity of hepatic parenchyma is noted with probable fatty sparing adjacent to gallbladder fossa. Electronically Signed   By: Marijo Conception M.D.   On: 12/05/2019 13:49   MR ABDOMEN MRCP W WO CONTAST  Result Date: 12/06/2019 CLINICAL DATA:  Right upper quadrant abdominal pain EXAM: MRI ABDOMEN WITHOUT AND WITH CONTRAST (INCLUDING MRCP) TECHNIQUE: Multiplanar multisequence MR imaging of the abdomen was performed both before and after the administration of intravenous contrast. Heavily T2-weighted images of  the biliary and pancreatic ducts were obtained, and three-dimensional MRCP images were rendered by post processing. CONTRAST:  75mL GADAVIST GADOBUTROL 1 MMOL/ML IV SOLN COMPARISON:  CT abdomen 12/06/2019 FINDINGS: Despite efforts by the technologist and patient, motion artifact is present on today's exam and could not be eliminated. This reduces exam sensitivity and specificity. Lower chest: Mildly accentuated interstitium.  Mild cardiomegaly. No significant pleural effusion. Hepatobiliary: Gallbladder wall thickening is present along with edema in the adipose tissues adjacent to the gallbladder favoring gallbladder inflammation. No gallstones are identified. No gas is identified in the gallbladder wall. On post-contrast images, there is only mildly accentuated enhancement in the gallbladder wall. No gallbladder mass is evident. 0.6 cm cyst in segment 4 of the liver on image 13 of series 12. No biliary dilatation is observed. No significant abnormal hepatic enhancement. Pancreas:  Unremarkable Spleen:  Unremarkable Adrenals/Urinary Tract: 1.0 by 0.6 cm right adrenal myelolipoma. Mild scarring of the right mid kidney posteriorly. Stomach/Bowel: Unremarkable Vascular/Lymphatic: Aortoiliac atherosclerotic vascular disease. No pathologic adenopathy. Other:  No supplemental non-categorized findings. Musculoskeletal: Thoracic and lumbar spondylosis and degenerative disc disease. IMPRESSION: 1. Gallbladder wall thickening and edema in the adipose tissues adjacent to the gallbladder favoring gallbladder inflammation/acalculous cholecystitis. No gallstones are identified. 2. No biliary dilatation or choledocholithiasis is identified. 3. Small right adrenal myelolipoma. 4. Mild scarring of the right mid kidney posteriorly. 5. Mild cardiomegaly. 6. Thoracic and lumbar spondylosis and degenerative disc disease. Electronically Signed   By: Van Clines M.D.   On: 12/06/2019 16:23    Kathie Dike, MD  [facility]  If 7PM-7AM, please contact night-coverage [email or web address]  12/07/2019, 7:49 PM   LOS: 2 days

## 2019-12-08 ENCOUNTER — Inpatient Hospital Stay (HOSPITAL_COMMUNITY): Payer: Medicare HMO

## 2019-12-08 ENCOUNTER — Encounter (HOSPITAL_COMMUNITY): Payer: Self-pay | Admitting: Internal Medicine

## 2019-12-08 DIAGNOSIS — I4891 Unspecified atrial fibrillation: Secondary | ICD-10-CM

## 2019-12-08 LAB — COMPREHENSIVE METABOLIC PANEL
ALT: 30 U/L (ref 0–44)
AST: 32 U/L (ref 15–41)
Albumin: 2.7 g/dL — ABNORMAL LOW (ref 3.5–5.0)
Alkaline Phosphatase: 103 U/L (ref 38–126)
Anion gap: 10 (ref 5–15)
BUN: 20 mg/dL (ref 8–23)
CO2: 16 mmol/L — ABNORMAL LOW (ref 22–32)
Calcium: 8.2 mg/dL — ABNORMAL LOW (ref 8.9–10.3)
Chloride: 108 mmol/L (ref 98–111)
Creatinine, Ser: 0.82 mg/dL (ref 0.61–1.24)
GFR, Estimated: >60 mL/min (ref >60)
Glucose, Bld: 224 mg/dL — ABNORMAL HIGH (ref 70–99)
Potassium: 3.3 mmol/L — ABNORMAL LOW (ref 3.5–5.1)
Sodium: 134 mmol/L — ABNORMAL LOW (ref 135–145)
Total Bilirubin: 0.7 mg/dL (ref 0.3–1.2)
Total Protein: 5.9 g/dL — ABNORMAL LOW (ref 6.5–8.1)

## 2019-12-08 LAB — TROPONIN I (HIGH SENSITIVITY)
Troponin I (High Sensitivity): 123 ng/L (ref <18)
Troponin I (High Sensitivity): 100 ng/L (ref <18)

## 2019-12-08 LAB — CULTURE, BLOOD (ROUTINE X 2)

## 2019-12-08 LAB — GLUCOSE, CAPILLARY
Glucose-Capillary: 239 mg/dL — ABNORMAL HIGH (ref 70–99)
Glucose-Capillary: 254 mg/dL — ABNORMAL HIGH (ref 70–99)
Glucose-Capillary: 262 mg/dL — ABNORMAL HIGH (ref 70–99)
Glucose-Capillary: 164 mg/dL — ABNORMAL HIGH (ref 70–99)

## 2019-12-08 LAB — MAGNESIUM: Magnesium: 1.7 mg/dL (ref 1.7–2.4)

## 2019-12-08 MED ORDER — POTASSIUM CHLORIDE CRYS ER 20 MEQ PO TBCR
40.0000 meq | EXTENDED_RELEASE_TABLET | Freq: Once | ORAL | Status: AC
  Administered 2019-12-08: 40 meq via ORAL
  Filled 2019-12-08: qty 2

## 2019-12-08 MED ORDER — METOPROLOL TARTRATE 25 MG PO TABS
25.0000 mg | ORAL_TABLET | Freq: Four times a day (QID) | ORAL | Status: DC
  Administered 2019-12-08 – 2019-12-09 (×4): 25 mg via ORAL
  Filled 2019-12-08 (×4): qty 1

## 2019-12-08 MED ORDER — DILTIAZEM HCL 25 MG/5ML IV SOLN
INTRAVENOUS | Status: AC
  Filled 2019-12-08: qty 5

## 2019-12-08 MED ORDER — DILTIAZEM HCL-DEXTROSE 125-5 MG/125ML-% IV SOLN (PREMIX)
5.0000 mg/h | INTRAVENOUS | Status: DC
  Administered 2019-12-08: 5 mg/h via INTRAVENOUS
  Administered 2019-12-08: 15 mg/h via INTRAVENOUS
  Filled 2019-12-08 (×3): qty 125

## 2019-12-08 MED ORDER — SODIUM CHLORIDE 0.9 % IV BOLUS
500.0000 mL | Freq: Once | INTRAVENOUS | Status: AC
  Administered 2019-12-08: 500 mL via INTRAVENOUS

## 2019-12-08 MED ORDER — DILTIAZEM HCL 25 MG/5ML IV SOLN
15.0000 mg | Freq: Once | INTRAVENOUS | Status: AC
  Administered 2019-12-08: 15 mg via INTRAVENOUS

## 2019-12-08 MED ORDER — MAGNESIUM SULFATE 2 GM/50ML IV SOLN
2.0000 g | Freq: Once | INTRAVENOUS | Status: AC
  Administered 2019-12-08: 2 g via INTRAVENOUS
  Filled 2019-12-08: qty 50

## 2019-12-08 MED ORDER — AMIODARONE HCL IN DEXTROSE 360-4.14 MG/200ML-% IV SOLN
30.0000 mg/h | INTRAVENOUS | Status: DC
  Administered 2019-12-08 – 2019-12-09 (×2): 30 mg/h via INTRAVENOUS
  Filled 2019-12-08: qty 200

## 2019-12-08 MED ORDER — FUROSEMIDE 10 MG/ML IJ SOLN
40.0000 mg | Freq: Once | INTRAMUSCULAR | Status: AC
  Administered 2019-12-08: 40 mg via INTRAVENOUS
  Filled 2019-12-08: qty 4

## 2019-12-08 MED ORDER — POTASSIUM CHLORIDE 10 MEQ/100ML IV SOLN
10.0000 meq | INTRAVENOUS | Status: AC
  Administered 2019-12-08 (×3): 10 meq via INTRAVENOUS
  Filled 2019-12-08 (×3): qty 100

## 2019-12-08 MED ORDER — ENOXAPARIN SODIUM 100 MG/ML ~~LOC~~ SOLN
1.0000 mg/kg | Freq: Two times a day (BID) | SUBCUTANEOUS | Status: DC
  Administered 2019-12-08 – 2019-12-09 (×3): 90 mg via SUBCUTANEOUS
  Filled 2019-12-08 (×3): qty 1

## 2019-12-08 MED ORDER — AMIODARONE HCL IN DEXTROSE 360-4.14 MG/200ML-% IV SOLN
60.0000 mg/h | INTRAVENOUS | Status: DC
  Administered 2019-12-08: 60 mg/h via INTRAVENOUS
  Filled 2019-12-08 (×2): qty 200

## 2019-12-08 MED ORDER — MORPHINE SULFATE (PF) 2 MG/ML IV SOLN
INTRAVENOUS | Status: AC
  Administered 2019-12-08: 2 mg via INTRAVENOUS
  Filled 2019-12-08: qty 1

## 2019-12-08 MED ORDER — METOPROLOL TARTRATE 5 MG/5ML IV SOLN
INTRAVENOUS | Status: AC
  Filled 2019-12-08: qty 5

## 2019-12-08 MED ORDER — CHLORHEXIDINE GLUCONATE CLOTH 2 % EX PADS
6.0000 | MEDICATED_PAD | Freq: Every day | CUTANEOUS | Status: DC
  Administered 2019-12-08 – 2019-12-09 (×2): 6 via TOPICAL

## 2019-12-08 MED ORDER — INSULIN DETEMIR 100 UNIT/ML ~~LOC~~ SOLN
10.0000 [IU] | Freq: Every day | SUBCUTANEOUS | Status: DC
  Administered 2019-12-08 – 2019-12-09 (×2): 10 [IU] via SUBCUTANEOUS
  Filled 2019-12-08 (×5): qty 0.1

## 2019-12-08 NOTE — Progress Notes (Signed)
MD made aware of patient's CMP and troponin results. Also aware of decreasing BP with HR remaining between 145-160. Orders placed and followed.

## 2019-12-08 NOTE — Progress Notes (Addendum)
Patient went to bathroom with assistance from staff.  Patient got back in bed and complained of severe chills.  Patient heart rate became extremely elevated, complained of severe generalized pain, ekg done and was in a.fib with rvr.  Patient placed on non-rebreather and oxygen was in upper 80s. Patient is still alert and responsive during this time.  Rapid response called at [place] and staff responded appropriately. Medications given.  Metoprolol 5 mg given at 0013.  Cardizem 10mg  given at [place].  Morphine 2 mg given at 0016  MD at bedside and decision was made to move patient to ICU.  Patient moved to ICU bed four.  Notified significant other of transfer.

## 2019-12-08 NOTE — Progress Notes (Signed)
Inpatient Diabetes Program Recommendations  AACE/ADA: New Consensus Statement on Inpatient Glycemic Control (2015)  Target Ranges:  Prepandial:   less than 140 mg/dL      Peak postprandial:   less than 180 mg/dL (1-2 hours)      Critically ill patients:  140 - 180 mg/dL   Lab Results  Component Value Date   GLUCAP 254 (H) 12/08/2019   HGBA1C 9.9 (H) 12/05/2019    Review of Glycemic Control Results for Andrew Humphrey, Andrew Humphrey (MRN [id number]) as of 12/08/2019 11:42  Ref. Range 12/07/2019 20:50 12/08/2019 07:45 12/08/2019 11:00  Glucose-Capillary Latest Ref Range: 70 - 99 mg/dL 240 (H) 239 (H) 254 (H)   Diabetes history: DM2 Outpatient Diabetes medications: Glipizide XL 20 mg daily, Metformin 1000 mg BID Current orders for Inpatient glycemic control: Novolog 0-15 units TID with meals  Inpatient Diabetes Program Recommendations:   If to remain inpatient: Insulin: Please consider ordering Novolog 0-5 units QHS for bedtime correction and adding Levemir 8 units QD.  Thanks, Bronson Curb, MSN, RNC-OB Diabetes Coordinator [phone] (8a-5p)

## 2019-12-08 NOTE — Progress Notes (Signed)
Patient has now converted back into normal sinus rhythm with occasional unifocal PVC's. Patient is still on IV amiodarone at 60 mg/hr and IV Cardizem at 10 mg/hr. Rate is controlled in the 70's. Will continue to monitor.

## 2019-12-08 NOTE — Consult Note (Signed)
Cardiology Consultation:   Patient ID: Andrew Humphrey; [id number]; 07-30-1940   Admit date: 12/05/2019 Date of Consult: 12/08/2019  Primary Care Provider: Lanelle Bal, PA-C Primary Cardiologist: New Primary Electrophysiologist: None   Patient Profile:   Andrew Humphrey is a 79 y.o. male with a history of hypertension, type 2 diabetes mellitus, probable paroxysmal atrial fibrillation, and previous GI bleed who is being seen today for the evaluation of atrial fibrillation with RVR at the request of Dr. Roderic Palau.  History of Present Illness:   [name] is currently admitted to the hospital with suspected acalculous cholecystitis and aspiration pneumonitis, presenting with fevers, hypotension, also recurring abdominal pain with intermittent cramping and rigors.  He has been evaluated by the surgical team with plan for medical therapy at this point, continues on antibiotics with blood cultures positive for Klebsiella.  Yesterday while going to the bathroom he suddenly developed rapid atrial fibrillation, confirmed by ECG and treated with AV nodal blockers, currently on intravenous diltiazem at 15 mg/h.  Chart review finds hospitalization in April 2019 with rapid atrial fibrillation in association with chest pain and minor troponin elevations.  He was placed on heparin at that time and with intravenous diltiazem and spontaneously converted to sinus rhythm.  He was managed subsequently with Toprol-XL and placed on Eliquis.  Per discussion with family today he apparently was taken off Eliquis subsequently by his PCP in light of progressive anemia and suspected GI bleeding.  CHA2DS2-VASc score is 4.  Past Medical History:  Diagnosis Date  . Anemia   . Essential hypertension   . History of atrial fibrillation   . History of GI bleed   . Type 2 diabetes mellitus ([place])     Past Surgical History:  Procedure Laterality Date  . COLONOSCOPY N/A 11/09/2016   Procedure: COLONOSCOPY;   Surgeon: Danie Binder, MD;  Location: [facility];  Service: Endoscopy;  Laterality: N/A;  . [place]  . TONSILLECTOMY       Inpatient Medications: Scheduled Meds: . aspirin EC  81 mg Oral q morning - 10a  . Chlorhexidine Gluconate Cloth  6 each Topical Daily  . enoxaparin (LOVENOX) injection  1 mg/kg Subcutaneous Q12H  . insulin aspart  0-15 Units Subcutaneous TID WC   Continuous Infusions: . cefTRIAXone (ROCEPHIN)  IV 200 mL/hr at 12/08/19 0807  . diltiazem (CARDIZEM) infusion 15 mg/hr (12/08/19 0807)  . metronidazole Stopped (12/08/19 2376)   PRN Meds: acetaminophen **OR** acetaminophen, ipratropium-albuterol, loperamide, ondansetron **OR** ondansetron (ZOFRAN) IV  Allergies:   No Known Allergies  Social History:   Social History   Tobacco Use  . Smoking status: Never Smoker  . Smokeless tobacco: Never Used  Substance Use Topics  . Alcohol use: Yes    Comment: occasional beer    Family History:   The patient's family history includes Hypertension in his father. There is no history of Colon cancer or Colon polyps.  ROS:  Please see the history of present illness.  No sense of palpitations at present.  No chest pain.  Physical Exam/Data:   Vitals:   12/08/19 0700 12/08/19 0715 12/08/19 0748 12/08/19 0800  BP: 95/78 102/80  106/78  Pulse: (!) 116 (!) 59 (!) 123 (!) 121  Resp: 20 20 (!) 21 20  Temp:   (!) 97.4 F (36.3 C)   TempSrc:   Axillary   SpO2: 96% 97% 97% 96%  Weight:      Height:  Intake/Output Summary (Last 24 hours) at 12/08/2019 0928 Last data filed at 12/08/2019 0807 Gross per 24 hour  Intake 824.83 ml  Output 100 ml  Net 724.83 ml   Filed Weights   12/05/19 1125 12/05/19 2000 12/08/19 0100  Weight: 87.5 kg 90.2 kg 92.7 kg   Body mass index is 32.01 kg/m.   Gen: Elderly male in no distress. HEENT: Conjunctiva and lids normal, wearing a mask. Neck: Supple, no elevated JVP or carotid bruits, no  thyromegaly. Lungs: Decreased breath sounds without wheezing, nonlabored. Cardiac: Irregularly irregular, no S3, soft systolic murmur, no pericardial rub. Abdomen: Soft, nontender, bowel sounds present. Extremities: Mild ankle edema, distal pulses 2+. Skin: Warm and dry. Musculoskeletal: No kyphosis. Neuropsychiatric: Alert and oriented x3, affect grossly appropriate.  EKG:  An ECG dated 12/08/2019 was personally reviewed today and demonstrated:  Rapid atrial fibrillation with left anterior fascicular block, poor R wave progression rule out old anterior infarct pattern, and nonspecific ST segment abnormalities.  Telemetry:  I personally reviewed telemetry which shows atrial fibrillation.  Relevant CV Studies:  Echocardiogram 06/20/2017: Study Conclusions   - Left ventricle: The cavity size was normal. There was severe  concentric hypertrophy. Systolic function was normal. The  estimated ejection fraction was in the range of 60% to 65%. Wall  motion was normal; there were no regional wall motion  abnormalities. Features are consistent with a pseudonormal left  ventricular filling pattern, with concomitant abnormal relaxation  and increased filling pressure (grade 2 diastolic dysfunction).  Doppler parameters are consistent with high ventricular filling  pressure.  - Aortic valve: Severely calcified annulus. Trileaflet; moderately  thickened, moderately calcified leaflets.  - Mitral valve: Calcified annulus. There was mild regurgitation.  - Left atrium: The atrium was mildly dilated.  - Inferior vena cava: The vessel was mildly dilated.  Laboratory Data:  Chemistry Recent Labs  Lab 12/06/19 0447 12/07/19 0613 12/08/19 0149  NA 134* 134* 134*  K 4.1 3.7 3.3*  CL 106 107 108  CO2 19* 21* 16*  GLUCOSE 215* 280* 224*  BUN 19 21 20   CREATININE 0.82 0.68 0.82  CALCIUM 7.5* 7.7* 8.2*  GFRNONAA >60 >60 >60  ANIONGAP 9 6 10     Recent Labs  Lab 12/06/19 0447  12/07/19 0613 12/08/19 0149  PROT 5.6* 5.6* 5.9*  ALBUMIN 2.5* 2.5* 2.7*  AST 47* 29 32  ALT 36 29 30  ALKPHOS 54 54 103  BILITOT 0.9 0.7 0.7   Hematology Recent Labs  Lab 12/05/19 1202 12/06/19 0447 12/07/19 0613  WBC 32.0* 19.3* 10.8*  RBC 3.50* 2.88* 2.73*  HGB 11.5* 9.5* 9.0*  HCT 33.5* 27.9* 27.0*  MCV 95.7 96.9 98.9  MCH 32.9 33.0 33.0  MCHC 34.3 34.1 33.3  RDW 19.2* 19.2* 20.0*  PLT 168 124* 91*   Cardiac Enzymes Recent Labs  Lab 12/02/19 1500 12/02/19 1801 12/05/19 1231 12/08/19 0149 12/08/19 0451  TROPONINIHS 4 4 6  123* 100*    DDimer Recent Labs  Lab 12/06/19 1038  DDIMER 9.70*    Radiology/Studies:  DG Chest 2 View  Result Date: 12/05/2019 CLINICAL DATA:  Fever, weakness. EXAM: CHEST - 2 VIEW COMPARISON:  December 02, 2019. FINDINGS: Stable cardiomegaly. No pneumothorax or pleural effusion is noted. Both lungs are clear. The visualized skeletal structures are unremarkable. IMPRESSION: No active cardiopulmonary disease. Electronically Signed   By: Marijo Conception M.D.   On: 12/05/2019 13:24   CT ANGIO CHEST PE W OR WO CONTRAST  Addendum Date: 12/06/2019  ADDENDUM REPORT: 12/06/2019 14:02 ADDENDUM: In addition of findings outlined previously there is a nodule in the RIGHT mid chest that may represent a perifissural lymph node though based on size and in the acute setting. (This measures 8 mm) Non-contrast chest CT at 6-12 months is recommended. If the nodule is stable at time of repeat CT, then future CT at 18-24 months (from today's scan) is considered optional for low-risk patients, but is recommended for high-risk patients. This recommendation follows the consensus statement: Guidelines for Management of Incidental Pulmonary Nodules Detected on CT Images: From the Fleischner Society 2017; Radiology 2017; 284:228-243. These results will be called to the ordering clinician or representative by the Radiologist Assistant, and communication documented in the  PACS or [employer]. Electronically Signed   By: Zetta Bills M.D.   On: 12/06/2019 14:02   Result Date: 12/06/2019 CLINICAL DATA:  Acute abdominal pain and shortness of breath. EXAM: CT ANGIOGRAPHY CHEST CT ABDOMEN AND PELVIS WITH CONTRAST TECHNIQUE: Multidetector CT imaging of the chest was performed using the standard protocol during bolus administration of intravenous contrast. Multiplanar CT image reconstructions and MIPs were obtained to evaluate the vascular anatomy. Multidetector CT imaging of the abdomen and pelvis was performed using the standard protocol during bolus administration of intravenous contrast. CONTRAST:  1103mL OMNIPAQUE IOHEXOL 350 MG/ML SOLN COMPARISON:  Recent CT evaluation from 12/02/2019 FINDINGS: CTA CHEST FINDINGS Cardiovascular: Scattered atherosclerosis without signs of aneurysm of the thoracic aorta. Heart size mildly enlarged without pericardial effusion. Three-vessel coronary artery disease. 3.2 cm main pulmonary artery. No central or lobar level pulmonary embolism. Segmental branches with limited assessment in particular in the lung bases due to respiratory motion and bolus timing. Mediastinum/Nodes: Esophagus is normal. Thoracic inlet structures are unremarkable. No axillary lymphadenopathy no mediastinal or hilar lymphadenopathy Lungs/Pleura: Mild septal thickening, signs of some subpleural reticulation. Evidence of basilar atelectasis. No lobar consolidation. No pleural effusion. Airways are patent. Peri fissural nodule measures 8 x 7 mm (image 75 of series 4) Musculoskeletal: No chest wall mass. No acute musculoskeletal process. No destructive bone finding spinal degenerative changes. Review of the MIP images confirms the above findings. CT ABDOMEN and PELVIS FINDINGS Hepatobiliary: Wall enhancement and pericholecystic stranding. Segmental enhancement and or increased density along the common bile duct. No intrahepatic biliary duct dilation. Pancreas: Pancreas with  peripheral atrophy.  No ductal dilation. Spleen: Spleen normal in size and contour. Adrenals/Urinary Tract: Adrenal glands are normal. Symmetric renal enhancement.  No hydronephrosis. Stomach/Bowel: Small hiatal hernia. No acute small bowel process. Signs of diverticular disease. Stranding about the hepatic flexure of the colon is likely secondary to acute cholecystitis Vascular/Lymphatic: Calcified and noncalcified atheromatous plaque in the thoracic aorta extends into the abdominal aorta. No sign of aneurysm. There is no gastrohepatic or hepatoduodenal ligament lymphadenopathy. No retroperitoneal or mesenteric lymphadenopathy. No pelvic sidewall lymphadenopathy. Reproductive: Heterogeneous prostate similar to the prior exam. Other: Small to moderate fat containing bilateral inguinal hernias and small fat containing umbilical hernia. Musculoskeletal: No acute musculoskeletal process. No destructive bone finding. Spinal degenerative changes. Review of the MIP images confirms the above findings. IMPRESSION: 1. No signs of central or lobar level pulmonary embolism. Segmental branches with limited assessment in particular in the lung bases due to respiratory motion and bolus timing. 2. Mild septal thickening, signs of some subpleural reticulation, may represent mild interstitial lung disease. 3. CT findings of acute cholecystitis with interval increase in pericholecystic stranding when compared to prior imaging. HIDA scan could be helpful for added specificity  in the setting of acute cholecystitis if there is some doubt as to diagnosis based on clinical picture. 4. Segmental enhancement and or increased density along the common bile duct may represent choledocholithiasis or cholangitis as it is slightly longer segment than expected for biliary calculi. No intrahepatic biliary duct dilation. Follow-up CT with contrast or MRI/MRCP may be helpful to exclude underlying lesion. 5. Signs of diverticular disease. 6. Small  hiatal hernia. 7. Small to moderate fat containing bilateral inguinal hernias and small fat containing umbilical hernia. 8. Aortic atherosclerosis. Aortic Atherosclerosis (ICD10-I70.0). Electronically Signed: By: Zetta Bills M.D. On: 12/06/2019 13:39   CT ABDOMEN PELVIS W CONTRAST  Addendum Date: 12/06/2019   ADDENDUM REPORT: 12/06/2019 14:02 ADDENDUM: In addition of findings outlined previously there is a nodule in the RIGHT mid chest that may represent a perifissural lymph node though based on size and in the acute setting. (This measures 8 mm) Non-contrast chest CT at 6-12 months is recommended. If the nodule is stable at time of repeat CT, then future CT at 18-24 months (from today's scan) is considered optional for low-risk patients, but is recommended for high-risk patients. This recommendation follows the consensus statement: Guidelines for Management of Incidental Pulmonary Nodules Detected on CT Images: From the Fleischner Society 2017; Radiology 2017; 284:228-243. These results will be called to the ordering clinician or representative by the Radiologist Assistant, and communication documented in the PACS or [employer]. Electronically Signed   By: Zetta Bills M.D.   On: 12/06/2019 14:02   Result Date: 12/06/2019 CLINICAL DATA:  Acute abdominal pain and shortness of breath. EXAM: CT ANGIOGRAPHY CHEST CT ABDOMEN AND PELVIS WITH CONTRAST TECHNIQUE: Multidetector CT imaging of the chest was performed using the standard protocol during bolus administration of intravenous contrast. Multiplanar CT image reconstructions and MIPs were obtained to evaluate the vascular anatomy. Multidetector CT imaging of the abdomen and pelvis was performed using the standard protocol during bolus administration of intravenous contrast. CONTRAST:  123mL OMNIPAQUE IOHEXOL 350 MG/ML SOLN COMPARISON:  Recent CT evaluation from 12/02/2019 FINDINGS: CTA CHEST FINDINGS Cardiovascular: Scattered atherosclerosis without  signs of aneurysm of the thoracic aorta. Heart size mildly enlarged without pericardial effusion. Three-vessel coronary artery disease. 3.2 cm main pulmonary artery. No central or lobar level pulmonary embolism. Segmental branches with limited assessment in particular in the lung bases due to respiratory motion and bolus timing. Mediastinum/Nodes: Esophagus is normal. Thoracic inlet structures are unremarkable. No axillary lymphadenopathy no mediastinal or hilar lymphadenopathy Lungs/Pleura: Mild septal thickening, signs of some subpleural reticulation. Evidence of basilar atelectasis. No lobar consolidation. No pleural effusion. Airways are patent. Peri fissural nodule measures 8 x 7 mm (image 75 of series 4) Musculoskeletal: No chest wall mass. No acute musculoskeletal process. No destructive bone finding spinal degenerative changes. Review of the MIP images confirms the above findings. CT ABDOMEN and PELVIS FINDINGS Hepatobiliary: Wall enhancement and pericholecystic stranding. Segmental enhancement and or increased density along the common bile duct. No intrahepatic biliary duct dilation. Pancreas: Pancreas with peripheral atrophy.  No ductal dilation. Spleen: Spleen normal in size and contour. Adrenals/Urinary Tract: Adrenal glands are normal. Symmetric renal enhancement.  No hydronephrosis. Stomach/Bowel: Small hiatal hernia. No acute small bowel process. Signs of diverticular disease. Stranding about the hepatic flexure of the colon is likely secondary to acute cholecystitis Vascular/Lymphatic: Calcified and noncalcified atheromatous plaque in the thoracic aorta extends into the abdominal aorta. No sign of aneurysm. There is no gastrohepatic or hepatoduodenal ligament lymphadenopathy. No retroperitoneal or mesenteric lymphadenopathy.  No pelvic sidewall lymphadenopathy. Reproductive: Heterogeneous prostate similar to the prior exam. Other: Small to moderate fat containing bilateral inguinal hernias and small  fat containing umbilical hernia. Musculoskeletal: No acute musculoskeletal process. No destructive bone finding. Spinal degenerative changes. Review of the MIP images confirms the above findings. IMPRESSION: 1. No signs of central or lobar level pulmonary embolism. Segmental branches with limited assessment in particular in the lung bases due to respiratory motion and bolus timing. 2. Mild septal thickening, signs of some subpleural reticulation, may represent mild interstitial lung disease. 3. CT findings of acute cholecystitis with interval increase in pericholecystic stranding when compared to prior imaging. HIDA scan could be helpful for added specificity in the setting of acute cholecystitis if there is some doubt as to diagnosis based on clinical picture. 4. Segmental enhancement and or increased density along the common bile duct may represent choledocholithiasis or cholangitis as it is slightly longer segment than expected for biliary calculi. No intrahepatic biliary duct dilation. Follow-up CT with contrast or MRI/MRCP may be helpful to exclude underlying lesion. 5. Signs of diverticular disease. 6. Small hiatal hernia. 7. Small to moderate fat containing bilateral inguinal hernias and small fat containing umbilical hernia. 8. Aortic atherosclerosis. Aortic Atherosclerosis (ICD10-I70.0). Electronically Signed: By: Zetta Bills M.D. On: 12/06/2019 13:39   MR 3D Recon At Scanner  Result Date: 12/06/2019 CLINICAL DATA:  Right upper quadrant abdominal pain EXAM: MRI ABDOMEN WITHOUT AND WITH CONTRAST (INCLUDING MRCP) TECHNIQUE: Multiplanar multisequence MR imaging of the abdomen was performed both before and after the administration of intravenous contrast. Heavily T2-weighted images of the biliary and pancreatic ducts were obtained, and three-dimensional MRCP images were rendered by post processing. CONTRAST:  60mL GADAVIST GADOBUTROL 1 MMOL/ML IV SOLN COMPARISON:  CT abdomen 12/06/2019 FINDINGS: Despite  efforts by the technologist and patient, motion artifact is present on today's exam and could not be eliminated. This reduces exam sensitivity and specificity. Lower chest: Mildly accentuated interstitium. Mild cardiomegaly. No significant pleural effusion. Hepatobiliary: Gallbladder wall thickening is present along with edema in the adipose tissues adjacent to the gallbladder favoring gallbladder inflammation. No gallstones are identified. No gas is identified in the gallbladder wall. On post-contrast images, there is only mildly accentuated enhancement in the gallbladder wall. No gallbladder mass is evident. 0.6 cm cyst in segment 4 of the liver on image 13 of series 12. No biliary dilatation is observed. No significant abnormal hepatic enhancement. Pancreas:  Unremarkable Spleen:  Unremarkable Adrenals/Urinary Tract: 1.0 by 0.6 cm right adrenal myelolipoma. Mild scarring of the right mid kidney posteriorly. Stomach/Bowel: Unremarkable Vascular/Lymphatic: Aortoiliac atherosclerotic vascular disease. No pathologic adenopathy. Other:  No supplemental non-categorized findings. Musculoskeletal: Thoracic and lumbar spondylosis and degenerative disc disease. IMPRESSION: 1. Gallbladder wall thickening and edema in the adipose tissues adjacent to the gallbladder favoring gallbladder inflammation/acalculous cholecystitis. No gallstones are identified. 2. No biliary dilatation or choledocholithiasis is identified. 3. Small right adrenal myelolipoma. 4. Mild scarring of the right mid kidney posteriorly. 5. Mild cardiomegaly. 6. Thoracic and lumbar spondylosis and degenerative disc disease. Electronically Signed   By: Van Clines M.D.   On: 12/06/2019 16:23   US Abdomen Limited  Result Date: 12/05/2019 CLINICAL DATA:  Right upper quadrant abdominal pain. EXAM: ULTRASOUND ABDOMEN LIMITED RIGHT UPPER QUADRANT COMPARISON:  December 02, 2019. FINDINGS: Gallbladder: No gallstones or wall thickening visualized. No  sonographic Murphy sign noted by sonographer. Common bile duct: Diameter: 2 mm which is within normal limits. Liver: Increased echogenicity of hepatic parenchyma is noted suggesting  hepatic steatosis with probable fatty sparing adjacent to gallbladder fossa. Portal vein is patent on color Doppler imaging with normal direction of blood flow towards the liver. Other: None. IMPRESSION: Increased echogenicity of hepatic parenchyma is noted with probable fatty sparing adjacent to gallbladder fossa. Electronically Signed   By: Marijo Conception M.D.   On: 12/05/2019 13:49   DG CHEST PORT 1 VIEW  Result Date: 12/08/2019 CLINICAL DATA:  Shortness of breath EXAM: PORTABLE CHEST 1 VIEW COMPARISON:  12/05/2019 FINDINGS: Bilateral interstitial opacities, likely pulmonary edema. No pleural effusion or pneumothorax. Normal cardiomediastinal contours. IMPRESSION: Moderate interstitial pulmonary edema. Electronically Signed   By: Ulyses Jarred M.D.   On: 12/08/2019 01:03   MR ABDOMEN MRCP W WO CONTAST  Result Date: 12/06/2019 CLINICAL DATA:  Right upper quadrant abdominal pain EXAM: MRI ABDOMEN WITHOUT AND WITH CONTRAST (INCLUDING MRCP) TECHNIQUE: Multiplanar multisequence MR imaging of the abdomen was performed both before and after the administration of intravenous contrast. Heavily T2-weighted images of the biliary and pancreatic ducts were obtained, and three-dimensional MRCP images were rendered by post processing. CONTRAST:  16mL GADAVIST GADOBUTROL 1 MMOL/ML IV SOLN COMPARISON:  CT abdomen 12/06/2019 FINDINGS: Despite efforts by the technologist and patient, motion artifact is present on today's exam and could not be eliminated. This reduces exam sensitivity and specificity. Lower chest: Mildly accentuated interstitium. Mild cardiomegaly. No significant pleural effusion. Hepatobiliary: Gallbladder wall thickening is present along with edema in the adipose tissues adjacent to the gallbladder favoring gallbladder  inflammation. No gallstones are identified. No gas is identified in the gallbladder wall. On post-contrast images, there is only mildly accentuated enhancement in the gallbladder wall. No gallbladder mass is evident. 0.6 cm cyst in segment 4 of the liver on image 13 of series 12. No biliary dilatation is observed. No significant abnormal hepatic enhancement. Pancreas:  Unremarkable Spleen:  Unremarkable Adrenals/Urinary Tract: 1.0 by 0.6 cm right adrenal myelolipoma. Mild scarring of the right mid kidney posteriorly. Stomach/Bowel: Unremarkable Vascular/Lymphatic: Aortoiliac atherosclerotic vascular disease. No pathologic adenopathy. Other:  No supplemental non-categorized findings. Musculoskeletal: Thoracic and lumbar spondylosis and degenerative disc disease. IMPRESSION: 1. Gallbladder wall thickening and edema in the adipose tissues adjacent to the gallbladder favoring gallbladder inflammation/acalculous cholecystitis. No gallstones are identified. 2. No biliary dilatation or choledocholithiasis is identified. 3. Small right adrenal myelolipoma. 4. Mild scarring of the right mid kidney posteriorly. 5. Mild cardiomegaly. 6. Thoracic and lumbar spondylosis and degenerative disc disease. Electronically Signed   By: Van Clines M.D.   On: 12/06/2019 16:23    Assessment and Plan:   1.  Recurrent atrial fibrillation with RVR, history of paroxysmal atrial fibrillation with episode documented in April 2019 as discussed above.  CHA2DS2-VASc score is 4.  Episode occurred yesterday while patient was using the bathroom and he persists in atrial fibrillation today on intravenous diltiazem 15 mg/h.  2.  Minor elevation in high-sensitivity troponin I, 123 and 100, most consistent with demand ischemia in the setting of rapid atrial fibrillation.  3.  History of GI bleed per chart review.  My understanding is that he had progressive anemia while on Eliquis in the past and this was discontinued.  His recent  hemoglobin is 9.0 and he is also thrombocytopenic with platelet count 91.  4.  Essential hypertension, was on Toprol-XL 100 mg daily as an outpatient, not continued during current hospitalization with relatively low blood pressures.  Withdrawal of beta-blocker could be a potential contributor to recurring arrhythmia as well.  5.  Type  2 diabetes mellitus, on Glucotrol XL as an outpatient.  Hemoglobin A1c 9.9%.  6.  Suspected acalculous cholecystitis and potentially aspiration pneumonitis with SIRS and blood cultures positive for Klebsiella.  He has been treated with supportive measures and antibiotics, not felt to have surgical indication per general surgery consultation.  I reviewed the chart and discussed the situation with the patient, his wife, and also daughter by phone.  Agree with intravenous diltiazem, will also add IV amiodarone in the short-term (LFTs are normal) to assist with further heart rate control and potentially conversion.  Rhythm onset is within the last 12 hours and for now would hold off on anticoagulation.  It does not sound like he is an optimal candidate for long-term anticoagulation based on history of progressive anemia and GI bleeding when on Eliquis.  Would eventually obtain a follow-up echocardiogram once heart rate is better controlled.  Continue supportive measures.  Signed, Rozann Lesches, MD  12/08/2019 9:28 AM

## 2019-12-08 NOTE — Progress Notes (Signed)
Subjective: Events of last night noted.  Patient denies any right upper quadrant abdominal pain.  Apparently this happened while he was getting up to go to the bathroom.  Objective: Vital signs in last 24 hours: Temp:  [97.4 F (36.3 C)-98.3 F (36.8 C)] 97.4 F (36.3 C) (10/14 0748) Pulse Rate:  [34-160] 123 (10/14 0748) Resp:  [18-43] 21 (10/14 0748) BP: (85-144)/(45-89) 102/80 (10/14 0715) SpO2:  [92 %-98 %] 97 % (10/14 0748) Weight:  [92.7 kg] 92.7 kg (10/14 0100) Last BM Date: 12/07/19  Intake/Output from previous day: 10/13 0701 - 10/14 0700 In: 665.8 [I.V.:77.3; IV Piggyback:588.5] Out: 100 [Urine:100] Intake/Output this shift: No intake/output data recorded.  General appearance: alert, cooperative and no distress GI: soft, non-tender; bowel sounds normal; no masses,  no organomegaly  Lab Results:  Recent Labs    12/06/19 0447 12/07/19 0613  WBC 19.3* 10.8*  HGB 9.5* 9.0*  HCT 27.9* 27.0*  PLT 124* 91*   BMET Recent Labs    12/07/19 0613 12/08/19 0149  NA 134* 134*  K 3.7 3.3*  CL 107 108  CO2 21* 16*  GLUCOSE 280* 224*  BUN 21 20  CREATININE 0.68 0.82  CALCIUM 7.7* 8.2*   PT/INR No results for input(s): LABPROT, INR in the last 72 hours.  Studies/Results: CT ANGIO CHEST PE W OR WO CONTRAST  Addendum Date: 12/06/2019   ADDENDUM REPORT: 12/06/2019 14:02 ADDENDUM: In addition of findings outlined previously there is a nodule in the RIGHT mid chest that may represent a perifissural lymph node though based on size and in the acute setting. (This measures 8 mm) Non-contrast chest CT at 6-12 months is recommended. If the nodule is stable at time of repeat CT, then future CT at 18-24 months (from today's scan) is considered optional for low-risk patients, but is recommended for high-risk patients. This recommendation follows the consensus statement: Guidelines for Management of Incidental Pulmonary Nodules Detected on CT Images: From the Fleischner Society  2017; Radiology 2017; 284:228-243. These results will be called to the ordering clinician or representative by the Radiologist Assistant, and communication documented in the PACS or [employer]. Electronically Signed   By: Zetta Bills M.D.   On: 12/06/2019 14:02   Result Date: 12/06/2019 CLINICAL DATA:  Acute abdominal pain and shortness of breath. EXAM: CT ANGIOGRAPHY CHEST CT ABDOMEN AND PELVIS WITH CONTRAST TECHNIQUE: Multidetector CT imaging of the chest was performed using the standard protocol during bolus administration of intravenous contrast. Multiplanar CT image reconstructions and MIPs were obtained to evaluate the vascular anatomy. Multidetector CT imaging of the abdomen and pelvis was performed using the standard protocol during bolus administration of intravenous contrast. CONTRAST:  111mL OMNIPAQUE IOHEXOL 350 MG/ML SOLN COMPARISON:  Recent CT evaluation from 12/02/2019 FINDINGS: CTA CHEST FINDINGS Cardiovascular: Scattered atherosclerosis without signs of aneurysm of the thoracic aorta. Heart size mildly enlarged without pericardial effusion. Three-vessel coronary artery disease. 3.2 cm main pulmonary artery. No central or lobar level pulmonary embolism. Segmental branches with limited assessment in particular in the lung bases due to respiratory motion and bolus timing. Mediastinum/Nodes: Esophagus is normal. Thoracic inlet structures are unremarkable. No axillary lymphadenopathy no mediastinal or hilar lymphadenopathy Lungs/Pleura: Mild septal thickening, signs of some subpleural reticulation. Evidence of basilar atelectasis. No lobar consolidation. No pleural effusion. Airways are patent. Peri fissural nodule measures 8 x 7 mm (image 75 of series 4) Musculoskeletal: No chest wall mass. No acute musculoskeletal process. No destructive bone finding spinal degenerative changes. Review of the  MIP images confirms the above findings. CT ABDOMEN and PELVIS FINDINGS Hepatobiliary: Wall  enhancement and pericholecystic stranding. Segmental enhancement and or increased density along the common bile duct. No intrahepatic biliary duct dilation. Pancreas: Pancreas with peripheral atrophy.  No ductal dilation. Spleen: Spleen normal in size and contour. Adrenals/Urinary Tract: Adrenal glands are normal. Symmetric renal enhancement.  No hydronephrosis. Stomach/Bowel: Small hiatal hernia. No acute small bowel process. Signs of diverticular disease. Stranding about the hepatic flexure of the colon is likely secondary to acute cholecystitis Vascular/Lymphatic: Calcified and noncalcified atheromatous plaque in the thoracic aorta extends into the abdominal aorta. No sign of aneurysm. There is no gastrohepatic or hepatoduodenal ligament lymphadenopathy. No retroperitoneal or mesenteric lymphadenopathy. No pelvic sidewall lymphadenopathy. Reproductive: Heterogeneous prostate similar to the prior exam. Other: Small to moderate fat containing bilateral inguinal hernias and small fat containing umbilical hernia. Musculoskeletal: No acute musculoskeletal process. No destructive bone finding. Spinal degenerative changes. Review of the MIP images confirms the above findings. IMPRESSION: 1. No signs of central or lobar level pulmonary embolism. Segmental branches with limited assessment in particular in the lung bases due to respiratory motion and bolus timing. 2. Mild septal thickening, signs of some subpleural reticulation, may represent mild interstitial lung disease. 3. CT findings of acute cholecystitis with interval increase in pericholecystic stranding when compared to prior imaging. HIDA scan could be helpful for added specificity in the setting of acute cholecystitis if there is some doubt as to diagnosis based on clinical picture. 4. Segmental enhancement and or increased density along the common bile duct may represent choledocholithiasis or cholangitis as it is slightly longer segment than expected for  biliary calculi. No intrahepatic biliary duct dilation. Follow-up CT with contrast or MRI/MRCP may be helpful to exclude underlying lesion. 5. Signs of diverticular disease. 6. Small hiatal hernia. 7. Small to moderate fat containing bilateral inguinal hernias and small fat containing umbilical hernia. 8. Aortic atherosclerosis. Aortic Atherosclerosis (ICD10-I70.0). Electronically Signed: By: Zetta Bills M.D. On: 12/06/2019 13:39   CT ABDOMEN PELVIS W CONTRAST  Addendum Date: 12/06/2019   ADDENDUM REPORT: 12/06/2019 14:02 ADDENDUM: In addition of findings outlined previously there is a nodule in the RIGHT mid chest that may represent a perifissural lymph node though based on size and in the acute setting. (This measures 8 mm) Non-contrast chest CT at 6-12 months is recommended. If the nodule is stable at time of repeat CT, then future CT at 18-24 months (from today's scan) is considered optional for low-risk patients, but is recommended for high-risk patients. This recommendation follows the consensus statement: Guidelines for Management of Incidental Pulmonary Nodules Detected on CT Images: From the Fleischner Society 2017; Radiology 2017; 284:228-243. These results will be called to the ordering clinician or representative by the Radiologist Assistant, and communication documented in the PACS or [employer]. Electronically Signed   By: Zetta Bills M.D.   On: 12/06/2019 14:02   Result Date: 12/06/2019 CLINICAL DATA:  Acute abdominal pain and shortness of breath. EXAM: CT ANGIOGRAPHY CHEST CT ABDOMEN AND PELVIS WITH CONTRAST TECHNIQUE: Multidetector CT imaging of the chest was performed using the standard protocol during bolus administration of intravenous contrast. Multiplanar CT image reconstructions and MIPs were obtained to evaluate the vascular anatomy. Multidetector CT imaging of the abdomen and pelvis was performed using the standard protocol during bolus administration of intravenous  contrast. CONTRAST:  165mL OMNIPAQUE IOHEXOL 350 MG/ML SOLN COMPARISON:  Recent CT evaluation from 12/02/2019 FINDINGS: CTA CHEST FINDINGS Cardiovascular: Scattered atherosclerosis without signs  of aneurysm of the thoracic aorta. Heart size mildly enlarged without pericardial effusion. Three-vessel coronary artery disease. 3.2 cm main pulmonary artery. No central or lobar level pulmonary embolism. Segmental branches with limited assessment in particular in the lung bases due to respiratory motion and bolus timing. Mediastinum/Nodes: Esophagus is normal. Thoracic inlet structures are unremarkable. No axillary lymphadenopathy no mediastinal or hilar lymphadenopathy Lungs/Pleura: Mild septal thickening, signs of some subpleural reticulation. Evidence of basilar atelectasis. No lobar consolidation. No pleural effusion. Airways are patent. Peri fissural nodule measures 8 x 7 mm (image 75 of series 4) Musculoskeletal: No chest wall mass. No acute musculoskeletal process. No destructive bone finding spinal degenerative changes. Review of the MIP images confirms the above findings. CT ABDOMEN and PELVIS FINDINGS Hepatobiliary: Wall enhancement and pericholecystic stranding. Segmental enhancement and or increased density along the common bile duct. No intrahepatic biliary duct dilation. Pancreas: Pancreas with peripheral atrophy.  No ductal dilation. Spleen: Spleen normal in size and contour. Adrenals/Urinary Tract: Adrenal glands are normal. Symmetric renal enhancement.  No hydronephrosis. Stomach/Bowel: Small hiatal hernia. No acute small bowel process. Signs of diverticular disease. Stranding about the hepatic flexure of the colon is likely secondary to acute cholecystitis Vascular/Lymphatic: Calcified and noncalcified atheromatous plaque in the thoracic aorta extends into the abdominal aorta. No sign of aneurysm. There is no gastrohepatic or hepatoduodenal ligament lymphadenopathy. No retroperitoneal or mesenteric  lymphadenopathy. No pelvic sidewall lymphadenopathy. Reproductive: Heterogeneous prostate similar to the prior exam. Other: Small to moderate fat containing bilateral inguinal hernias and small fat containing umbilical hernia. Musculoskeletal: No acute musculoskeletal process. No destructive bone finding. Spinal degenerative changes. Review of the MIP images confirms the above findings. IMPRESSION: 1. No signs of central or lobar level pulmonary embolism. Segmental branches with limited assessment in particular in the lung bases due to respiratory motion and bolus timing. 2. Mild septal thickening, signs of some subpleural reticulation, may represent mild interstitial lung disease. 3. CT findings of acute cholecystitis with interval increase in pericholecystic stranding when compared to prior imaging. HIDA scan could be helpful for added specificity in the setting of acute cholecystitis if there is some doubt as to diagnosis based on clinical picture. 4. Segmental enhancement and or increased density along the common bile duct may represent choledocholithiasis or cholangitis as it is slightly longer segment than expected for biliary calculi. No intrahepatic biliary duct dilation. Follow-up CT with contrast or MRI/MRCP may be helpful to exclude underlying lesion. 5. Signs of diverticular disease. 6. Small hiatal hernia. 7. Small to moderate fat containing bilateral inguinal hernias and small fat containing umbilical hernia. 8. Aortic atherosclerosis. Aortic Atherosclerosis (ICD10-I70.0). Electronically Signed: By: Zetta Bills M.D. On: 12/06/2019 13:39   MR 3D Recon At Scanner  Result Date: 12/06/2019 CLINICAL DATA:  Right upper quadrant abdominal pain EXAM: MRI ABDOMEN WITHOUT AND WITH CONTRAST (INCLUDING MRCP) TECHNIQUE: Multiplanar multisequence MR imaging of the abdomen was performed both before and after the administration of intravenous contrast. Heavily T2-weighted images of the biliary and pancreatic  ducts were obtained, and three-dimensional MRCP images were rendered by post processing. CONTRAST:  41mL GADAVIST GADOBUTROL 1 MMOL/ML IV SOLN COMPARISON:  CT abdomen 12/06/2019 FINDINGS: Despite efforts by the technologist and patient, motion artifact is present on today's exam and could not be eliminated. This reduces exam sensitivity and specificity. Lower chest: Mildly accentuated interstitium. Mild cardiomegaly. No significant pleural effusion. Hepatobiliary: Gallbladder wall thickening is present along with edema in the adipose tissues adjacent to the gallbladder favoring gallbladder inflammation. No  gallstones are identified. No gas is identified in the gallbladder wall. On post-contrast images, there is only mildly accentuated enhancement in the gallbladder wall. No gallbladder mass is evident. 0.6 cm cyst in segment 4 of the liver on image 13 of series 12. No biliary dilatation is observed. No significant abnormal hepatic enhancement. Pancreas:  Unremarkable Spleen:  Unremarkable Adrenals/Urinary Tract: 1.0 by 0.6 cm right adrenal myelolipoma. Mild scarring of the right mid kidney posteriorly. Stomach/Bowel: Unremarkable Vascular/Lymphatic: Aortoiliac atherosclerotic vascular disease. No pathologic adenopathy. Other:  No supplemental non-categorized findings. Musculoskeletal: Thoracic and lumbar spondylosis and degenerative disc disease. IMPRESSION: 1. Gallbladder wall thickening and edema in the adipose tissues adjacent to the gallbladder favoring gallbladder inflammation/acalculous cholecystitis. No gallstones are identified. 2. No biliary dilatation or choledocholithiasis is identified. 3. Small right adrenal myelolipoma. 4. Mild scarring of the right mid kidney posteriorly. 5. Mild cardiomegaly. 6. Thoracic and lumbar spondylosis and degenerative disc disease. Electronically Signed   By: Van Clines M.D.   On: 12/06/2019 16:23   DG CHEST PORT 1 VIEW  Result Date: 12/08/2019 CLINICAL DATA:   Shortness of breath EXAM: PORTABLE CHEST 1 VIEW COMPARISON:  12/05/2019 FINDINGS: Bilateral interstitial opacities, likely pulmonary edema. No pleural effusion or pneumothorax. Normal cardiomediastinal contours. IMPRESSION: Moderate interstitial pulmonary edema. Electronically Signed   By: Ulyses Jarred M.D.   On: 12/08/2019 01:03   MR ABDOMEN MRCP W WO CONTAST  Result Date: 12/06/2019 CLINICAL DATA:  Right upper quadrant abdominal pain EXAM: MRI ABDOMEN WITHOUT AND WITH CONTRAST (INCLUDING MRCP) TECHNIQUE: Multiplanar multisequence MR imaging of the abdomen was performed both before and after the administration of intravenous contrast. Heavily T2-weighted images of the biliary and pancreatic ducts were obtained, and three-dimensional MRCP images were rendered by post processing. CONTRAST:  78mL GADAVIST GADOBUTROL 1 MMOL/ML IV SOLN COMPARISON:  CT abdomen 12/06/2019 FINDINGS: Despite efforts by the technologist and patient, motion artifact is present on today's exam and could not be eliminated. This reduces exam sensitivity and specificity. Lower chest: Mildly accentuated interstitium. Mild cardiomegaly. No significant pleural effusion. Hepatobiliary: Gallbladder wall thickening is present along with edema in the adipose tissues adjacent to the gallbladder favoring gallbladder inflammation. No gallstones are identified. No gas is identified in the gallbladder wall. On post-contrast images, there is only mildly accentuated enhancement in the gallbladder wall. No gallbladder mass is evident. 0.6 cm cyst in segment 4 of the liver on image 13 of series 12. No biliary dilatation is observed. No significant abnormal hepatic enhancement. Pancreas:  Unremarkable Spleen:  Unremarkable Adrenals/Urinary Tract: 1.0 by 0.6 cm right adrenal myelolipoma. Mild scarring of the right mid kidney posteriorly. Stomach/Bowel: Unremarkable Vascular/Lymphatic: Aortoiliac atherosclerotic vascular disease. No pathologic adenopathy.  Other:  No supplemental non-categorized findings. Musculoskeletal: Thoracic and lumbar spondylosis and degenerative disc disease. IMPRESSION: 1. Gallbladder wall thickening and edema in the adipose tissues adjacent to the gallbladder favoring gallbladder inflammation/acalculous cholecystitis. No gallstones are identified. 2. No biliary dilatation or choledocholithiasis is identified. 3. Small right adrenal myelolipoma. 4. Mild scarring of the right mid kidney posteriorly. 5. Mild cardiomegaly. 6. Thoracic and lumbar spondylosis and degenerative disc disease. Electronically Signed   By: Van Clines M.D.   On: 12/06/2019 16:23    Anti-infectives: Anti-infectives (From admission, onward)   Start     Dose/Rate Route Frequency Ordered Stop   12/07/19 1400  metroNIDAZOLE (FLAGYL) IVPB 500 mg        500 mg 100 mL/hr over 60 Minutes Intravenous Every 8 hours 12/07/19 1047  12/07/19 1100  cefTRIAXone (ROCEPHIN) 2 g in sodium chloride 0.9 % 100 mL IVPB        2 g 200 mL/hr over 30 Minutes Intravenous Every 24 hours 12/07/19 1011     12/06/19 2000  meropenem (MERREM) 1 g in sodium chloride 0.9 % 100 mL IVPB  Status:  Discontinued        1 g 200 mL/hr over 30 Minutes Intravenous Every 8 hours 12/06/19 1839 12/07/19 1011   12/06/19 1000  vancomycin (VANCOREADY) IVPB 1250 mg/250 mL  Status:  Discontinued        1,250 mg 166.7 mL/hr over 90 Minutes Intravenous Every 12 hours 12/06/19 0856 12/06/19 1839   12/06/19 0900  ceFEPIme (MAXIPIME) 2 g in sodium chloride 0.9 % 100 mL IVPB  Status:  Discontinued        2 g 200 mL/hr over 30 Minutes Intravenous Every 8 hours 12/06/19 0856 12/06/19 1839   12/06/19 0000  metroNIDAZOLE (FLAGYL) IVPB 500 mg  Status:  Discontinued        500 mg 100 mL/hr over 60 Minutes Intravenous Every 8 hours 12/05/19 1956 12/06/19 1839   12/05/19 1715  metroNIDAZOLE (FLAGYL) IVPB 500 mg  Status:  Discontinued        500 mg 100 mL/hr over 60 Minutes Intravenous Every 8 hours  12/05/19 1708 12/05/19 1956   12/05/19 1600  vancomycin (VANCOREADY) IVPB 2000 mg/400 mL        2,000 mg 200 mL/hr over 120 Minutes Intravenous  Once 12/05/19 1503 12/05/19 1929   12/05/19 1415  ceFEPIme (MAXIPIME) 2 g in sodium chloride 0.9 % 100 mL IVPB        2 g 200 mL/hr over 30 Minutes Intravenous  Once 12/05/19 1407 12/05/19 1543   12/05/19 1415  metroNIDAZOLE (FLAGYL) IVPB 500 mg        500 mg 100 mL/hr over 60 Minutes Intravenous  Once 12/05/19 1407 12/05/19 1700   12/05/19 1415  vancomycin (VANCOCIN) IVPB 1000 mg/200 mL premix  Status:  Discontinued        1,000 mg 200 mL/hr over 60 Minutes Intravenous  Once 12/05/19 1407 12/05/19 1502      Assessment/Plan: Impression: Currently in ICU for rapid atrial fibrillation.  His liver enzyme tests are within normal limits.  Clinically does not have evidence of biliary colic.  Nothing further to add from the surgery standpoint.  LOS: 3 days    [facility] Signs 12/08/2019

## 2019-12-08 NOTE — Progress Notes (Signed)
Rapid response called for Afib RVR with rate 232. This occurred after getting up to go to the bathroom. Upon my arrival patient had increased work of breathing, on NRB, HR 170s, but admitted to no pain. Metoprolol 5mg , then Cardizem 10mg , then cardizem 15mg  given. Patient moved to ICU and started on cardizem drip. On 15mg  cardizem drip, HR remains 130-145. Now BP soft at 145. NS bolus started. CXR, trops, CMP, Mag are all still pending.

## 2019-12-08 NOTE — Progress Notes (Signed)
PT Cancellation Note  Patient Details Name: Andrew Humphrey MRN: [id number] DOB: [birth year]   Cancelled Treatment:    Reason Eval/Treat Not Completed: Medical issues which prohibited therapy.  Patient transferred to a higher level of care and will need new PT consult resume therapy when patient is medically stable.  Thank you.    7:53 AM, 12/08/19 Lonell Grandchild, MPT Physical Therapist with [facility] [phone] office [phone] mobile phone

## 2019-12-08 NOTE — Progress Notes (Signed)
PROGRESS NOTE  KRISTA SOM WGN:[id number] DOB: [birth year] DOA: 12/05/2019 PCP: Lanelle Bal, PA-C  Brief History:   79 y.o. male with medical history of atrial fibrillation, hypertension, diabetes mellitus type 2, GI bleed presenting with generalized weakness and fevers and chills.  The patient was initially seen in the emergency department on 12/02/2019 with left upper quadrant abdominal pain.  CT of the abdomen and pelvis on 1021 showed mild pericholecystic inflammatory changes toward the gallbladder neck and moderate gallbladder distention.  There was also some segmental thickening of the rectosigmoid colon with minimal pericolonic haze.  Patient was treated symptomatically and discharged home in stable condition.  On 12/03/2019 and 12/04/2019, the patient states that he was feeling well.  He had denied any fevers, chills, chest pain or shortness breath, coughing, hemoptysis, nausea, vomiting, diarrhea.  He states that his abdominal pain had actually improved.  The patient's significant other is also helping with the history.  Apparently, his significant other stated that he began having some mild abdominal cramping again on the evening of 12/04/2019 although the patient denies this.  The patient was eating fine over the weekend.  He denied headache or neck pain.  He does denies any new medications. On the morning of 12/05/2019, his significant other came over to check up on him and found him on the floor lethargic.  EMS was activated.  Apparently, the patient had bowel incontinence and emesis on him. In the emergency department, the patient is alert and oriented x4.  He actually denies any complaints presently including nausea, vomiting, diarrhea, domino pain, dysuria, hematuria.  He did remember being incontinent of stool, but stated that he was too weak to get up.  He denies any alcohol or illegal drug use.  He does drink alcohol rarely.  In the emergency department, the patient was  febrile to 101.4 F with hypotension with systolic blood pressure in the upper 80s.  BMP showed a sodium 133, potassium 3.2, serum creatinine 1.29.  LFTs were essentially unremarkable with lipase 19.  Total bilirubin was 1.8.  BBC 32.0, hemoglobin 9.5, platelets 168,000.  Right upper quadrant abdominal sono showed no gallstones or gallbladder wall thickening.  There was hepatic echogenicity.  There was fatty sparing adjacent to the gallbladder fossa.  Lactic acid was 5.4.The patient was given fluids and IV antibiotics.  Assessment/Plan: SIRS -Source unclear at this time although suspect acalculous cholecystitis, aspiration pneumonitis -Patient was treated empirically with IV antibiotics -Blood culture positive for Klebsiella -Overall, he is improving with IV antibiotics -Seen by general surgery, no indication for surgical management at this time -We will continue on oral antibiotics to complete 10-day course  Hyponatremia -Secondary to volume depletion -Continue IV fluids  Acute kidney injury -Secondary to sepsis and volume depletion -Baseline creatinine 0.6-0.8 -Presented with serum creatinine 1.29 -improvedwith IVF, creatinine back to baseline  Hypokalemia -Replete -Magnesium 1.7  Uncontrolled diabetes mellitus type 2 with hyperglycemia -Hemoglobin A1c--9.9 -Holding glipizide and Metformin -NovoLog sliding scale -Blood sugars remain elevated, add basal insulin   Generalized weakness -Physical therapy evaluation  A. fib with RVR -Became tachycardic on 10/13 -Started on Cardizem infusion -Cardiology consulted -Amiodarone infusion added to her regimen -On 10/14, patient converted back to sinus rhythm -He is chronically on Toprol-XL that had not been continued on admission due to soft blood pressures -We will restart on metoprolol and wean off Cardizem -Holding anticoagulation for now -Check echocardiogram to evaluate EF   Status is:  Inpatient  Remains inpatient  appropriate because:IV treatments appropriate due to intensity of illness or inability to take PO   Dispo: The patient is from: Home              Anticipated d/c is to: Home              Anticipated d/c date is: 1 days              Patient currently is not medically stable to d/c.        Family Communication:  Significant other at bedside updated 10/13   Consultants:  none  Code Status:  FULL   DVT Prophylaxis:   [place] Lovenox   Procedures: As Listed in Progress Note Above  Antibiotics: vanco 10/11>> 10/12 Cefepime 10/11>> 10/12 Metronidazole 10/11>> Ceftriaxone 10/13 >    Subjective: Developed rapid atrial fibrillation overnight.  Required transfer to stepdown for Cardizem infusion.  At this time, he does not have any chest pain, palpitations.  Objective: Vitals:   12/08/19 1400 12/08/19 1500 12/08/19 1600 12/08/19 1636  BP: 114/68 (!) 92/52 130/81   Pulse: 76 74 84 79  Resp: (!) 21 (!) 24  17  Temp:    98 F (36.7 C)  TempSrc:    Oral  SpO2: 96% 97% 98% 97%  Weight:      Height:        Intake/Output Summary (Last 24 hours) at 12/08/2019 2007 Last data filed at 12/08/2019 1900 Gross per 24 hour  Intake 1130.22 ml  Output 800 ml  Net 330.22 ml   Weight change:  Exam:  General exam: Alert, awake, oriented x 3 Respiratory system: Crackles at bases. Respiratory effort normal. Cardiovascular system:RRR. No murmurs, rubs, gallops. Gastrointestinal system: Abdomen is nondistended, soft and nontender. No organomegaly or masses felt. Normal bowel sounds heard. Central nervous system: Alert and oriented. No focal neurological deficits. Extremities: 1+ edema bilaterally Skin: No rashes, lesions or ulcers  Psychiatry: Judgement and insight appear normal. Mood & affect appropriate.        Data Reviewed: I have personally reviewed following labs and imaging studies Basic Metabolic Panel: Recent Labs  Lab 12/02/19 1503 12/05/19 1202 12/06/19 0447  12/07/19 0613 12/08/19 0149  NA 129* 133* 134* 134* 134*  K 3.6 3.2* 4.1 3.7 3.3*  CL 99 102 106 107 108  CO2 20* 16* 19* 21* 16*  GLUCOSE 249* 205* 215* 280* 224*  BUN 8 17 19 21 20   CREATININE 0.62 1.29* 0.82 0.68 0.82  CALCIUM 8.5* 8.3* 7.5* 7.7* 8.2*  MG  --   --   --   --  1.7   Liver Function Tests: Recent Labs  Lab 12/02/19 1503 12/05/19 1202 12/06/19 0447 12/07/19 0613 12/08/19 0149  AST 29 34 47* 29 32  ALT 39 26 36 29 30  ALKPHOS 67 70 54 54 103  BILITOT 1.5* 1.8* 0.9 0.7 0.7  PROT 7.3 6.5 5.6* 5.6* 5.9*  ALBUMIN 3.8 3.0* 2.5* 2.5* 2.7*   Recent Labs  Lab 12/05/19 1202 12/07/19 0613  LIPASE 19 17   No results for input(s): AMMONIA in the last 168 hours. Coagulation Profile: No results for input(s): INR, PROTIME in the last 168 hours. CBC: Recent Labs  Lab 12/02/19 1500 12/05/19 1202 12/06/19 0447 12/07/19 0613  WBC 14.3* 32.0* 19.3* 10.8*  NEUTROABS  --  29.4*  --   --   HGB 13.0 11.5* 9.5* 9.0*  HCT 37.7* 33.5* 27.9* 27.0*  MCV 94.0 95.7  96.9 98.9  PLT 174 168 124* 91*   Cardiac Enzymes: No results for input(s): CKTOTAL, CKMB, CKMBINDEX, TROPONINI in the last 168 hours. BNP: Invalid input(s): POCBNP CBG: Recent Labs  Lab 12/07/19 1649 12/07/19 2050 12/08/19 0745 12/08/19 1100 12/08/19 1637  GLUCAP 199* 240* 239* 254* 262*   HbA1C: No results for input(s): HGBA1C in the last 72 hours. Urine analysis:    Component Value Date/Time   COLORURINE AMBER (A) 12/05/2019 1347   APPEARANCEUR CLOUDY (A) 12/05/2019 1347   LABSPEC 1.020 12/05/2019 1347   PHURINE 5.0 12/05/2019 1347   GLUCOSEU >=500 (A) 12/05/2019 1347   HGBUR NEGATIVE 12/05/2019 1347   BILIRUBINUR SMALL (A) 12/05/2019 1347   KETONESUR NEGATIVE 12/05/2019 1347   PROTEINUR 30 (A) 12/05/2019 1347   NITRITE NEGATIVE 12/05/2019 1347   LEUKOCYTESUR NEGATIVE 12/05/2019 1347   Sepsis Labs: @LABRCNTIP (procalcitonin:4,lacticidven:4) ) Recent Results (from the past 240 hour(s))   Respiratory Panel by RT PCR (Flu A&B, Covid) - Nasopharyngeal Swab     Status: None   Collection Time: 12/05/19 12:27 PM   Specimen: Nasopharyngeal Swab  Result Value Ref Range Status   SARS Coronavirus 2 by RT PCR NEGATIVE NEGATIVE Final    Comment: (NOTE) SARS-CoV-2 target nucleic acids are NOT DETECTED.  The SARS-CoV-2 RNA is generally detectable in upper respiratoy specimens during the acute phase of infection. The lowest concentration of SARS-CoV-2 viral copies this assay can detect is 131 copies/mL. A negative result does not preclude SARS-Cov-2 infection and should not be used as the sole basis for treatment or other patient management decisions. A negative result may occur with  improper specimen collection/handling, submission of specimen other than nasopharyngeal swab, presence of viral mutation(s) within the areas targeted by this assay, and inadequate number of viral copies (<131 copies/mL). A negative result must be combined with clinical observations, patient history, and epidemiological information. The expected result is Negative.  Fact Sheet for Patients:  PinkCheek.be  Fact Sheet for Healthcare Providers:  GravelBags.it  This test is no t yet approved or cleared by the [place] FDA and  has been authorized for detection and/or diagnosis of SARS-CoV-2 by FDA under an Emergency Use Authorization (EUA). This EUA will remain  in effect (meaning this test can be used) for the duration of the COVID-19 declaration under Section 564(b)(1) of the Act, 21 U.S.C. section 360bbb-3(b)(1), unless the authorization is terminated or revoked sooner.     Influenza A by PCR NEGATIVE NEGATIVE Final   Influenza B by PCR NEGATIVE NEGATIVE Final    Comment: (NOTE) The Xpert Xpress SARS-CoV-2/FLU/RSV assay is intended as an aid in  the diagnosis of influenza from Nasopharyngeal swab specimens and  should not be used as  a sole basis for treatment. Nasal washings and  aspirates are unacceptable for Xpert Xpress SARS-CoV-2/FLU/RSV  testing.  Fact Sheet for Patients: PinkCheek.be  Fact Sheet for Healthcare Providers: GravelBags.it  This test is not yet approved or cleared by the [place] FDA and  has been authorized for detection and/or diagnosis of SARS-CoV-2 by  FDA under an Emergency Use Authorization (EUA). This EUA will remain  in effect (meaning this test can be used) for the duration of the  Covid-19 declaration under Section 564(b)(1) of the Act, 21  U.S.C. section 360bbb-3(b)(1), unless the authorization is  terminated or revoked. Performed at [facility], [address] [place], [place] [address]   Blood culture (routine x 2)     Status: None (Preliminary result)   Collection  Time: 12/05/19 12:50 PM   Specimen: BLOOD  Result Value Ref Range Status   Specimen Description BLOOD LEFT ANTECUBITAL DRAWN BY RN  Final   Special Requests   Final    BOTTLES DRAWN AEROBIC AND ANAEROBIC Blood Culture adequate volume   Culture   Final    NO GROWTH 3 DAYS Performed at [facility], [address] [place], [place] [address]    Report Status PENDING  Incomplete  Blood culture (routine x 2)     Status: Abnormal   Collection Time: 12/05/19 12:53 PM   Specimen: BLOOD RIGHT FOREARM  Result Value Ref Range Status   Specimen Description   Final    BLOOD RIGHT FOREARM DRAWN BY RN Performed at [facility], [address] [place], [place] [address]    Special Requests   Final    BOTTLES DRAWN AEROBIC AND ANAEROBIC Blood Culture results may not be optimal due to an excessive volume of blood received in culture bottles Performed at [facility], [address] [place], [place] [address]    Culture  Setup Time   Final    GRAM NEGATIVE RODS CRITICAL RESULT CALLED TO, READ BACK BY AND VERIFIED WITH: SHANNON BROWN @1242  ON 10/12 BY TJ AEROBIC  BOTTLE ONLY Performed at [facility], [place] [address], [place], [place] [address]    Culture KLEBSIELLA OXYTOCA (A)  Final   Report Status 12/08/2019 FINAL  Final   Organism ID, Bacteria KLEBSIELLA OXYTOCA  Final      Susceptibility   Klebsiella oxytoca - MIC*    AMPICILLIN >=32 RESISTANT Resistant     CEFAZOLIN 16 SENSITIVE Sensitive     CEFEPIME <=0.12 SENSITIVE Sensitive     CEFTAZIDIME <=1 SENSITIVE Sensitive     CEFTRIAXONE <=0.25 SENSITIVE Sensitive     CIPROFLOXACIN 2 INTERMEDIATE Intermediate     GENTAMICIN <=1 SENSITIVE Sensitive     IMIPENEM <=0.25 SENSITIVE Sensitive     TRIMETH/SULFA <=20 SENSITIVE Sensitive     AMPICILLIN/SULBACTAM INTERMEDIATE Intermediate     PIP/TAZO 8 SENSITIVE Sensitive     * KLEBSIELLA OXYTOCA  Blood Culture ID Panel (Reflexed)     Status: Abnormal   Collection Time: 12/05/19 12:53 PM  Result Value Ref Range Status   Enterococcus faecalis NOT DETECTED NOT DETECTED Final   Enterococcus Faecium NOT DETECTED NOT DETECTED Final   Listeria monocytogenes NOT DETECTED NOT DETECTED Final   Staphylococcus species NOT DETECTED NOT DETECTED Final   Staphylococcus aureus (BCID) NOT DETECTED NOT DETECTED Final   Staphylococcus epidermidis NOT DETECTED NOT DETECTED Final   Staphylococcus lugdunensis NOT DETECTED NOT DETECTED Final   Streptococcus species NOT DETECTED NOT DETECTED Final   Streptococcus agalactiae NOT DETECTED NOT DETECTED Final   Streptococcus pneumoniae NOT DETECTED NOT DETECTED Final   Streptococcus pyogenes NOT DETECTED NOT DETECTED Final   A.calcoaceticus-baumannii NOT DETECTED NOT DETECTED Final   Bacteroides fragilis NOT DETECTED NOT DETECTED Final   Enterobacterales DETECTED (A) NOT DETECTED Final    Comment: Enterobacterales represent a large order of gram negative bacteria, not a single organism. CRITICAL RESULT CALLED TO, READ BACK BY AND VERIFIED WITH: T VOGLER RN 12/06/19 AT 1657 SK    Enterobacter cloacae complex NOT  DETECTED NOT DETECTED Final   Escherichia coli NOT DETECTED NOT DETECTED Final   Klebsiella aerogenes NOT DETECTED NOT DETECTED Final   Klebsiella oxytoca DETECTED (A) NOT DETECTED Final    Comment: CRITICAL RESULT CALLED TO, READ BACK BY AND VERIFIED WITH: T VOGLER  RN 12/06/19 AT 1810 SK    Klebsiella pneumoniae NOT DETECTED NOT DETECTED Final   Proteus species NOT DETECTED NOT DETECTED Final   Salmonella species NOT DETECTED NOT DETECTED Final   Serratia marcescens NOT DETECTED NOT DETECTED Final   Haemophilus influenzae NOT DETECTED NOT DETECTED Final   Neisseria meningitidis NOT DETECTED NOT DETECTED Final   Pseudomonas aeruginosa NOT DETECTED NOT DETECTED Final   Stenotrophomonas maltophilia NOT DETECTED NOT DETECTED Final   Candida albicans NOT DETECTED NOT DETECTED Final   Candida auris NOT DETECTED NOT DETECTED Final   Candida glabrata NOT DETECTED NOT DETECTED Final   Candida krusei NOT DETECTED NOT DETECTED Final   Candida parapsilosis NOT DETECTED NOT DETECTED Final   Candida tropicalis NOT DETECTED NOT DETECTED Final   Cryptococcus neoformans/gattii NOT DETECTED NOT DETECTED Final   CTX-M ESBL NOT DETECTED NOT DETECTED Final   Carbapenem resistance IMP NOT DETECTED NOT DETECTED Final   Carbapenem resistance KPC NOT DETECTED NOT DETECTED Final   Carbapenem resistance NDM NOT DETECTED NOT DETECTED Final   Carbapenem resist OXA 48 LIKE NOT DETECTED NOT DETECTED Final   Carbapenem resistance VIM NOT DETECTED NOT DETECTED Final    Comment: Performed at [facility], [address] [place], [place] [address]  Urine culture     Status: Abnormal   Collection Time: 12/05/19  1:48 PM   Specimen: Urine, Random  Result Value Ref Range Status   Specimen Description   Final    URINE, RANDOM Performed at [facility], [address], [place], [place] [address]    Special Requests   Final    NONE Performed at [facility], [address], [place], [place] [address]     Culture (A)  Final    <10,000 COLONIES/mL INSIGNIFICANT GROWTH Performed at [facility], [place] [address], [place], [place] [address]    Report Status 12/06/2019 FINAL  Final  MRSA PCR Screening     Status: None   Collection Time: 12/05/19  3:16 PM   Specimen: Nasal Mucosa; Nasopharyngeal  Result Value Ref Range Status   MRSA by PCR NEGATIVE NEGATIVE Final    Comment:        The GeneXpert MRSA Assay (FDA approved for NASAL specimens only), is one component of a comprehensive MRSA colonization surveillance program. It is not intended to diagnose MRSA infection nor to guide or monitor treatment for MRSA infections. Performed at [facility], [address], [place], [place] [address]      Scheduled Meds: . aspirin EC  81 mg Oral q morning - 10a  . Chlorhexidine Gluconate Cloth  6 each Topical Daily  . enoxaparin (LOVENOX) injection  1 mg/kg Subcutaneous Q12H  . insulin aspart  0-15 Units Subcutaneous TID WC  . insulin detemir  10 Units Subcutaneous Daily  . metoprolol tartrate  25 mg Oral Q6H   Continuous Infusions: . amiodarone 30 mg/hr (12/08/19 1653)  . cefTRIAXone (ROCEPHIN)  IV Stopped (12/08/19 0835)  . diltiazem (CARDIZEM) infusion 10 mg/hr (12/08/19 1350)  . metronidazole 100 mL/hr at 12/08/19 1350    Procedures/Studies: DG Chest 2 View  Result Date: 12/05/2019 CLINICAL DATA:  Fever, weakness. EXAM: CHEST - 2 VIEW COMPARISON:  December 02, 2019. FINDINGS: Stable cardiomegaly. No pneumothorax or pleural effusion is noted. Both lungs are clear. The visualized skeletal structures are unremarkable. IMPRESSION: No active cardiopulmonary disease. Electronically Signed   By: Marijo Conception M.D.   On: 12/05/2019 13:24   DG Chest  2 View  Result Date: 12/02/2019 CLINICAL DATA:  79 year old male with chest pain. EXAM: CHEST - 2 VIEW COMPARISON:  Chest radiograph dated 06/19/2017. FINDINGS: Faint bilateral peripheral and subpleural interstitial densities may be chronic or  represent atypical infection. Clinical correlation is recommended. No lobar consolidation, pleural effusion, pneumothorax. Stable cardiac silhouette. No acute osseous pathology. IMPRESSION: Chronic changes versus atypical infection. No lobar consolidation. Electronically Signed   By: Anner Crete M.D.   On: 12/02/2019 15:37   CT ANGIO CHEST PE W OR WO CONTRAST  Addendum Date: 12/06/2019   ADDENDUM REPORT: 12/06/2019 14:02 ADDENDUM: In addition of findings outlined previously there is a nodule in the RIGHT mid chest that may represent a perifissural lymph node though based on size and in the acute setting. (This measures 8 mm) Non-contrast chest CT at 6-12 months is recommended. If the nodule is stable at time of repeat CT, then future CT at 18-24 months (from today's scan) is considered optional for low-risk patients, but is recommended for high-risk patients. This recommendation follows the consensus statement: Guidelines for Management of Incidental Pulmonary Nodules Detected on CT Images: From the Fleischner Society 2017; Radiology 2017; 284:228-243. These results will be called to the ordering clinician or representative by the Radiologist Assistant, and communication documented in the PACS or [employer]. Electronically Signed   By: Zetta Bills M.D.   On: 12/06/2019 14:02   Result Date: 12/06/2019 CLINICAL DATA:  Acute abdominal pain and shortness of breath. EXAM: CT ANGIOGRAPHY CHEST CT ABDOMEN AND PELVIS WITH CONTRAST TECHNIQUE: Multidetector CT imaging of the chest was performed using the standard protocol during bolus administration of intravenous contrast. Multiplanar CT image reconstructions and MIPs were obtained to evaluate the vascular anatomy. Multidetector CT imaging of the abdomen and pelvis was performed using the standard protocol during bolus administration of intravenous contrast. CONTRAST:  166mL OMNIPAQUE IOHEXOL 350 MG/ML SOLN COMPARISON:  Recent CT evaluation from  12/02/2019 FINDINGS: CTA CHEST FINDINGS Cardiovascular: Scattered atherosclerosis without signs of aneurysm of the thoracic aorta. Heart size mildly enlarged without pericardial effusion. Three-vessel coronary artery disease. 3.2 cm main pulmonary artery. No central or lobar level pulmonary embolism. Segmental branches with limited assessment in particular in the lung bases due to respiratory motion and bolus timing. Mediastinum/Nodes: Esophagus is normal. Thoracic inlet structures are unremarkable. No axillary lymphadenopathy no mediastinal or hilar lymphadenopathy Lungs/Pleura: Mild septal thickening, signs of some subpleural reticulation. Evidence of basilar atelectasis. No lobar consolidation. No pleural effusion. Airways are patent. Peri fissural nodule measures 8 x 7 mm (image 75 of series 4) Musculoskeletal: No chest wall mass. No acute musculoskeletal process. No destructive bone finding spinal degenerative changes. Review of the MIP images confirms the above findings. CT ABDOMEN and PELVIS FINDINGS Hepatobiliary: Wall enhancement and pericholecystic stranding. Segmental enhancement and or increased density along the common bile duct. No intrahepatic biliary duct dilation. Pancreas: Pancreas with peripheral atrophy.  No ductal dilation. Spleen: Spleen normal in size and contour. Adrenals/Urinary Tract: Adrenal glands are normal. Symmetric renal enhancement.  No hydronephrosis. Stomach/Bowel: Small hiatal hernia. No acute small bowel process. Signs of diverticular disease. Stranding about the hepatic flexure of the colon is likely secondary to acute cholecystitis Vascular/Lymphatic: Calcified and noncalcified atheromatous plaque in the thoracic aorta extends into the abdominal aorta. No sign of aneurysm. There is no gastrohepatic or hepatoduodenal ligament lymphadenopathy. No retroperitoneal or mesenteric lymphadenopathy. No pelvic sidewall lymphadenopathy. Reproductive: Heterogeneous prostate similar to the  prior exam. Other: Small to moderate fat containing bilateral inguinal  hernias and small fat containing umbilical hernia. Musculoskeletal: No acute musculoskeletal process. No destructive bone finding. Spinal degenerative changes. Review of the MIP images confirms the above findings. IMPRESSION: 1. No signs of central or lobar level pulmonary embolism. Segmental branches with limited assessment in particular in the lung bases due to respiratory motion and bolus timing. 2. Mild septal thickening, signs of some subpleural reticulation, may represent mild interstitial lung disease. 3. CT findings of acute cholecystitis with interval increase in pericholecystic stranding when compared to prior imaging. HIDA scan could be helpful for added specificity in the setting of acute cholecystitis if there is some doubt as to diagnosis based on clinical picture. 4. Segmental enhancement and or increased density along the common bile duct may represent choledocholithiasis or cholangitis as it is slightly longer segment than expected for biliary calculi. No intrahepatic biliary duct dilation. Follow-up CT with contrast or MRI/MRCP may be helpful to exclude underlying lesion. 5. Signs of diverticular disease. 6. Small hiatal hernia. 7. Small to moderate fat containing bilateral inguinal hernias and small fat containing umbilical hernia. 8. Aortic atherosclerosis. Aortic Atherosclerosis (ICD10-I70.0). Electronically Signed: By: Zetta Bills M.D. On: 12/06/2019 13:39   CT ABDOMEN PELVIS W CONTRAST  Addendum Date: 12/06/2019   ADDENDUM REPORT: 12/06/2019 14:02 ADDENDUM: In addition of findings outlined previously there is a nodule in the RIGHT mid chest that may represent a perifissural lymph node though based on size and in the acute setting. (This measures 8 mm) Non-contrast chest CT at 6-12 months is recommended. If the nodule is stable at time of repeat CT, then future CT at 18-24 months (from today's scan) is considered  optional for low-risk patients, but is recommended for high-risk patients. This recommendation follows the consensus statement: Guidelines for Management of Incidental Pulmonary Nodules Detected on CT Images: From the Fleischner Society 2017; Radiology 2017; 284:228-243. These results will be called to the ordering clinician or representative by the Radiologist Assistant, and communication documented in the PACS or [employer]. Electronically Signed   By: Zetta Bills M.D.   On: 12/06/2019 14:02   Result Date: 12/06/2019 CLINICAL DATA:  Acute abdominal pain and shortness of breath. EXAM: CT ANGIOGRAPHY CHEST CT ABDOMEN AND PELVIS WITH CONTRAST TECHNIQUE: Multidetector CT imaging of the chest was performed using the standard protocol during bolus administration of intravenous contrast. Multiplanar CT image reconstructions and MIPs were obtained to evaluate the vascular anatomy. Multidetector CT imaging of the abdomen and pelvis was performed using the standard protocol during bolus administration of intravenous contrast. CONTRAST:  135mL OMNIPAQUE IOHEXOL 350 MG/ML SOLN COMPARISON:  Recent CT evaluation from 12/02/2019 FINDINGS: CTA CHEST FINDINGS Cardiovascular: Scattered atherosclerosis without signs of aneurysm of the thoracic aorta. Heart size mildly enlarged without pericardial effusion. Three-vessel coronary artery disease. 3.2 cm main pulmonary artery. No central or lobar level pulmonary embolism. Segmental branches with limited assessment in particular in the lung bases due to respiratory motion and bolus timing. Mediastinum/Nodes: Esophagus is normal. Thoracic inlet structures are unremarkable. No axillary lymphadenopathy no mediastinal or hilar lymphadenopathy Lungs/Pleura: Mild septal thickening, signs of some subpleural reticulation. Evidence of basilar atelectasis. No lobar consolidation. No pleural effusion. Airways are patent. Peri fissural nodule measures 8 x 7 mm (image 75 of series 4)  Musculoskeletal: No chest wall mass. No acute musculoskeletal process. No destructive bone finding spinal degenerative changes. Review of the MIP images confirms the above findings. CT ABDOMEN and PELVIS FINDINGS Hepatobiliary: Wall enhancement and pericholecystic stranding. Segmental enhancement and or increased density  along the common bile duct. No intrahepatic biliary duct dilation. Pancreas: Pancreas with peripheral atrophy.  No ductal dilation. Spleen: Spleen normal in size and contour. Adrenals/Urinary Tract: Adrenal glands are normal. Symmetric renal enhancement.  No hydronephrosis. Stomach/Bowel: Small hiatal hernia. No acute small bowel process. Signs of diverticular disease. Stranding about the hepatic flexure of the colon is likely secondary to acute cholecystitis Vascular/Lymphatic: Calcified and noncalcified atheromatous plaque in the thoracic aorta extends into the abdominal aorta. No sign of aneurysm. There is no gastrohepatic or hepatoduodenal ligament lymphadenopathy. No retroperitoneal or mesenteric lymphadenopathy. No pelvic sidewall lymphadenopathy. Reproductive: Heterogeneous prostate similar to the prior exam. Other: Small to moderate fat containing bilateral inguinal hernias and small fat containing umbilical hernia. Musculoskeletal: No acute musculoskeletal process. No destructive bone finding. Spinal degenerative changes. Review of the MIP images confirms the above findings. IMPRESSION: 1. No signs of central or lobar level pulmonary embolism. Segmental branches with limited assessment in particular in the lung bases due to respiratory motion and bolus timing. 2. Mild septal thickening, signs of some subpleural reticulation, may represent mild interstitial lung disease. 3. CT findings of acute cholecystitis with interval increase in pericholecystic stranding when compared to prior imaging. HIDA scan could be helpful for added specificity in the setting of acute cholecystitis if there is  some doubt as to diagnosis based on clinical picture. 4. Segmental enhancement and or increased density along the common bile duct may represent choledocholithiasis or cholangitis as it is slightly longer segment than expected for biliary calculi. No intrahepatic biliary duct dilation. Follow-up CT with contrast or MRI/MRCP may be helpful to exclude underlying lesion. 5. Signs of diverticular disease. 6. Small hiatal hernia. 7. Small to moderate fat containing bilateral inguinal hernias and small fat containing umbilical hernia. 8. Aortic atherosclerosis. Aortic Atherosclerosis (ICD10-I70.0). Electronically Signed: By: Zetta Bills M.D. On: 12/06/2019 13:39   CT Abdomen Pelvis W Contrast  Result Date: 12/02/2019 CLINICAL DATA:  Epigastric and left lower quadrant pain with chest pain onset last night EXAM: CT ABDOMEN AND PELVIS WITH CONTRAST TECHNIQUE: Multidetector CT imaging of the abdomen and pelvis was performed using the standard protocol following bolus administration of intravenous contrast. CONTRAST:  116mL OMNIPAQUE IOHEXOL 300 MG/ML  SOLN COMPARISON:  CT abdomen pelvis 10/09/2009 (report only, CT abdomen pelvis 01/24/2004 FINDINGS: Lower chest: Atelectatic changes are present in the lung bases. Additional subpleural reticular features are noted, incompletely assessed on this exam. No consolidation or pleural effusion. Normal heart size. No pericardial effusion. Three-vessel coronary artery calcifications. Calcifications of the aortic leaflets and mitral annulus as well. Hepatobiliary: Diffuse hepatic hypoattenuation compatible with hepatic steatosis. Sparing along the gallbladder fossa. No focal worrisome liver lesion. Mild pericholecystic inflammatory change particularly towards the neck of the gallbladder with moderate gallbladder distension. No visible calcified gallstone is seen. No biliary ductal dilatation or visible intraductal gallstones. Pancreas: Unremarkable. No pancreatic ductal dilatation  or surrounding inflammatory changes. Spleen: Normal in size. No concerning splenic lesions. Adrenals/Urinary Tract: Normal adrenal glands. Kidneys are normally located with symmetric enhancement and excretion. Few scattered subcentimeter hypoattenuating foci in both kidneys too small to fully characterize on CT imaging but statistically likely benign. Nonobstructing calculus seen in the lower pole left kidney measuring up to 3 mm in size. No suspicious renal lesion, obstructive urolithiasis or hydronephrosis. Urinary bladder is largely decompressed at the time of exam and therefore poorly evaluated by CT imaging. No acute bladder abnormality. Mild indentation of the bladder base by the prostate. Stomach/Bowel: Small sliding-type hiatal hernia.  Distal stomach and duodenum are unremarkable. No small bowel thickening or dilatation. Thickening towards the hepatic flexure is favored to be reactive to the process in the gallbladder fossa. Distal colonic diverticulosis is noted. Some mild segmental thickening of the rectosigmoid is noted albeit not focally centered upon any particular culprit diverticulum. Minimal pericolonic haze. Could reflect a mild colitis versus sequela of prior inflammation. No evidence of obstruction. Appendix is not visualized. No focal inflammation the vicinity of the cecum to suggest an occult appendicitis. Vascular/Lymphatic: Atherosclerotic calcifications within the abdominal aorta and branch vessels. No aneurysm or ectasia. No enlarged abdominopelvic lymph nodes. Reproductive: Few calcifications within a borderline enlarged prostate. Seminal vesicles are unremarkable. Other: Inflammatory changes predominantly along the gallbladder fossa and inferior tip of the liver as well as minimally adjacent the rectosigmoid though this may be redistributed inflammatory change. No free air or fluid. No organized collection or abscess. Multilevel degenerative changes are present in the imaged portions of  the spine. No acute osseous abnormality or suspicious osseous lesion. Musculoskeletal: Multilevel degenerative changes are present in the imaged portions of the spine. Minimal retrolisthesis L1 on L2 without pars defects. Possible subacute to chronic subcortical impaction fracture of anterosuperior endplate L3 with up to 24% height loss. No other acute or suspicious osseous abnormalities. Additional degenerative changes in the hips and pelvis. IMPRESSION: 1. Mild pericholecystic inflammatory change particularly towards the neck of the gallbladder with moderate gallbladder distension. No visible calcified gallstone is seen. Findings are concerning for acute cholecystitis. Recommend further evaluation with right upper quadrant ultrasound. 2. Some mild segmental thickening of the rectosigmoid is noted albeit not focally centered upon any particular culprit diverticulum. Minimal pericolonic haze. Could reflect a mild colitis versus sequela of prior inflammation. Correlate with clinical symptoms and consider outpatient evaluation with colonoscopy if not recently performed. 3. Nonobstructing left nephrolithiasis. 4. Likely subacute to chronic subcortical impaction fracture of anterosuperior endplate L3 with up to 40% height loss. Correlate for point tenderness to exclude acuity. 5. Hepatic steatosis. 6. Aortic Atherosclerosis (ICD10-I70.0). Electronically Signed   By: Lovena Le M.D.   On: 12/02/2019 18:17   MR 3D Recon At Scanner  Result Date: 12/06/2019 CLINICAL DATA:  Right upper quadrant abdominal pain EXAM: MRI ABDOMEN WITHOUT AND WITH CONTRAST (INCLUDING MRCP) TECHNIQUE: Multiplanar multisequence MR imaging of the abdomen was performed both before and after the administration of intravenous contrast. Heavily T2-weighted images of the biliary and pancreatic ducts were obtained, and three-dimensional MRCP images were rendered by post processing. CONTRAST:  65mL GADAVIST GADOBUTROL 1 MMOL/ML IV SOLN COMPARISON:   CT abdomen 12/06/2019 FINDINGS: Despite efforts by the technologist and patient, motion artifact is present on today's exam and could not be eliminated. This reduces exam sensitivity and specificity. Lower chest: Mildly accentuated interstitium. Mild cardiomegaly. No significant pleural effusion. Hepatobiliary: Gallbladder wall thickening is present along with edema in the adipose tissues adjacent to the gallbladder favoring gallbladder inflammation. No gallstones are identified. No gas is identified in the gallbladder wall. On post-contrast images, there is only mildly accentuated enhancement in the gallbladder wall. No gallbladder mass is evident. 0.6 cm cyst in segment 4 of the liver on image 13 of series 12. No biliary dilatation is observed. No significant abnormal hepatic enhancement. Pancreas:  Unremarkable Spleen:  Unremarkable Adrenals/Urinary Tract: 1.0 by 0.6 cm right adrenal myelolipoma. Mild scarring of the right mid kidney posteriorly. Stomach/Bowel: Unremarkable Vascular/Lymphatic: Aortoiliac atherosclerotic vascular disease. No pathologic adenopathy. Other:  No supplemental non-categorized findings. Musculoskeletal: Thoracic and lumbar spondylosis  and degenerative disc disease. IMPRESSION: 1. Gallbladder wall thickening and edema in the adipose tissues adjacent to the gallbladder favoring gallbladder inflammation/acalculous cholecystitis. No gallstones are identified. 2. No biliary dilatation or choledocholithiasis is identified. 3. Small right adrenal myelolipoma. 4. Mild scarring of the right mid kidney posteriorly. 5. Mild cardiomegaly. 6. Thoracic and lumbar spondylosis and degenerative disc disease. Electronically Signed   By: Van Clines M.D.   On: 12/06/2019 16:23   US Abdomen Limited  Result Date: 12/05/2019 CLINICAL DATA:  Right upper quadrant abdominal pain. EXAM: ULTRASOUND ABDOMEN LIMITED RIGHT UPPER QUADRANT COMPARISON:  December 02, 2019. FINDINGS: Gallbladder: No gallstones  or wall thickening visualized. No sonographic Murphy sign noted by sonographer. Common bile duct: Diameter: 2 mm which is within normal limits. Liver: Increased echogenicity of hepatic parenchyma is noted suggesting hepatic steatosis with probable fatty sparing adjacent to gallbladder fossa. Portal vein is patent on color Doppler imaging with normal direction of blood flow towards the liver. Other: None. IMPRESSION: Increased echogenicity of hepatic parenchyma is noted with probable fatty sparing adjacent to gallbladder fossa. Electronically Signed   By: Marijo Conception M.D.   On: 12/05/2019 13:49   DG CHEST PORT 1 VIEW  Result Date: 12/08/2019 CLINICAL DATA:  Shortness of breath EXAM: PORTABLE CHEST 1 VIEW COMPARISON:  12/05/2019 FINDINGS: Bilateral interstitial opacities, likely pulmonary edema. No pleural effusion or pneumothorax. Normal cardiomediastinal contours. IMPRESSION: Moderate interstitial pulmonary edema. Electronically Signed   By: Ulyses Jarred M.D.   On: 12/08/2019 01:03   MR ABDOMEN MRCP W WO CONTAST  Result Date: 12/06/2019 CLINICAL DATA:  Right upper quadrant abdominal pain EXAM: MRI ABDOMEN WITHOUT AND WITH CONTRAST (INCLUDING MRCP) TECHNIQUE: Multiplanar multisequence MR imaging of the abdomen was performed both before and after the administration of intravenous contrast. Heavily T2-weighted images of the biliary and pancreatic ducts were obtained, and three-dimensional MRCP images were rendered by post processing. CONTRAST:  79mL GADAVIST GADOBUTROL 1 MMOL/ML IV SOLN COMPARISON:  CT abdomen 12/06/2019 FINDINGS: Despite efforts by the technologist and patient, motion artifact is present on today's exam and could not be eliminated. This reduces exam sensitivity and specificity. Lower chest: Mildly accentuated interstitium. Mild cardiomegaly. No significant pleural effusion. Hepatobiliary: Gallbladder wall thickening is present along with edema in the adipose tissues adjacent to the  gallbladder favoring gallbladder inflammation. No gallstones are identified. No gas is identified in the gallbladder wall. On post-contrast images, there is only mildly accentuated enhancement in the gallbladder wall. No gallbladder mass is evident. 0.6 cm cyst in segment 4 of the liver on image 13 of series 12. No biliary dilatation is observed. No significant abnormal hepatic enhancement. Pancreas:  Unremarkable Spleen:  Unremarkable Adrenals/Urinary Tract: 1.0 by 0.6 cm right adrenal myelolipoma. Mild scarring of the right mid kidney posteriorly. Stomach/Bowel: Unremarkable Vascular/Lymphatic: Aortoiliac atherosclerotic vascular disease. No pathologic adenopathy. Other:  No supplemental non-categorized findings. Musculoskeletal: Thoracic and lumbar spondylosis and degenerative disc disease. IMPRESSION: 1. Gallbladder wall thickening and edema in the adipose tissues adjacent to the gallbladder favoring gallbladder inflammation/acalculous cholecystitis. No gallstones are identified. 2. No biliary dilatation or choledocholithiasis is identified. 3. Small right adrenal myelolipoma. 4. Mild scarring of the right mid kidney posteriorly. 5. Mild cardiomegaly. 6. Thoracic and lumbar spondylosis and degenerative disc disease. Electronically Signed   By: Van Clines M.D.   On: 12/06/2019 16:23    Kathie Dike, MD  [facility]  If 7PM-7AM, please contact night-coverage [email or web address]  12/08/2019, 8:07 PM   LOS: 3 days

## 2019-12-09 ENCOUNTER — Inpatient Hospital Stay (HOSPITAL_COMMUNITY): Payer: Medicare HMO

## 2019-12-09 DIAGNOSIS — A4159 Other Gram-negative sepsis: Principal | ICD-10-CM

## 2019-12-09 DIAGNOSIS — I4891 Unspecified atrial fibrillation: Secondary | ICD-10-CM

## 2019-12-09 DIAGNOSIS — I340 Nonrheumatic mitral (valve) insufficiency: Secondary | ICD-10-CM

## 2019-12-09 DIAGNOSIS — I361 Nonrheumatic tricuspid (valve) insufficiency: Secondary | ICD-10-CM

## 2019-12-09 LAB — CBC
HCT: 29.3 % — ABNORMAL LOW (ref 39.0–52.0)
Hemoglobin: 10.1 g/dL — ABNORMAL LOW (ref 13.0–17.0)
MCH: 32.6 pg (ref 26.0–34.0)
MCHC: 34.5 g/dL (ref 30.0–36.0)
MCV: 94.5 fL (ref 80.0–100.0)
Platelets: 116 10*3/uL — ABNORMAL LOW (ref 150–400)
RBC: 3.1 MIL/uL — ABNORMAL LOW (ref 4.22–5.81)
RDW: 19.9 % — ABNORMAL HIGH (ref 11.5–15.5)
WBC: 7.5 10*3/uL (ref 4.0–10.5)
nRBC: 0 % (ref 0.0–0.2)

## 2019-12-09 LAB — ECHOCARDIOGRAM COMPLETE
AR max vel: 0.79 cm2
AV Area VTI: 0.72 cm2
AV Area mean vel: 0.77 cm2
AV Mean grad: 7 mmHg
AV Peak grad: 10.9 mmHg
Ao pk vel: 1.65 m/s
Area-P 1/2: 4.1 cm2
Height: 67 in
MV M vel: 5.5 m/s
MV Peak grad: 121 mmHg
Radius: 0.4 cm
S' Lateral: 3.12 cm
Weight: 3245.17 oz

## 2019-12-09 LAB — GLUCOSE, CAPILLARY
Glucose-Capillary: 149 mg/dL — ABNORMAL HIGH (ref 70–99)
Glucose-Capillary: 178 mg/dL — ABNORMAL HIGH (ref 70–99)
Glucose-Capillary: 132 mg/dL — ABNORMAL HIGH (ref 70–99)

## 2019-12-09 LAB — BASIC METABOLIC PANEL
Anion gap: 9 (ref 5–15)
BUN: 17 mg/dL (ref 8–23)
CO2: 19 mmol/L — ABNORMAL LOW (ref 22–32)
Calcium: 8.1 mg/dL — ABNORMAL LOW (ref 8.9–10.3)
Chloride: 106 mmol/L (ref 98–111)
Creatinine, Ser: 0.65 mg/dL (ref 0.61–1.24)
GFR, Estimated: >60 mL/min (ref >60)
Glucose, Bld: 189 mg/dL — ABNORMAL HIGH (ref 70–99)
Potassium: 3.6 mmol/L (ref 3.5–5.1)
Sodium: 134 mmol/L — ABNORMAL LOW (ref 135–145)

## 2019-12-09 MED ORDER — APIXABAN 5 MG PO TABS: 5.0000 mg | ORAL_TABLET | Freq: Two times a day (BID) | ORAL | 0 refills | Status: DC

## 2019-12-09 MED ORDER — SULFAMETHOXAZOLE-TRIMETHOPRIM 800-160 MG PO TABS: 1.0000 | ORAL_TABLET | Freq: Two times a day (BID) | ORAL | 0 refills | Status: DC

## 2019-12-09 MED ORDER — AMIODARONE HCL 200 MG PO TABS
400.0000 mg | ORAL_TABLET | Freq: Two times a day (BID) | ORAL | 0 refills | Status: DC
Start: 2019-12-09 — End: 2019-12-19

## 2019-12-09 MED ORDER — AMIODARONE HCL 200 MG PO TABS
400.0000 mg | ORAL_TABLET | Freq: Two times a day (BID) | ORAL | Status: DC
  Administered 2019-12-09: 400 mg via ORAL
  Filled 2019-12-09: qty 2

## 2019-12-09 MED ORDER — APIXABAN 5 MG PO TABS: 5.0000 mg | ORAL_TABLET | Freq: Two times a day (BID) | ORAL | Status: DC

## 2019-12-09 MED ORDER — SULFAMETHOXAZOLE-TRIMETHOPRIM 800-160 MG PO TABS: 1.0000 | ORAL_TABLET | Freq: Two times a day (BID) | ORAL | Status: DC

## 2019-12-09 NOTE — Care Management Important Message (Signed)
Important Message  Patient Details  Name: Andrew Humphrey MRN: [id number] Date of Birth: [birth year]   Medicare Important Message Given:  Yes     Tommy Medal 12/09/2019, 3:32 PM

## 2019-12-09 NOTE — Progress Notes (Signed)
Progress Note  Patient Name: Andrew Humphrey Date of Encounter: 12/09/2019  [facility] Cardiologist:   New  Subjective   Pt says his breathing is OK  He deneis CP     Inpatient Medications    Scheduled Meds: . amiodarone  400 mg Oral BID  . aspirin EC  81 mg Oral q morning - 10a  . Chlorhexidine Gluconate Cloth  6 each Topical Daily  . enoxaparin (LOVENOX) injection  1 mg/kg Subcutaneous Q12H  . insulin aspart  0-15 Units Subcutaneous TID WC  . insulin detemir  10 Units Subcutaneous Daily  . metoprolol tartrate  25 mg Oral Q6H  . [START ON 12/10/2019] sulfamethoxazole-trimethoprim  1 tablet Oral Q12H   Continuous Infusions: . diltiazem (CARDIZEM) infusion Stopped (12/08/19 1917)   PRN Meds: acetaminophen **OR** acetaminophen, ipratropium-albuterol, loperamide, ondansetron **OR** ondansetron (ZOFRAN) IV   Vital Signs    Vitals:   12/09/19 0700 12/09/19 0800 12/09/19 0815 12/09/19 0900  BP: 131/85 (!) 145/96  120/82  Pulse: 66 68  71  Resp: (!) 23 18  (!) 23  Temp:   97.9 F (36.6 C)   TempSrc:      SpO2: 95% 98%  94%  Weight:      Height:        Intake/Output Summary (Last 24 hours) at 12/09/2019 1103 Last data filed at 12/09/2019 1007 Gross per 24 hour  Intake 1090.71 ml  Output 1100 ml  Net -9.29 ml   Last 3 Weights 12/09/2019 12/08/2019 12/05/2019  Weight (lbs) 202 lb 13.2 oz 204 lb 5.9 oz 198 lb 13.7 oz  Weight (kg) 92 kg 92.7 kg 90.2 kg      Telemetry    SR  - Personally Reviewed  ECG    None new  - Personally Reviewed  Physical Exam   GEN: No acute distress.   Neck: No JVD Cardiac: RRR, no murmurs Respiratory: Mild rhonchi  GI: Soft, nontender, non-distended  MS: No edema; No deformity. Neuro:  Nonfocal  Psych: Normal affect   Labs    High Sensitivity Troponin:   Recent Labs  Lab 12/02/19 1500 12/02/19 1801 12/05/19 1231 12/08/19 0149 12/08/19 0451  TROPONINIHS 4 4 6  123* 100*      Chemistry Recent Labs  Lab  12/06/19 0447 12/06/19 0447 12/07/19 0613 12/08/19 0149 12/09/19 0336  NA 134*   < > 134* 134* 134*  K 4.1   < > 3.7 3.3* 3.6  CL 106   < > 107 108 106  CO2 19*   < > 21* 16* 19*  GLUCOSE 215*   < > 280* 224* 189*  BUN 19   < > 21 20 17   CREATININE 0.82   < > 0.68 0.82 0.65  CALCIUM 7.5*   < > 7.7* 8.2* 8.1*  PROT 5.6*  --  5.6* 5.9*  --   ALBUMIN 2.5*  --  2.5* 2.7*  --   AST 47*  --  29 32  --   ALT 36  --  29 30  --   ALKPHOS 54  --  54 103  --   BILITOT 0.9  --  0.7 0.7  --   GFRNONAA >60   < > >60 >60 >60  ANIONGAP 9   < > 6 10 9    < > = values in this interval not displayed.     Hematology Recent Labs  Lab 12/06/19 0447 12/07/19 0613 12/09/19 0336  WBC 19.3* 10.8* 7.5  RBC 2.88* 2.73* 3.10*  HGB 9.5* 9.0* 10.1*  HCT 27.9* 27.0* 29.3*  MCV 96.9 98.9 94.5  MCH 33.0 33.0 32.6  MCHC 34.1 33.3 34.5  RDW 19.2* 20.0* 19.9*  PLT 124* 91* 116*    BNPNo results for input(s): BNP, PROBNP in the last 168 hours.   DDimer  Recent Labs  Lab 12/06/19 1038  DDIMER 9.70*     Radiology    DG CHEST PORT 1 VIEW  Result Date: 12/08/2019 CLINICAL DATA:  Shortness of breath EXAM: PORTABLE CHEST 1 VIEW COMPARISON:  12/05/2019 FINDINGS: Bilateral interstitial opacities, likely pulmonary edema. No pleural effusion or pneumothorax. Normal cardiomediastinal contours. IMPRESSION: Moderate interstitial pulmonary edema. Electronically Signed   By: Ulyses Jarred M.D.   On: 12/08/2019 01:03    Cardiac Studies     Patient Profile      Andrew Humphrey is a 79 y.o. male with a history of hypertension, type 2 diabetes mellitus, probable paroxysmal atrial fibrillation, and previous GI bleed who is being seen today for the evaluation of atrial fibrillation with RVR at the request of Dr. Roderic Palau.  Assessment & Plan    1  Afib with RVR  Pt is back in SR after  amiodarone   Would continue for now   And send home on amiodarone 200 bid  He had afib a few years ago Converted after  diltiazem  Started on Eliquis  ? Stopped   Was on on admit WOuld sent home on Eliquis 5 bid and no aspirin   Will need close outpt f/u   If Hgb drops stop eliquis   Keep on amiodarone to maintain SR  Taper as outpt  2  Elev trop  Very minor  Peak 123  Most likely demand in setting of rapid rates   3  GI  No acute bleeding   4.  Heme   Anemia will need to be followed as outpt.  OK for d/c   Will make sure has f/u  For questions or updates, please contact [place] Please consult [email or web address] for contact info under        Signed, Dorris Carnes, MD  12/09/2019, 11:03 AM

## 2019-12-09 NOTE — Progress Notes (Signed)
°  Echocardiogram 2D Echocardiogram has been performed.  Shaylyn Bawa G Takirah Binford 12/09/2019, 3:14 PM

## 2019-12-09 NOTE — Progress Notes (Signed)
Subjective: Patient denies abdominal pain.  Wants to go home.  Objective: Vital signs in last 24 hours: Temp:  [97.9 F (36.6 C)-98.7 F (37.1 C)] 97.9 F (36.6 C) (10/15 0815) Pulse Rate:  [62-118] 65 (10/15 1100) Resp:  [15-28] 18 (10/15 1100) BP: (90-145)/(52-97) 133/87 (10/15 1100) SpO2:  [92 %-99 %] 95 % (10/15 1100) Weight:  [92 kg] 92 kg (10/15 0500) Last BM Date: 12/08/19  Intake/Output from previous day: 10/14 0701 - 10/15 0700 In: 464.5 [I.V.:239.6; IV Piggyback:224.8] Out: 1100 [Urine:1100] Intake/Output this shift: Total I/O In: 785.3 [I.V.:413.6; IV Piggyback:371.7] Out: -   General appearance: alert, cooperative and no distress GI: soft, non-tender; bowel sounds normal; no masses,  no organomegaly  Lab Results:  Recent Labs    12/07/19 0613 12/09/19 0336  WBC 10.8* 7.5  HGB 9.0* 10.1*  HCT 27.0* 29.3*  PLT 91* 116*   BMET Recent Labs    12/08/19 0149 12/09/19 0336  NA 134* 134*  K 3.3* 3.6  CL 108 106  CO2 16* 19*  GLUCOSE 224* 189*  BUN 20 17  CREATININE 0.82 0.65  CALCIUM 8.2* 8.1*   PT/INR No results for input(s): LABPROT, INR in the last 72 hours.  Studies/Results: DG CHEST PORT 1 VIEW  Result Date: 12/08/2019 CLINICAL DATA:  Shortness of breath EXAM: PORTABLE CHEST 1 VIEW COMPARISON:  12/05/2019 FINDINGS: Bilateral interstitial opacities, likely pulmonary edema. No pleural effusion or pneumothorax. Normal cardiomediastinal contours. IMPRESSION: Moderate interstitial pulmonary edema. Electronically Signed   By: Ulyses Jarred M.D.   On: 12/08/2019 01:03    Anti-infectives: Anti-infectives (From admission, onward)   Start     Dose/Rate Route Frequency Ordered Stop   12/10/19 1000  sulfamethoxazole-trimethoprim (BACTRIM DS) 800-160 MG per tablet 1 tablet        1 tablet Oral Every 12 hours 12/09/19 1000     12/07/19 1400  metroNIDAZOLE (FLAGYL) IVPB 500 mg  Status:  Discontinued        500 mg 100 mL/hr over 60 Minutes Intravenous  Every 8 hours 12/07/19 1047 12/09/19 0931   12/07/19 1100  cefTRIAXone (ROCEPHIN) 2 g in sodium chloride 0.9 % 100 mL IVPB  Status:  Discontinued        2 g 200 mL/hr over 30 Minutes Intravenous Every 24 hours 12/07/19 1011 12/09/19 1000   12/06/19 2000  meropenem (MERREM) 1 g in sodium chloride 0.9 % 100 mL IVPB  Status:  Discontinued        1 g 200 mL/hr over 30 Minutes Intravenous Every 8 hours 12/06/19 1839 12/07/19 1011   12/06/19 1000  vancomycin (VANCOREADY) IVPB 1250 mg/250 mL  Status:  Discontinued        1,250 mg 166.7 mL/hr over 90 Minutes Intravenous Every 12 hours 12/06/19 0856 12/06/19 1839   12/06/19 0900  ceFEPIme (MAXIPIME) 2 g in sodium chloride 0.9 % 100 mL IVPB  Status:  Discontinued        2 g 200 mL/hr over 30 Minutes Intravenous Every 8 hours 12/06/19 0856 12/06/19 1839   12/06/19 0000  metroNIDAZOLE (FLAGYL) IVPB 500 mg  Status:  Discontinued        500 mg 100 mL/hr over 60 Minutes Intravenous Every 8 hours 12/05/19 1956 12/06/19 1839   12/05/19 1715  metroNIDAZOLE (FLAGYL) IVPB 500 mg  Status:  Discontinued        500 mg 100 mL/hr over 60 Minutes Intravenous Every 8 hours 12/05/19 1708 12/05/19 1956   12/05/19 1600  vancomycin (  VANCOREADY) IVPB 2000 mg/400 mL        2,000 mg 200 mL/hr over 120 Minutes Intravenous  Once 12/05/19 1503 12/05/19 1929   12/05/19 1415  ceFEPIme (MAXIPIME) 2 g in sodium chloride 0.9 % 100 mL IVPB        2 g 200 mL/hr over 30 Minutes Intravenous  Once 12/05/19 1407 12/05/19 1543   12/05/19 1415  metroNIDAZOLE (FLAGYL) IVPB 500 mg        500 mg 100 mL/hr over 60 Minutes Intravenous  Once 12/05/19 1407 12/05/19 1700   12/05/19 1415  vancomycin (VANCOCIN) IVPB 1000 mg/200 mL premix  Status:  Discontinued        1,000 mg 200 mL/hr over 60 Minutes Intravenous  Once 12/05/19 1407 12/05/19 1502      Assessment/Plan: Impression: Heart rate now well controlled.  Patient has no GI symptoms or evidence of cholecystitis.  As discussed  previously, would do a complete 10-day course of antibiotics.  Will sign off.  Please call us if we can be of further assistance.  LOS: 4 days    Aviva Signs 12/09/2019

## 2019-12-09 NOTE — Discharge Summary (Signed)
Physician Discharge Summary  Andrew Humphrey:[id number] DOB: 11-17-1940 DOA: 12/05/2019  PCP: Lanelle Bal, PA-C  79 Admit date: 12/05/2019 Discharge date: 12/09/2019  Admitted From: Home Disposition: Home  Recommendations for Outpatient Follow-up:  1. Follow up with PCP in 1-2 weeks 2. Please obtain BMP/CBC in one week 3. Follow-up with cardiology in the next 2 weeks  Home Health: Equipment/Devices:  Discharge Condition: Stable CODE STATUS: Full code Diet recommendation: Heart healthy  Brief/Interim Summary: 79 y.o.malewith medical history ofatrial fibrillation, hypertension, diabetes mellitus type 2, GI bleed presenting with generalized weakness and fevers and chills. The patient was initially seen in the emergency department on 12/02/2019 with left upper quadrant abdominal pain. CT of the abdomen and pelvis on 1021 showed mild pericholecystic inflammatory changes toward the gallbladder neck and moderate gallbladder distention. There was also some segmental thickening of the rectosigmoid colon with minimal pericolonic haze. Patient was treated symptomatically and discharged home in stable condition. On 12/03/2019 and 12/04/2019, the patient states that he was feeling well. He had denied any fevers, chills, chest pain or shortness breath, coughing, hemoptysis, nausea, vomiting, diarrhea. He states that his abdominal pain hadactually improved. The patient's significant other is also helping with the history. Apparently, his significant other stated that he began having some mild abdominal cramping again on the evening of 12/04/2019 although the patient denies this. The patient was eating fine over the weekend. He denied headache or neck pain. He does denies any new medications. On the morning of 12/05/2019, his significant other came over to check up on him and found him on the floor lethargic. EMS was activated. Apparently, the patient had bowel incontinence and emesis on  him. In the emergency department, the patient is alert and oriented x4. He actually denies any complaints presently including nausea, vomiting, diarrhea, domino pain, dysuria, hematuria. He did remember being incontinent of stool, but stated that he was too weak to get up. He denies any alcohol or illegal drug use. He does drink alcohol rarely.  In the emergency department, the patient was febrile to 101.4 F with hypotension with systolic blood pressure in the upper 80s. BMP showed a sodium 133, potassium 3.2, serum creatinine 1.29. LFTs were essentially unremarkable with lipase 19. Total bilirubin was 1.8. BBC 32.0, hemoglobin 9.5, platelets 168,000. Right upper quadrant abdominal sono showed no gallstones or gallbladder wall thickening. There was hepatic echogenicity. There was fatty sparing adjacent to the gallbladder fossa. Lactic acid was 5.4.The patient was given fluids and IV antibiotics.  Discharge Diagnoses:  Active Problems:   Hypertension   SIRS (systemic inflammatory response syndrome) (HCC)   AKI (acute kidney injury) ([place])   Uncontrolled type 2 diabetes mellitus with hyperglycemia (HCC)   Cholecystitis, acute   Sepsis due to Klebsiella ([place])  Sepsis due to Klebsiella -Present on admission, ruled in -Source felt to be acalculus cholecystitis -Patient was treated with IV antibiotics -Seen by general surgery, no indication for surgical management at this time -Clinically he improved with antibiotic therapy -He is no longer having any symptoms and WBC count has resolved to normal  Hyponatremia -Secondary to volume depletion -Continue IV fluids  Acute kidney injury -Secondary to sepsis and volume depletion -Baseline creatinine 0.6-0.8 -Presented with serum creatinine 1.29 -improvedwith IVF, creatinine back to baseline  Hypokalemia -Replete -Magnesium 1.7  Uncontrolled diabetes mellitus type 2 with hyperglycemia -Hemoglobin A1c--9.9 -Holding glipizide  and Metformin -NovoLog sliding scale -Blood sugars remain elevated, add basal insulin   Generalized weakness -Physical therapy evaluation  A. fib  with RVR -Became tachycardic on 10/13 -Started on Cardizem infusion -Cardiology consulted -Amiodarone infusion added to her regimen -On 10/14, patient converted back to sinus rhythm -He is chronically on Toprol-XL that had not been continued on admission due to soft blood pressures -He was restarted on metoprolol and amiodarone was changed to oral form for the next 2 weeks until follow-up -He was restarted on anticoagulation with Eliquis -Echo showed normal ejection fraction  Discharge Instructions  Discharge Instructions    Diet - low sodium heart healthy   Complete by: As directed    Increase activity slowly   Complete by: As directed      Allergies as of 12/09/2019   No Known Allergies     Medication List    TAKE these medications   amiodarone 200 MG tablet Commonly known as: PACERONE Take 2 tablets (400 mg total) by mouth 2 (two) times daily.   apixaban 5 MG Tabs tablet Commonly known as: ELIQUIS Take 1 tablet (5 mg total) by mouth 2 (two) times daily.   aspirin EC 81 MG tablet Take 1 tablet (81 mg total) by mouth every morning.   glipiZIDE 10 MG 24 hr tablet Commonly known as: GLUCOTROL XL Take 20 mg by mouth daily.   metFORMIN 1000 MG tablet Commonly known as: GLUCOPHAGE Take 1,000 mg by mouth in the morning and at bedtime.   metoprolol succinate 100 MG 24 hr tablet Commonly known as: TOPROL-XL Take 100 mg by mouth every morning.   omeprazole 40 MG capsule Commonly known as: PRILOSEC Take 40 mg by mouth daily as needed (indigestion).   sulfamethoxazole-trimethoprim 800-160 MG tablet Commonly known as: BACTRIM DS Take 1 tablet by mouth every 12 (twelve) hours. Start taking on: December 10, 2019       Follow-up Information    Erlene Quan, [place] Follow up on 12/19/2019.   Specialties: Cardiology,  Radiology Why: Cardiology Hospital Follow-up on 12/19/2019 at 2:00 PM.  Contact information: [address] [place] [address] [phone]              No Known Allergies  Consultations:  General surgery  Cardiology   Procedures/Studies: DG Chest 2 View  Result Date: 12/05/2019 CLINICAL DATA:  Fever, weakness. EXAM: CHEST - 2 VIEW COMPARISON:  December 02, 2019. FINDINGS: Stable cardiomegaly. No pneumothorax or pleural effusion is noted. Both lungs are clear. The visualized skeletal structures are unremarkable. IMPRESSION: No active cardiopulmonary disease. Electronically Signed   By: Marijo Conception M.D.   On: 12/05/2019 13:24   DG Chest 2 View  Result Date: 12/02/2019 CLINICAL DATA:  79 year old male with chest pain. EXAM: CHEST - 2 VIEW COMPARISON:  Chest radiograph dated 06/19/2017. FINDINGS: Faint bilateral peripheral and subpleural interstitial densities may be chronic or represent atypical infection. Clinical correlation is recommended. No lobar consolidation, pleural effusion, pneumothorax. Stable cardiac silhouette. No acute osseous pathology. IMPRESSION: Chronic changes versus atypical infection. No lobar consolidation. Electronically Signed   By: Anner Crete M.D.   On: 12/02/2019 15:37   CT ANGIO CHEST PE W OR WO CONTRAST  Addendum Date: 12/06/2019   ADDENDUM REPORT: 12/06/2019 14:02 ADDENDUM: In addition of findings outlined previously there is a nodule in the RIGHT mid chest that may represent a perifissural lymph node though based on size and in the acute setting. (This measures 8 mm) Non-contrast chest CT at 6-12 months is recommended. If the nodule is stable at time of repeat CT, then future CT at 18-24 months (from today's  scan) is considered optional for low-risk patients, but is recommended for high-risk patients. This recommendation follows the consensus statement: Guidelines for Management of Incidental Pulmonary Nodules Detected on CT Images: From the  Fleischner Society 2017; Radiology 2017; 284:228-243. These results will be called to the ordering clinician or representative by the Radiologist Assistant, and communication documented in the PACS or [employer]. Electronically Signed   By: Zetta Bills M.D.   On: 12/06/2019 14:02   Result Date: 12/06/2019 CLINICAL DATA:  Acute abdominal pain and shortness of breath. EXAM: CT ANGIOGRAPHY CHEST CT ABDOMEN AND PELVIS WITH CONTRAST TECHNIQUE: Multidetector CT imaging of the chest was performed using the standard protocol during bolus administration of intravenous contrast. Multiplanar CT image reconstructions and MIPs were obtained to evaluate the vascular anatomy. Multidetector CT imaging of the abdomen and pelvis was performed using the standard protocol during bolus administration of intravenous contrast. CONTRAST:  165mL OMNIPAQUE IOHEXOL 350 MG/ML SOLN COMPARISON:  Recent CT evaluation from 12/02/2019 FINDINGS: CTA CHEST FINDINGS Cardiovascular: Scattered atherosclerosis without signs of aneurysm of the thoracic aorta. Heart size mildly enlarged without pericardial effusion. Three-vessel coronary artery disease. 3.2 cm main pulmonary artery. No central or lobar level pulmonary embolism. Segmental branches with limited assessment in particular in the lung bases due to respiratory motion and bolus timing. Mediastinum/Nodes: Esophagus is normal. Thoracic inlet structures are unremarkable. No axillary lymphadenopathy no mediastinal or hilar lymphadenopathy Lungs/Pleura: Mild septal thickening, signs of some subpleural reticulation. Evidence of basilar atelectasis. No lobar consolidation. No pleural effusion. Airways are patent. Peri fissural nodule measures 8 x 7 mm (image 75 of series 4) Musculoskeletal: No chest wall mass. No acute musculoskeletal process. No destructive bone finding spinal degenerative changes. Review of the MIP images confirms the above findings. CT ABDOMEN and PELVIS FINDINGS  Hepatobiliary: Wall enhancement and pericholecystic stranding. Segmental enhancement and or increased density along the common bile duct. No intrahepatic biliary duct dilation. Pancreas: Pancreas with peripheral atrophy.  No ductal dilation. Spleen: Spleen normal in size and contour. Adrenals/Urinary Tract: Adrenal glands are normal. Symmetric renal enhancement.  No hydronephrosis. Stomach/Bowel: Small hiatal hernia. No acute small bowel process. Signs of diverticular disease. Stranding about the hepatic flexure of the colon is likely secondary to acute cholecystitis Vascular/Lymphatic: Calcified and noncalcified atheromatous plaque in the thoracic aorta extends into the abdominal aorta. No sign of aneurysm. There is no gastrohepatic or hepatoduodenal ligament lymphadenopathy. No retroperitoneal or mesenteric lymphadenopathy. No pelvic sidewall lymphadenopathy. Reproductive: Heterogeneous prostate similar to the prior exam. Other: Small to moderate fat containing bilateral inguinal hernias and small fat containing umbilical hernia. Musculoskeletal: No acute musculoskeletal process. No destructive bone finding. Spinal degenerative changes. Review of the MIP images confirms the above findings. IMPRESSION: 1. No signs of central or lobar level pulmonary embolism. Segmental branches with limited assessment in particular in the lung bases due to respiratory motion and bolus timing. 2. Mild septal thickening, signs of some subpleural reticulation, may represent mild interstitial lung disease. 3. CT findings of acute cholecystitis with interval increase in pericholecystic stranding when compared to prior imaging. HIDA scan could be helpful for added specificity in the setting of acute cholecystitis if there is some doubt as to diagnosis based on clinical picture. 4. Segmental enhancement and or increased density along the common bile duct may represent choledocholithiasis or cholangitis as it is slightly longer segment  than expected for biliary calculi. No intrahepatic biliary duct dilation. Follow-up CT with contrast or MRI/MRCP may be helpful to exclude underlying lesion. 5.  Signs of diverticular disease. 6. Small hiatal hernia. 7. Small to moderate fat containing bilateral inguinal hernias and small fat containing umbilical hernia. 8. Aortic atherosclerosis. Aortic Atherosclerosis (ICD10-I70.0). Electronically Signed: By: Zetta Bills M.D. On: 12/06/2019 13:39   CT ABDOMEN PELVIS W CONTRAST  Addendum Date: 12/06/2019   ADDENDUM REPORT: 12/06/2019 14:02 ADDENDUM: In addition of findings outlined previously there is a nodule in the RIGHT mid chest that may represent a perifissural lymph node though based on size and in the acute setting. (This measures 8 mm) Non-contrast chest CT at 6-12 months is recommended. If the nodule is stable at time of repeat CT, then future CT at 18-24 months (from today's scan) is considered optional for low-risk patients, but is recommended for high-risk patients. This recommendation follows the consensus statement: Guidelines for Management of Incidental Pulmonary Nodules Detected on CT Images: From the Fleischner Society 2017; Radiology 2017; 284:228-243. These results will be called to the ordering clinician or representative by the Radiologist Assistant, and communication documented in the PACS or [employer]. Electronically Signed   By: Zetta Bills M.D.   On: 12/06/2019 14:02   Result Date: 12/06/2019 CLINICAL DATA:  Acute abdominal pain and shortness of breath. EXAM: CT ANGIOGRAPHY CHEST CT ABDOMEN AND PELVIS WITH CONTRAST TECHNIQUE: Multidetector CT imaging of the chest was performed using the standard protocol during bolus administration of intravenous contrast. Multiplanar CT image reconstructions and MIPs were obtained to evaluate the vascular anatomy. Multidetector CT imaging of the abdomen and pelvis was performed using the standard protocol during bolus administration of  intravenous contrast. CONTRAST:  145mL OMNIPAQUE IOHEXOL 350 MG/ML SOLN COMPARISON:  Recent CT evaluation from 12/02/2019 FINDINGS: CTA CHEST FINDINGS Cardiovascular: Scattered atherosclerosis without signs of aneurysm of the thoracic aorta. Heart size mildly enlarged without pericardial effusion. Three-vessel coronary artery disease. 3.2 cm main pulmonary artery. No central or lobar level pulmonary embolism. Segmental branches with limited assessment in particular in the lung bases due to respiratory motion and bolus timing. Mediastinum/Nodes: Esophagus is normal. Thoracic inlet structures are unremarkable. No axillary lymphadenopathy no mediastinal or hilar lymphadenopathy Lungs/Pleura: Mild septal thickening, signs of some subpleural reticulation. Evidence of basilar atelectasis. No lobar consolidation. No pleural effusion. Airways are patent. Peri fissural nodule measures 8 x 7 mm (image 75 of series 4) Musculoskeletal: No chest wall mass. No acute musculoskeletal process. No destructive bone finding spinal degenerative changes. Review of the MIP images confirms the above findings. CT ABDOMEN and PELVIS FINDINGS Hepatobiliary: Wall enhancement and pericholecystic stranding. Segmental enhancement and or increased density along the common bile duct. No intrahepatic biliary duct dilation. Pancreas: Pancreas with peripheral atrophy.  No ductal dilation. Spleen: Spleen normal in size and contour. Adrenals/Urinary Tract: Adrenal glands are normal. Symmetric renal enhancement.  No hydronephrosis. Stomach/Bowel: Small hiatal hernia. No acute small bowel process. Signs of diverticular disease. Stranding about the hepatic flexure of the colon is likely secondary to acute cholecystitis Vascular/Lymphatic: Calcified and noncalcified atheromatous plaque in the thoracic aorta extends into the abdominal aorta. No sign of aneurysm. There is no gastrohepatic or hepatoduodenal ligament lymphadenopathy. No retroperitoneal or  mesenteric lymphadenopathy. No pelvic sidewall lymphadenopathy. Reproductive: Heterogeneous prostate similar to the prior exam. Other: Small to moderate fat containing bilateral inguinal hernias and small fat containing umbilical hernia. Musculoskeletal: No acute musculoskeletal process. No destructive bone finding. Spinal degenerative changes. Review of the MIP images confirms the above findings. IMPRESSION: 1. No signs of central or lobar level pulmonary embolism. Segmental branches with limited assessment in  particular in the lung bases due to respiratory motion and bolus timing. 2. Mild septal thickening, signs of some subpleural reticulation, may represent mild interstitial lung disease. 3. CT findings of acute cholecystitis with interval increase in pericholecystic stranding when compared to prior imaging. HIDA scan could be helpful for added specificity in the setting of acute cholecystitis if there is some doubt as to diagnosis based on clinical picture. 4. Segmental enhancement and or increased density along the common bile duct may represent choledocholithiasis or cholangitis as it is slightly longer segment than expected for biliary calculi. No intrahepatic biliary duct dilation. Follow-up CT with contrast or MRI/MRCP may be helpful to exclude underlying lesion. 5. Signs of diverticular disease. 6. Small hiatal hernia. 7. Small to moderate fat containing bilateral inguinal hernias and small fat containing umbilical hernia. 8. Aortic atherosclerosis. Aortic Atherosclerosis (ICD10-I70.0). Electronically Signed: By: Zetta Bills M.D. On: 12/06/2019 13:39   CT Abdomen Pelvis W Contrast  Result Date: 12/02/2019 CLINICAL DATA:  Epigastric and left lower quadrant pain with chest pain onset last night EXAM: CT ABDOMEN AND PELVIS WITH CONTRAST TECHNIQUE: Multidetector CT imaging of the abdomen and pelvis was performed using the standard protocol following bolus administration of intravenous contrast.  CONTRAST:  158mL OMNIPAQUE IOHEXOL 300 MG/ML  SOLN COMPARISON:  CT abdomen pelvis 10/09/2009 (report only, CT abdomen pelvis 01/24/2004 FINDINGS: Lower chest: Atelectatic changes are present in the lung bases. Additional subpleural reticular features are noted, incompletely assessed on this exam. No consolidation or pleural effusion. Normal heart size. No pericardial effusion. Three-vessel coronary artery calcifications. Calcifications of the aortic leaflets and mitral annulus as well. Hepatobiliary: Diffuse hepatic hypoattenuation compatible with hepatic steatosis. Sparing along the gallbladder fossa. No focal worrisome liver lesion. Mild pericholecystic inflammatory change particularly towards the neck of the gallbladder with moderate gallbladder distension. No visible calcified gallstone is seen. No biliary ductal dilatation or visible intraductal gallstones. Pancreas: Unremarkable. No pancreatic ductal dilatation or surrounding inflammatory changes. Spleen: Normal in size. No concerning splenic lesions. Adrenals/Urinary Tract: Normal adrenal glands. Kidneys are normally located with symmetric enhancement and excretion. Few scattered subcentimeter hypoattenuating foci in both kidneys too small to fully characterize on CT imaging but statistically likely benign. Nonobstructing calculus seen in the lower pole left kidney measuring up to 3 mm in size. No suspicious renal lesion, obstructive urolithiasis or hydronephrosis. Urinary bladder is largely decompressed at the time of exam and therefore poorly evaluated by CT imaging. No acute bladder abnormality. Mild indentation of the bladder base by the prostate. Stomach/Bowel: Small sliding-type hiatal hernia. Distal stomach and duodenum are unremarkable. No small bowel thickening or dilatation. Thickening towards the hepatic flexure is favored to be reactive to the process in the gallbladder fossa. Distal colonic diverticulosis is noted. Some mild segmental thickening  of the rectosigmoid is noted albeit not focally centered upon any particular culprit diverticulum. Minimal pericolonic haze. Could reflect a mild colitis versus sequela of prior inflammation. No evidence of obstruction. Appendix is not visualized. No focal inflammation the vicinity of the cecum to suggest an occult appendicitis. Vascular/Lymphatic: Atherosclerotic calcifications within the abdominal aorta and branch vessels. No aneurysm or ectasia. No enlarged abdominopelvic lymph nodes. Reproductive: Few calcifications within a borderline enlarged prostate. Seminal vesicles are unremarkable. Other: Inflammatory changes predominantly along the gallbladder fossa and inferior tip of the liver as well as minimally adjacent the rectosigmoid though this may be redistributed inflammatory change. No free air or fluid. No organized collection or abscess. Multilevel degenerative changes are present in the  imaged portions of the spine. No acute osseous abnormality or suspicious osseous lesion. Musculoskeletal: Multilevel degenerative changes are present in the imaged portions of the spine. Minimal retrolisthesis L1 on L2 without pars defects. Possible subacute to chronic subcortical impaction fracture of anterosuperior endplate L3 with up to 78% height loss. No other acute or suspicious osseous abnormalities. Additional degenerative changes in the hips and pelvis. IMPRESSION: 1. Mild pericholecystic inflammatory change particularly towards the neck of the gallbladder with moderate gallbladder distension. No visible calcified gallstone is seen. Findings are concerning for acute cholecystitis. Recommend further evaluation with right upper quadrant ultrasound. 2. Some mild segmental thickening of the rectosigmoid is noted albeit not focally centered upon any particular culprit diverticulum. Minimal pericolonic haze. Could reflect a mild colitis versus sequela of prior inflammation. Correlate with clinical symptoms and consider  outpatient evaluation with colonoscopy if not recently performed. 3. Nonobstructing left nephrolithiasis. 4. Likely subacute to chronic subcortical impaction fracture of anterosuperior endplate L3 with up to 29% height loss. Correlate for point tenderness to exclude acuity. 5. Hepatic steatosis. 6. Aortic Atherosclerosis (ICD10-I70.0). Electronically Signed   By: Lovena Le M.D.   On: 12/02/2019 18:17   MR 3D Recon At Scanner  Result Date: 12/06/2019 CLINICAL DATA:  Right upper quadrant abdominal pain EXAM: MRI ABDOMEN WITHOUT AND WITH CONTRAST (INCLUDING MRCP) TECHNIQUE: Multiplanar multisequence MR imaging of the abdomen was performed both before and after the administration of intravenous contrast. Heavily T2-weighted images of the biliary and pancreatic ducts were obtained, and three-dimensional MRCP images were rendered by post processing. CONTRAST:  80mL GADAVIST GADOBUTROL 1 MMOL/ML IV SOLN COMPARISON:  CT abdomen 12/06/2019 FINDINGS: Despite efforts by the technologist and patient, motion artifact is present on today's exam and could not be eliminated. This reduces exam sensitivity and specificity. Lower chest: Mildly accentuated interstitium. Mild cardiomegaly. No significant pleural effusion. Hepatobiliary: Gallbladder wall thickening is present along with edema in the adipose tissues adjacent to the gallbladder favoring gallbladder inflammation. No gallstones are identified. No gas is identified in the gallbladder wall. On post-contrast images, there is only mildly accentuated enhancement in the gallbladder wall. No gallbladder mass is evident. 0.6 cm cyst in segment 4 of the liver on image 13 of series 12. No biliary dilatation is observed. No significant abnormal hepatic enhancement. Pancreas:  Unremarkable Spleen:  Unremarkable Adrenals/Urinary Tract: 1.0 by 0.6 cm right adrenal myelolipoma. Mild scarring of the right mid kidney posteriorly. Stomach/Bowel: Unremarkable Vascular/Lymphatic:  Aortoiliac atherosclerotic vascular disease. No pathologic adenopathy. Other:  No supplemental non-categorized findings. Musculoskeletal: Thoracic and lumbar spondylosis and degenerative disc disease. IMPRESSION: 1. Gallbladder wall thickening and edema in the adipose tissues adjacent to the gallbladder favoring gallbladder inflammation/acalculous cholecystitis. No gallstones are identified. 2. No biliary dilatation or choledocholithiasis is identified. 3. Small right adrenal myelolipoma. 4. Mild scarring of the right mid kidney posteriorly. 5. Mild cardiomegaly. 6. Thoracic and lumbar spondylosis and degenerative disc disease. Electronically Signed   By: Van Clines M.D.   On: 12/06/2019 16:23   US Abdomen Limited  Result Date: 12/05/2019 CLINICAL DATA:  Right upper quadrant abdominal pain. EXAM: ULTRASOUND ABDOMEN LIMITED RIGHT UPPER QUADRANT COMPARISON:  December 02, 2019. FINDINGS: Gallbladder: No gallstones or wall thickening visualized. No sonographic Murphy sign noted by sonographer. Common bile duct: Diameter: 2 mm which is within normal limits. Liver: Increased echogenicity of hepatic parenchyma is noted suggesting hepatic steatosis with probable fatty sparing adjacent to gallbladder fossa. Portal vein is patent on color Doppler imaging with normal direction of blood  flow towards the liver. Other: None. IMPRESSION: Increased echogenicity of hepatic parenchyma is noted with probable fatty sparing adjacent to gallbladder fossa. Electronically Signed   By: Marijo Conception M.D.   On: 12/05/2019 13:49   DG CHEST PORT 1 VIEW  Result Date: 12/08/2019 CLINICAL DATA:  Shortness of breath EXAM: PORTABLE CHEST 1 VIEW COMPARISON:  12/05/2019 FINDINGS: Bilateral interstitial opacities, likely pulmonary edema. No pleural effusion or pneumothorax. Normal cardiomediastinal contours. IMPRESSION: Moderate interstitial pulmonary edema. Electronically Signed   By: Ulyses Jarred M.D.   On: 12/08/2019 01:03   MR  ABDOMEN MRCP W WO CONTAST  Result Date: 12/06/2019 CLINICAL DATA:  Right upper quadrant abdominal pain EXAM: MRI ABDOMEN WITHOUT AND WITH CONTRAST (INCLUDING MRCP) TECHNIQUE: Multiplanar multisequence MR imaging of the abdomen was performed both before and after the administration of intravenous contrast. Heavily T2-weighted images of the biliary and pancreatic ducts were obtained, and three-dimensional MRCP images were rendered by post processing. CONTRAST:  60mL GADAVIST GADOBUTROL 1 MMOL/ML IV SOLN COMPARISON:  CT abdomen 12/06/2019 FINDINGS: Despite efforts by the technologist and patient, motion artifact is present on today's exam and could not be eliminated. This reduces exam sensitivity and specificity. Lower chest: Mildly accentuated interstitium. Mild cardiomegaly. No significant pleural effusion. Hepatobiliary: Gallbladder wall thickening is present along with edema in the adipose tissues adjacent to the gallbladder favoring gallbladder inflammation. No gallstones are identified. No gas is identified in the gallbladder wall. On post-contrast images, there is only mildly accentuated enhancement in the gallbladder wall. No gallbladder mass is evident. 0.6 cm cyst in segment 4 of the liver on image 13 of series 12. No biliary dilatation is observed. No significant abnormal hepatic enhancement. Pancreas:  Unremarkable Spleen:  Unremarkable Adrenals/Urinary Tract: 1.0 by 0.6 cm right adrenal myelolipoma. Mild scarring of the right mid kidney posteriorly. Stomach/Bowel: Unremarkable Vascular/Lymphatic: Aortoiliac atherosclerotic vascular disease. No pathologic adenopathy. Other:  No supplemental non-categorized findings. Musculoskeletal: Thoracic and lumbar spondylosis and degenerative disc disease. IMPRESSION: 1. Gallbladder wall thickening and edema in the adipose tissues adjacent to the gallbladder favoring gallbladder inflammation/acalculous cholecystitis. No gallstones are identified. 2. No biliary  dilatation or choledocholithiasis is identified. 3. Small right adrenal myelolipoma. 4. Mild scarring of the right mid kidney posteriorly. 5. Mild cardiomegaly. 6. Thoracic and lumbar spondylosis and degenerative disc disease. Electronically Signed   By: Van Clines M.D.   On: 12/06/2019 16:23   ECHOCARDIOGRAM COMPLETE  Result Date: 12/09/2019    ECHOCARDIOGRAM REPORT   Patient Name:   DENNEY SHEIN Date of Exam: 12/09/2019 Medical Rec #:  [id number]         Height:       67.0 in Accession #:    [id number]        Weight:       202.8 lb Date of Birth:  11-15-1940         BSA:          2.034 m Patient Age:    79 years          BP:           141/96 mmHg Patient Gender: M                 HR:           69 bpm. Exam Location:  [facility] Procedure: 2D Echo, Cardiac Doppler and Color Doppler Indications:    I48.0 Paroxysmal atrial fibrillation  History:        Patient has prior  history of Echocardiogram examinations, most                 recent 06/20/2017. Risk Factors:Hypertension and Diabetes.  Sonographer:    Tiffany Dance Referring Phys: [id number] Nessie Nong IMPRESSIONS  1. MIld distal septal hypokinesis. . Left ventricular ejection fraction, by estimation, is 55 to 60%. The left ventricle has normal function. The left ventricle demonstrates regional wall motion abnormalities (see scoring diagram/findings for description). There is mild left ventricular hypertrophy. Left ventricular diastolic parameters were normal.  2. Right ventricular systolic function is mildly reduced. The right ventricular size is normal. There is mildly elevated pulmonary artery systolic pressure.  3. Left atrial size was mild to moderately dilated.  4. The mitral valve is abnormal. Moderate mitral valve regurgitation.  5. The aortic valve is abnormal. Aortic valve regurgitation is trivial. Mild to moderate aortic valve sclerosis/calcification is present, without any evidence of aortic stenosis.  6. The inferior vena cava is dilated  in size with <50% respiratory variability, suggesting right atrial pressure of 15 mmHg. FINDINGS  Left Ventricle: MIld distal septal hypokinesis. Left ventricular ejection fraction, by estimation, is 55 to 60%. The left ventricle has normal function. The left ventricle demonstrates regional wall motion abnormalities. The left ventricular internal cavity size was normal in size. There is mild left ventricular hypertrophy. Left ventricular diastolic parameters were normal. Right Ventricle: The right ventricular size is normal. Right vetricular wall thickness was not assessed. Right ventricular systolic function is mildly reduced. There is mildly elevated pulmonary artery systolic pressure. The tricuspid regurgitant velocity is 2.65 m/s, and with an assumed right atrial pressure of 15 mmHg, the estimated right ventricular systolic pressure is 68.3 mmHg. Left Atrium: Left atrial size was mild to moderately dilated. Right Atrium: Right atrial size was normal in size. Pericardium: There is no evidence of pericardial effusion. Mitral Valve: The mitral valve is abnormal. There is mild thickening of the mitral valve leaflet(s). Mild to moderate mitral annular calcification. Moderate mitral valve regurgitation. Tricuspid Valve: The tricuspid valve is normal in structure. Tricuspid valve regurgitation is mild. Aortic Valve: The aortic valve is abnormal. Aortic valve regurgitation is trivial. Mild to moderate aortic valve sclerosis/calcification is present, without any evidence of aortic stenosis. Aortic valve mean gradient measures 7.0 mmHg. Aortic valve peak gradient measures 10.9 mmHg. Aortic valve area, by VTI measures 0.72 cm. Pulmonic Valve: The pulmonic valve was grossly normal. Pulmonic valve regurgitation is trivial. Aorta: The aortic root and ascending aorta are structurally normal, with no evidence of dilitation. Venous: The inferior vena cava is dilated in size with less than 50% respiratory variability, suggesting  right atrial pressure of 15 mmHg. IAS/Shunts: No atrial level shunt detected by color flow Doppler.  LEFT VENTRICLE PLAX 2D LVIDd:         4.56 cm  Diastology LVIDs:         3.12 cm  LV e' medial:    6.74 cm/s LV PW:         1.40 cm  LV E/e' medial:  18.4 LV IVS:        1.27 cm  LV e' lateral:   7.29 cm/s LVOT diam:     1.40 cm  LV E/e' lateral: 17.0 LV SV:         26 LV SV Index:   13 LVOT Area:     1.54 cm  RIGHT VENTRICLE RV Basal diam:  2.75 cm RV S prime:     8.59 cm/s TAPSE (M-mode): 1.8  cm LEFT ATRIUM              Index       RIGHT ATRIUM           Index LA diam:        4.80 cm  2.36 cm/m  RA Area:     15.30 cm LA Vol (A2C):   112.0 ml 55.05 ml/m RA Volume:   31.50 ml  15.48 ml/m LA Vol (A4C):   80.4 ml  39.52 ml/m LA Biplane Vol: 95.2 ml  46.80 ml/m  AORTIC VALVE AV Area (Vmax):    0.79 cm AV Area (Vmean):   0.77 cm AV Area (VTI):     0.72 cm AV Vmax:           165.00 cm/s AV Vmean:          124.000 cm/s AV VTI:            0.356 m AV Peak Grad:      10.9 mmHg AV Mean Grad:      7.0 mmHg LVOT Vmax:         85.00 cm/s LVOT Vmean:        61.900 cm/s LVOT VTI:          0.167 m LVOT/AV VTI ratio: 0.47  AORTA Ao Root diam: 3.70 cm Ao Asc diam:  3.20 cm MITRAL VALVE                 TRICUSPID VALVE MV Area (PHT): 4.10 cm      TR Peak grad:   28.1 mmHg MV Decel Time: 185 msec      TR Vmax:        265.00 cm/s MR Peak grad:    121.0 mmHg MR Mean grad:    77.0 mmHg   SHUNTS MR Vmax:         550.00 cm/s Systemic VTI:  0.17 m MR Vmean:        411.0 cm/s  Systemic Diam: 1.40 cm MR PISA:         1.01 cm MR PISA Eff ROA: 7 mm MR PISA Radius:  0.40 cm MV E velocity: 124.00 cm/s MV A velocity: 88.60 cm/s MV E/A ratio:  1.40 Dorris Carnes MD Electronically signed by Dorris Carnes MD Signature Date/Time: 12/09/2019/5:36:58 PM    Final       Subjective: No chest pain, shortness of breath, abdominal pain  Discharge Exam: Vitals:   12/09/19 0900 12/09/19 1100 12/09/19 1200 12/09/19 1600  BP: 120/82 133/87 (!)  152/93   Pulse: 71 65 70   Resp: (!) 23 18 19 17   Temp:  97.8 F (36.6 C)    TempSrc:      SpO2: 94% 95% 99%   Weight:      Height:        General: Pt is alert, awake, not in acute distress Cardiovascular: RRR, S1/S2 +, no rubs, no gallops Respiratory: CTA bilaterally, no wheezing, no rhonchi Abdominal: Soft, NT, ND, bowel sounds + Extremities: no edema, no cyanosis    The results of significant diagnostics from this hospitalization (including imaging, microbiology, ancillary and laboratory) are listed below for reference.     Microbiology: Recent Results (from the past 240 hour(s))  Respiratory Panel by RT PCR (Flu A&B, Covid) - Nasopharyngeal Swab     Status: None   Collection Time: 12/05/19 12:27 PM   Specimen: Nasopharyngeal Swab  Result Value Ref Range Status   SARS Coronavirus 2  by RT PCR NEGATIVE NEGATIVE Final    Comment: (NOTE) SARS-CoV-2 target nucleic acids are NOT DETECTED.  The SARS-CoV-2 RNA is generally detectable in upper respiratoy specimens during the acute phase of infection. The lowest concentration of SARS-CoV-2 viral copies this assay can detect is 131 copies/mL. A negative result does not preclude SARS-Cov-2 infection and should not be used as the sole basis for treatment or other patient management decisions. A negative result may occur with  improper specimen collection/handling, submission of specimen other than nasopharyngeal swab, presence of viral mutation(s) within the areas targeted by this assay, and inadequate number of viral copies (<131 copies/mL). A negative result must be combined with clinical observations, patient history, and epidemiological information. The expected result is Negative.  Fact Sheet for Patients:  PinkCheek.be  Fact Sheet for Healthcare Providers:  GravelBags.it  This test is no t yet approved or cleared by the [place] FDA and  has been authorized  for detection and/or diagnosis of SARS-CoV-2 by FDA under an Emergency Use Authorization (EUA). This EUA will remain  in effect (meaning this test can be used) for the duration of the COVID-19 declaration under Section 564(b)(1) of the Act, 21 U.S.C. section 360bbb-3(b)(1), unless the authorization is terminated or revoked sooner.     Influenza A by PCR NEGATIVE NEGATIVE Final   Influenza B by PCR NEGATIVE NEGATIVE Final    Comment: (NOTE) The Xpert Xpress SARS-CoV-2/FLU/RSV assay is intended as an aid in  the diagnosis of influenza from Nasopharyngeal swab specimens and  should not be used as a sole basis for treatment. Nasal washings and  aspirates are unacceptable for Xpert Xpress SARS-CoV-2/FLU/RSV  testing.  Fact Sheet for Patients: PinkCheek.be  Fact Sheet for Healthcare Providers: GravelBags.it  This test is not yet approved or cleared by the [place] FDA and  has been authorized for detection and/or diagnosis of SARS-CoV-2 by  FDA under an Emergency Use Authorization (EUA). This EUA will remain  in effect (meaning this test can be used) for the duration of the  Covid-19 declaration under Section 564(b)(1) of the Act, 21  U.S.C. section 360bbb-3(b)(1), unless the authorization is  terminated or revoked. Performed at [facility], [address] [place], [place] [address]   Blood culture (routine x 2)     Status: None (Preliminary result)   Collection Time: 12/05/19 12:50 PM   Specimen: BLOOD  Result Value Ref Range Status   Specimen Description BLOOD LEFT ANTECUBITAL DRAWN BY RN  Final   Special Requests   Final    BOTTLES DRAWN AEROBIC AND ANAEROBIC Blood Culture adequate volume   Culture   Final    NO GROWTH 4 DAYS Performed at [facility], [address], [place], [place] [address]    Report Status PENDING  Incomplete  Blood culture (routine x 2)     Status: Abnormal   Collection Time: 12/05/19 12:53  PM   Specimen: BLOOD RIGHT FOREARM  Result Value Ref Range Status   Specimen Description   Final    BLOOD RIGHT FOREARM DRAWN BY RN Performed at [facility], [address], [place], [place] [address]    Special Requests   Final    BOTTLES DRAWN AEROBIC AND ANAEROBIC Blood Culture results may not be optimal due to an excessive volume of blood received in culture bottles Performed at [facility], [address], [place], [place] [address]    Culture  Setup Time   Final    GRAM NEGATIVE RODS CRITICAL  RESULT CALLED TO, READ BACK BY AND VERIFIED WITH: SHANNON BROWN @1242  ON 10/12 BY TJ AEROBIC BOTTLE ONLY Performed at [facility], [place] [address] [place], [place] [address]    Culture KLEBSIELLA OXYTOCA (A)  Final   Report Status 12/08/2019 FINAL  Final   Organism ID, Bacteria KLEBSIELLA OXYTOCA  Final      Susceptibility   Klebsiella oxytoca - MIC*    AMPICILLIN >=32 RESISTANT Resistant     CEFAZOLIN 16 SENSITIVE Sensitive     CEFEPIME <=0.12 SENSITIVE Sensitive     CEFTAZIDIME <=1 SENSITIVE Sensitive     CEFTRIAXONE <=0.25 SENSITIVE Sensitive     CIPROFLOXACIN 2 INTERMEDIATE Intermediate     GENTAMICIN <=1 SENSITIVE Sensitive     IMIPENEM <=0.25 SENSITIVE Sensitive     TRIMETH/SULFA <=20 SENSITIVE Sensitive     AMPICILLIN/SULBACTAM INTERMEDIATE Intermediate     PIP/TAZO 8 SENSITIVE Sensitive     * KLEBSIELLA OXYTOCA  Blood Culture ID Panel (Reflexed)     Status: Abnormal   Collection Time: 12/05/19 12:53 PM  Result Value Ref Range Status   Enterococcus faecalis NOT DETECTED NOT DETECTED Final   Enterococcus Faecium NOT DETECTED NOT DETECTED Final   Listeria monocytogenes NOT DETECTED NOT DETECTED Final   Staphylococcus species NOT DETECTED NOT DETECTED Final   Staphylococcus aureus (BCID) NOT DETECTED NOT DETECTED Final   Staphylococcus epidermidis NOT DETECTED NOT DETECTED Final   Staphylococcus lugdunensis NOT DETECTED NOT DETECTED Final   Streptococcus species NOT  DETECTED NOT DETECTED Final   Streptococcus agalactiae NOT DETECTED NOT DETECTED Final   Streptococcus pneumoniae NOT DETECTED NOT DETECTED Final   Streptococcus pyogenes NOT DETECTED NOT DETECTED Final   A.calcoaceticus-baumannii NOT DETECTED NOT DETECTED Final   Bacteroides fragilis NOT DETECTED NOT DETECTED Final   Enterobacterales DETECTED (A) NOT DETECTED Final    Comment: Enterobacterales represent a large order of gram negative bacteria, not a single organism. CRITICAL RESULT CALLED TO, READ BACK BY AND VERIFIED WITH: T VOGLER RN 12/06/19 AT 4097 SK    Enterobacter cloacae complex NOT DETECTED NOT DETECTED Final   Escherichia coli NOT DETECTED NOT DETECTED Final   Klebsiella aerogenes NOT DETECTED NOT DETECTED Final   Klebsiella oxytoca DETECTED (A) NOT DETECTED Final    Comment: CRITICAL RESULT CALLED TO, READ BACK BY AND VERIFIED WITH: T VOGLER RN 12/06/19 AT 1810 SK    Klebsiella pneumoniae NOT DETECTED NOT DETECTED Final   Proteus species NOT DETECTED NOT DETECTED Final   Salmonella species NOT DETECTED NOT DETECTED Final   Serratia marcescens NOT DETECTED NOT DETECTED Final   Haemophilus influenzae NOT DETECTED NOT DETECTED Final   Neisseria meningitidis NOT DETECTED NOT DETECTED Final   Pseudomonas aeruginosa NOT DETECTED NOT DETECTED Final   Stenotrophomonas maltophilia NOT DETECTED NOT DETECTED Final   Candida albicans NOT DETECTED NOT DETECTED Final   Candida auris NOT DETECTED NOT DETECTED Final   Candida glabrata NOT DETECTED NOT DETECTED Final   Candida krusei NOT DETECTED NOT DETECTED Final   Candida parapsilosis NOT DETECTED NOT DETECTED Final   Candida tropicalis NOT DETECTED NOT DETECTED Final   Cryptococcus neoformans/gattii NOT DETECTED NOT DETECTED Final   CTX-M ESBL NOT DETECTED NOT DETECTED Final   Carbapenem resistance IMP NOT DETECTED NOT DETECTED Final   Carbapenem resistance KPC NOT DETECTED NOT DETECTED Final   Carbapenem resistance NDM NOT  DETECTED NOT DETECTED Final   Carbapenem resist OXA 48 LIKE NOT DETECTED NOT DETECTED Final   Carbapenem resistance VIM  NOT DETECTED NOT DETECTED Final    Comment: Performed at [facility], [place] [address] [place], [place] [address]  Urine culture     Status: Abnormal   Collection Time: 12/05/19  1:48 PM   Specimen: Urine, Random  Result Value Ref Range Status   Specimen Description   Final    URINE, RANDOM Performed at [facility], [address] [place], [place] [address]    Special Requests   Final    NONE Performed at [facility], [address], [place], [place] [address]    Culture (A)  Final    <10,000 COLONIES/mL INSIGNIFICANT GROWTH Performed at [facility], [place] [address], [place], [place] [address]    Report Status 12/06/2019 FINAL  Final  MRSA PCR Screening     Status: None   Collection Time: 12/05/19  3:16 PM   Specimen: Nasal Mucosa; Nasopharyngeal  Result Value Ref Range Status   MRSA by PCR NEGATIVE NEGATIVE Final    Comment:        The GeneXpert MRSA Assay (FDA approved for NASAL specimens only), is one component of a comprehensive MRSA colonization surveillance program. It is not intended to diagnose MRSA infection nor to guide or monitor treatment for MRSA infections. Performed at [facility], [address] [place], [place] [address]      Labs: BNP (last 3 results) No results for input(s): BNP in the last 8760 hours. Basic Metabolic Panel: Recent Labs  Lab 12/05/19 1202 12/06/19 0447 12/07/19 0613 12/08/19 0149 12/09/19 0336  NA 133* 134* 134* 134* 134*  K 3.2* 4.1 3.7 3.3* 3.6  CL 102 106 107 108 106  CO2 16* 19* 21* 16* 19*  GLUCOSE 205* 215* 280* 224* 189*  BUN 17 19 21 20 17   CREATININE 1.29* 0.82 0.68 0.82 0.65  CALCIUM 8.3* 7.5* 7.7* 8.2* 8.1*  MG  --   --   --  1.7  --    Liver Function Tests: Recent Labs  Lab 12/05/19 1202 12/06/19 0447 12/07/19 0613 12/08/19 0149  AST 34 47* 29 32  ALT 26 36 29 30   ALKPHOS 70 54 54 103  BILITOT 1.8* 0.9 0.7 0.7  PROT 6.5 5.6* 5.6* 5.9*  ALBUMIN 3.0* 2.5* 2.5* 2.7*   Recent Labs  Lab 12/05/19 1202 12/07/19 0613  LIPASE 19 17   No results for input(s): AMMONIA in the last 168 hours. CBC: Recent Labs  Lab 12/05/19 1202 12/06/19 0447 12/07/19 0613 12/09/19 0336  WBC 32.0* 19.3* 10.8* 7.5  NEUTROABS 29.4*  --   --   --   HGB 11.5* 9.5* 9.0* 10.1*  HCT 33.5* 27.9* 27.0* 29.3*  MCV 95.7 96.9 98.9 94.5  PLT 168 124* 91* 116*   Cardiac Enzymes: No results for input(s): CKTOTAL, CKMB, CKMBINDEX, TROPONINI in the last 168 hours. BNP: Invalid input(s): POCBNP CBG: Recent Labs  Lab 12/08/19 1637 12/08/19 2057 12/09/19 0735 12/09/19 1121 12/09/19 1611  GLUCAP 262* 164* 149* 178* 132*   D-Dimer No results for input(s): DDIMER in the last 72 hours. Hgb A1c No results for input(s): HGBA1C in the last 72 hours. Lipid Profile No results for input(s): CHOL, HDL, LDLCALC, TRIG, CHOLHDL, LDLDIRECT in the last 72 hours. Thyroid function studies No results for input(s): TSH, T4TOTAL, T3FREE, THYROIDAB in the last 72 hours.  Invalid input(s): FREET3 Anemia work up No results for input(s): VITAMINB12, FOLATE, FERRITIN, TIBC, IRON, RETICCTPCT in the last 72 hours. Urinalysis    Component  Value Date/Time   COLORURINE AMBER (A) 12/05/2019 1347   APPEARANCEUR CLOUDY (A) 12/05/2019 1347   LABSPEC 1.020 12/05/2019 1347   PHURINE 5.0 12/05/2019 1347   GLUCOSEU >=500 (A) 12/05/2019 1347   HGBUR NEGATIVE 12/05/2019 1347   BILIRUBINUR SMALL (A) 12/05/2019 1347   KETONESUR NEGATIVE 12/05/2019 1347   PROTEINUR 30 (A) 12/05/2019 1347   NITRITE NEGATIVE 12/05/2019 1347   LEUKOCYTESUR NEGATIVE 12/05/2019 1347   Sepsis Labs Invalid input(s): PROCALCITONIN,  WBC,  LACTICIDVEN Microbiology Recent Results (from the past 240 hour(s))  Respiratory Panel by RT PCR (Flu A&B, Covid) - Nasopharyngeal Swab     Status: None   Collection Time: 12/05/19  12:27 PM   Specimen: Nasopharyngeal Swab  Result Value Ref Range Status   SARS Coronavirus 2 by RT PCR NEGATIVE NEGATIVE Final    Comment: (NOTE) SARS-CoV-2 target nucleic acids are NOT DETECTED.  The SARS-CoV-2 RNA is generally detectable in upper respiratoy specimens during the acute phase of infection. The lowest concentration of SARS-CoV-2 viral copies this assay can detect is 131 copies/mL. A negative result does not preclude SARS-Cov-2 infection and should not be used as the sole basis for treatment or other patient management decisions. A negative result may occur with  improper specimen collection/handling, submission of specimen other than nasopharyngeal swab, presence of viral mutation(s) within the areas targeted by this assay, and inadequate number of viral copies (<131 copies/mL). A negative result must be combined with clinical observations, patient history, and epidemiological information. The expected result is Negative.  Fact Sheet for Patients:  PinkCheek.be  Fact Sheet for Healthcare Providers:  GravelBags.it  This test is no t yet approved or cleared by the [place] FDA and  has been authorized for detection and/or diagnosis of SARS-CoV-2 by FDA under an Emergency Use Authorization (EUA). This EUA will remain  in effect (meaning this test can be used) for the duration of the COVID-19 declaration under Section 564(b)(1) of the Act, 21 U.S.C. section 360bbb-3(b)(1), unless the authorization is terminated or revoked sooner.     Influenza A by PCR NEGATIVE NEGATIVE Final   Influenza B by PCR NEGATIVE NEGATIVE Final    Comment: (NOTE) The Xpert Xpress SARS-CoV-2/FLU/RSV assay is intended as an aid in  the diagnosis of influenza from Nasopharyngeal swab specimens and  should not be used as a sole basis for treatment. Nasal washings and  aspirates are unacceptable for Xpert Xpress SARS-CoV-2/FLU/RSV   testing.  Fact Sheet for Patients: PinkCheek.be  Fact Sheet for Healthcare Providers: GravelBags.it  This test is not yet approved or cleared by the [place] FDA and  has been authorized for detection and/or diagnosis of SARS-CoV-2 by  FDA under an Emergency Use Authorization (EUA). This EUA will remain  in effect (meaning this test can be used) for the duration of the  Covid-19 declaration under Section 564(b)(1) of the Act, 21  U.S.C. section 360bbb-3(b)(1), unless the authorization is  terminated or revoked. Performed at [facility], [address], [place], [place] [address]   Blood culture (routine x 2)     Status: None (Preliminary result)   Collection Time: 12/05/19 12:50 PM   Specimen: BLOOD  Result Value Ref Range Status   Specimen Description BLOOD LEFT ANTECUBITAL DRAWN BY RN  Final   Special Requests   Final    BOTTLES DRAWN AEROBIC AND ANAEROBIC Blood Culture adequate volume   Culture   Final    NO GROWTH 4 DAYS Performed at [facility], [address], [place], [place] [address]    Report Status PENDING  Incomplete  Blood culture (routine x 2)     Status: Abnormal   Collection Time: 12/05/19 12:53 PM   Specimen: BLOOD RIGHT FOREARM  Result Value Ref Range Status   Specimen Description   Final    BLOOD RIGHT FOREARM DRAWN BY RN Performed at [facility], [address], [place], [place] [address]    Special Requests   Final    BOTTLES DRAWN AEROBIC AND ANAEROBIC Blood Culture results may not be optimal due to an excessive volume of blood received in culture bottles Performed at [facility], [address] [place], [place] [address]    Culture  Setup Time   Final    GRAM NEGATIVE RODS CRITICAL RESULT CALLED TO, READ BACK BY AND VERIFIED WITH: SHANNON BROWN @1242  ON 10/12 BY TJ AEROBIC BOTTLE ONLY Performed at [facility], [place] [address], [place], [place] [address]    Culture KLEBSIELLA  OXYTOCA (A)  Final   Report Status 12/08/2019 FINAL  Final   Organism ID, Bacteria KLEBSIELLA OXYTOCA  Final      Susceptibility   Klebsiella oxytoca - MIC*    AMPICILLIN >=32 RESISTANT Resistant     CEFAZOLIN 16 SENSITIVE Sensitive     CEFEPIME <=0.12 SENSITIVE Sensitive     CEFTAZIDIME <=1 SENSITIVE Sensitive     CEFTRIAXONE <=0.25 SENSITIVE Sensitive     CIPROFLOXACIN 2 INTERMEDIATE Intermediate     GENTAMICIN <=1 SENSITIVE Sensitive     IMIPENEM <=0.25 SENSITIVE Sensitive     TRIMETH/SULFA <=20 SENSITIVE Sensitive     AMPICILLIN/SULBACTAM INTERMEDIATE Intermediate     PIP/TAZO 8 SENSITIVE Sensitive     * KLEBSIELLA OXYTOCA  Blood Culture ID Panel (Reflexed)     Status: Abnormal   Collection Time: 12/05/19 12:53 PM  Result Value Ref Range Status   Enterococcus faecalis NOT DETECTED NOT DETECTED Final   Enterococcus Faecium NOT DETECTED NOT DETECTED Final   Listeria monocytogenes NOT DETECTED NOT DETECTED Final   Staphylococcus species NOT DETECTED NOT DETECTED Final   Staphylococcus aureus (BCID) NOT DETECTED NOT DETECTED Final   Staphylococcus epidermidis NOT DETECTED NOT DETECTED Final   Staphylococcus lugdunensis NOT DETECTED NOT DETECTED Final   Streptococcus species NOT DETECTED NOT DETECTED Final   Streptococcus agalactiae NOT DETECTED NOT DETECTED Final   Streptococcus pneumoniae NOT DETECTED NOT DETECTED Final   Streptococcus pyogenes NOT DETECTED NOT DETECTED Final   A.calcoaceticus-baumannii NOT DETECTED NOT DETECTED Final   Bacteroides fragilis NOT DETECTED NOT DETECTED Final   Enterobacterales DETECTED (A) NOT DETECTED Final    Comment: Enterobacterales represent a large order of gram negative bacteria, not a single organism. CRITICAL RESULT CALLED TO, READ BACK BY AND VERIFIED WITH: T VOGLER RN 12/06/19 AT 9417 SK    Enterobacter cloacae complex NOT DETECTED NOT DETECTED Final   Escherichia coli NOT DETECTED NOT DETECTED Final   Klebsiella aerogenes NOT  DETECTED NOT DETECTED Final   Klebsiella oxytoca DETECTED (A) NOT DETECTED Final    Comment: CRITICAL RESULT CALLED TO, READ BACK BY AND VERIFIED WITH: T VOGLER RN 12/06/19 AT 1810 SK    Klebsiella pneumoniae NOT DETECTED NOT DETECTED Final   Proteus species NOT DETECTED NOT DETECTED Final   Salmonella species NOT DETECTED NOT DETECTED Final   Serratia marcescens NOT DETECTED NOT DETECTED Final   Haemophilus influenzae NOT DETECTED NOT DETECTED Final   Neisseria meningitidis NOT DETECTED NOT DETECTED Final   Pseudomonas aeruginosa  NOT DETECTED NOT DETECTED Final   Stenotrophomonas maltophilia NOT DETECTED NOT DETECTED Final   Candida albicans NOT DETECTED NOT DETECTED Final   Candida auris NOT DETECTED NOT DETECTED Final   Candida glabrata NOT DETECTED NOT DETECTED Final   Candida krusei NOT DETECTED NOT DETECTED Final   Candida parapsilosis NOT DETECTED NOT DETECTED Final   Candida tropicalis NOT DETECTED NOT DETECTED Final   Cryptococcus neoformans/gattii NOT DETECTED NOT DETECTED Final   CTX-M ESBL NOT DETECTED NOT DETECTED Final   Carbapenem resistance IMP NOT DETECTED NOT DETECTED Final   Carbapenem resistance KPC NOT DETECTED NOT DETECTED Final   Carbapenem resistance NDM NOT DETECTED NOT DETECTED Final   Carbapenem resist OXA 48 LIKE NOT DETECTED NOT DETECTED Final   Carbapenem resistance VIM NOT DETECTED NOT DETECTED Final    Comment: Performed at [facility], [address], [place], [place] [address]  Urine culture     Status: Abnormal   Collection Time: 12/05/19  1:48 PM   Specimen: Urine, Random  Result Value Ref Range Status   Specimen Description   Final    URINE, RANDOM Performed at [facility], [address] [place], [place] [address]    Special Requests   Final    NONE Performed at [facility], [address], [place], [place] [address]    Culture (A)  Final    <10,000 COLONIES/mL INSIGNIFICANT GROWTH Performed at [facility], [place] [address], [place], [place] [address]    Report Status 12/06/2019 FINAL  Final  MRSA PCR Screening     Status: None   Collection Time: 12/05/19  3:16 PM   Specimen: Nasal Mucosa; Nasopharyngeal  Result Value Ref Range Status   MRSA by PCR NEGATIVE NEGATIVE Final    Comment:        The GeneXpert MRSA Assay (FDA approved for NASAL specimens only), is one component of a comprehensive MRSA colonization surveillance program. It is not intended to diagnose MRSA infection nor to guide or monitor treatment for MRSA infections. Performed at [facility], [address] [place], [place] [address]      Time coordinating discharge: 12mins  SIGNED:   Kathie Dike, MD  [facility] 12/09/2019, 9:07 PM   If 7PM-7AM, please contact night-coverage [email or web address]

## 2019-12-09 NOTE — Evaluation (Signed)
Physical Therapy Evaluation Patient Details Name: Andrew Humphrey MRN: [id number] DOB: 07/02/1940 Today's Date: 12/09/2019   History of Present Illness  SIMONE TUCKEY is a 79 y.o. male with medical history of atrial fibrillation, hypertension, diabetes mellitus type 2, GI bleed presenting with generalized weakness and fevers and chills.  The patient was initially seen in the emergency department on 12/02/2019 with left upper quadrant abdominal pain.  CT of the abdomen and pelvis on 1021 showed mild pericholecystic inflammatory changes toward the gallbladder neck and moderate gallbladder distention.  There was also some segmental thickening of the rectosigmoid colon with minimal pericolonic haze.  Patient was treated symptomatically and discharged home in stable condition.  On 12/03/2019 and 12/04/2019, the patient states that he was feeling well.  He had denied any fevers, chills, chest pain or shortness breath, coughing, hemoptysis, nausea, vomiting, diarrhea.  He states that his abdominal pain had actually improved.  The patient's significant other is also helping with the history.  Apparently, his significant other stated that he began having some mild abdominal cramping again on the evening of 12/04/2019 although the patient denies this.  The patient was eating fine over the weekend.  He denied headache or neck pain.  He does denies any new medications.On the morning of 12/05/2019, his significant other came over to check up on him and found him on the floor lethargic.  EMS was activated.  Apparently, the patient had bowel incontinence and emesis on him.In the emergency department, the patient is alert and oriented x4.  He actually denies any complaints presently including nausea, vomiting, diarrhea, domino pain, dysuria, hematuria.  He did remember being incontinent of stool, but stated that he was too weak to get up.  He denies any alcohol or illegal drug use.  He does drink alcohol rarely.     Clinical Impression  Patient functioning near baseline for functional mobility and gait, demonstrates good return for ambulation in room and hallways without loss of balance.  Plan:  Patient discharged from physical therapy to care of nursing for ambulation daily as tolerated for length of stay.     Follow Up Recommendations No PT follow up;Supervision - Intermittent    Equipment Recommendations  None recommended by PT    Recommendations for Other Services       Precautions / Restrictions Precautions Precautions: None Restrictions Weight Bearing Restrictions: No      Mobility  Bed Mobility Overal bed mobility: Modified Independent                Transfers Overall transfer level: Modified independent                  Ambulation/Gait Ambulation/Gait assistance: Modified independent (Device/Increase time) Gait Distance (Feet): 100 Feet Assistive device: None Gait Pattern/deviations: WFL(Within Functional Limits) Gait velocity: decreased   General Gait Details: grossly WFL, slightly unsteady without loss of balance, limited secondary to fatigue  Stairs            Wheelchair Mobility    Modified Rankin (Stroke Patients Only)       Balance Overall balance assessment: No apparent balance deficits (not formally assessed)                                           Pertinent Vitals/Pain Pain Assessment: No/denies pain    Home Living Family/patient expects to be discharged to:: Private  residence Living Arrangements: Alone Available Help at Discharge: Family;Friend(s);Available PRN/intermittently Type of Home: House Home Access: Level entry     Home Layout: One level Home Equipment: Cane - single point;Crutches      Prior Function Level of Independence: Independent         Comments: Hydrographic surveyor, drives     Journalist, newspaper        Extremity/Trunk Assessment   Upper Extremity Assessment Upper Extremity  Assessment: Overall WFL for tasks assessed    Lower Extremity Assessment Lower Extremity Assessment: Overall WFL for tasks assessed       Communication   Communication: No difficulties  Cognition Arousal/Alertness: Awake/alert Behavior During Therapy: WFL for tasks assessed/performed Overall Cognitive Status: Within Functional Limits for tasks assessed                                        General Comments      Exercises     Assessment/Plan    PT Assessment Patent does not need any further PT services  PT Problem List         PT Treatment Interventions      PT Goals (Current goals can be found in the Care Plan section)  Acute Rehab PT Goals Patient Stated Goal: return home with family/friends to assist PT Goal Formulation: With patient Time For Goal Achievement: 12/09/19 Potential to Achieve Goals: Good    Frequency     Barriers to discharge        Co-evaluation               AM-PAC PT "6 Clicks" Mobility  Outcome Measure Help needed turning from your back to your side while in a flat bed without using bedrails?: None Help needed moving from lying on your back to sitting on the side of a flat bed without using bedrails?: None Help needed moving to and from a bed to a chair (including a wheelchair)?: None Help needed standing up from a chair using your arms (e.g., wheelchair or bedside chair)?: None Help needed to walk in hospital room?: None Help needed climbing 3-5 steps with a railing? : None 6 Click Score: 24    End of Session   Activity Tolerance: Patient tolerated treatment well;Patient limited by fatigue Patient left: in bed;with call bell/phone within reach;with family/visitor present Nurse Communication: Mobility status PT Visit Diagnosis: Unsteadiness on feet (R26.81);Other abnormalities of gait and mobility (R26.89);Muscle weakness (generalized) (M62.81)    Time: 3664-4034 PT Time Calculation (min) (ACUTE ONLY): 15  min   Charges:   PT Evaluation $PT Eval Low Complexity: 1 Low PT Treatments $Therapeutic Activity: 8-22 mins        9:25 AM, 12/09/19 Lonell Grandchild, MPT Physical Therapist with [facility] [phone] office [phone] mobile phone

## 2019-12-10 LAB — CULTURE, BLOOD (ROUTINE X 2)
Culture: NO GROWTH
Special Requests: ADEQUATE

## 2019-12-15 ENCOUNTER — Other Ambulatory Visit: Payer: Self-pay

## 2019-12-15 NOTE — Patient Outreach (Signed)
[place] [facility]) Care Management  12/15/2019  RIMAS GILHAM [birth year] [id number]    EMMI-General Discharge RED ON EMMI ALERT Day # 4 Date: 12/14/19 Red Alert Reason: " Got discharge papers? I don't know  Know who to call about changes in condition? No  New prescriptions? I don't know   Sad/hopeless/anxious/empty? Yes"     Outreach attempt # 1 to patient. No answer after multiple rings and unable to leave voicemail message.     Plan: RN CM will make outreach attempt to patient within 3-4 business days. RN CM will send unsuccessful outreach letter to patient.  Enzo Montgomery, RN,BSN,CCM [place] Management Telephonic Care Management Coordinator Direct Phone: [phone] Toll Free: [phone] Fax: [phone]

## 2019-12-16 ENCOUNTER — Other Ambulatory Visit: Payer: Self-pay

## 2019-12-16 NOTE — Patient Outreach (Signed)
[facility]) Care Management  12/16/2019  Andrew Humphrey Sep 17, 1940 [id number]   EMMI-General Discharge RED ON EMMI ALERT Day # 4 Date: 12/14/19 Red Alert Reason: " Got discharge papers? I don't know  Know who to call about changes in condition? No  New prescriptions? I don't know   Sad/hopeless/anxious/empty? Yes"    Outreach attempt #2 to patient. No answer at present and unable to leave message.     Plan:  RN CM will make outreach attempt to patient within 3-4 business days.  Enzo Montgomery, RN,BSN,CCM [place] Management Telephonic Care Management Coordinator Direct Phone: [phone] Toll Free: [phone] Fax: [phone]

## 2019-12-19 ENCOUNTER — Encounter: Payer: Self-pay | Admitting: Cardiology

## 2019-12-19 ENCOUNTER — Other Ambulatory Visit: Payer: Self-pay

## 2019-12-19 ENCOUNTER — Ambulatory Visit: Payer: Medicare HMO | Admitting: Cardiology

## 2019-12-19 ENCOUNTER — Other Ambulatory Visit (HOSPITAL_COMMUNITY)
Admission: RE | Admit: 2019-12-19 | Discharge: 2019-12-19 | Disposition: A | Discharge status: Home / Self Care | Payer: Medicare HMO | Source: Ambulatory Visit | Attending: Cardiology | Admitting: Cardiology

## 2019-12-19 VITALS — BP 124/80 | HR 60 | Ht 67.0 in | Wt 182.2 lb

## 2019-12-19 DIAGNOSIS — E119 Type 2 diabetes mellitus without complications: Secondary | ICD-10-CM

## 2019-12-19 DIAGNOSIS — A4159 Other Gram-negative sepsis: Secondary | ICD-10-CM

## 2019-12-19 DIAGNOSIS — Z8719 Personal history of other diseases of the digestive system: Secondary | ICD-10-CM | POA: Diagnosis not present

## 2019-12-19 DIAGNOSIS — I10 Essential (primary) hypertension: Secondary | ICD-10-CM

## 2019-12-19 DIAGNOSIS — I4891 Unspecified atrial fibrillation: Secondary | ICD-10-CM

## 2019-12-19 LAB — BASIC METABOLIC PANEL
Anion gap: 11 (ref 5–15)
BUN: 12 mg/dL (ref 8–23)
CO2: 21 mmol/L — ABNORMAL LOW (ref 22–32)
Calcium: 9.5 mg/dL (ref 8.9–10.3)
Chloride: 100 mmol/L (ref 98–111)
Creatinine, Ser: 0.73 mg/dL (ref 0.61–1.24)
GFR, Estimated: >60 mL/min (ref >60)
Glucose, Bld: 104 mg/dL — ABNORMAL HIGH (ref 70–99)
Potassium: 4.3 mmol/L (ref 3.5–5.1)
Sodium: 132 mmol/L — ABNORMAL LOW (ref 135–145)

## 2019-12-19 LAB — CBC
HCT: 39.1 % (ref 39.0–52.0)
Hemoglobin: 13 g/dL (ref 13.0–17.0)
MCH: 32.6 pg (ref 26.0–34.0)
MCHC: 33.2 g/dL (ref 30.0–36.0)
MCV: 98 fL (ref 80.0–100.0)
Platelets: 282 10*3/uL (ref 150–400)
RBC: 3.99 MIL/uL — ABNORMAL LOW (ref 4.22–5.81)
RDW: 20.9 % — ABNORMAL HIGH (ref 11.5–15.5)
WBC: 10.1 10*3/uL (ref 4.0–10.5)
nRBC: 0 % (ref 0.0–0.2)

## 2019-12-19 LAB — TSH: TSH: 7.737 u[IU]/mL — ABNORMAL HIGH (ref 0.350–4.500)

## 2019-12-19 MED ORDER — METOPROLOL SUCCINATE ER 50 MG PO TB24: 50.0000 mg | ORAL_TABLET | Freq: Every day | ORAL | 3 refills | Status: DC

## 2019-12-19 MED ORDER — AMIODARONE HCL 200 MG PO TABS: 200.0000 mg | ORAL_TABLET | Freq: Every day | ORAL | 3 refills | Status: DC

## 2019-12-19 NOTE — Assessment & Plan Note (Addendum)
Reportedly diverticular bleeding 2019

## 2019-12-19 NOTE — Progress Notes (Signed)
Cardiology Office Note:    Date:  12/19/2019   ID:  LEXANDER TREMBLAY, DOB 05-20-1940, MRN [id number]  PCP:  Lanelle Bal, PA-C  Cardiologist:  Dr Domenic Polite Electrophysiologist:  None   Referring MD: Lanelle Bal, PA-C   No chief complaint on file.   History of Present Illness:    Andrew Humphrey is a 79 y.o. male with a hx of PAF in 2019.  At that time he converted with rate control in the emergency room and was discharged home.  He was placed on Eliquis.  He never did follow-up with cardiology.  At some point apparently the Eliquis was stopped.  He has had a history of GI bleeding in the past.  He was recently admitted 12/05/2019 with sepsis.  It was felt that he had cholecystitis with Klebsiella sepsis.  During his hospitalization he again had PAF.  Cardiology was consulted in the hospital this time.  He was placed on Eliquis and amiodarone.  He was in sinus rhythm at discharge.  Presents to the office today for follow-up.  As far as he can tell his heart rhythm has been regular.  His main complaint is weakness.  In the office today he is in sinus rhythm on exam with a heart rate of 55.  He is not had any bleeding that he can tell.  He has been compliant with his medications.  Past Medical History:  Diagnosis Date  . Anemia   . Essential hypertension   . History of atrial fibrillation   . History of GI bleed   . Type 2 diabetes mellitus ([place])     Past Surgical History:  Procedure Laterality Date  . COLONOSCOPY N/A 11/09/2016   Procedure: COLONOSCOPY;  Surgeon: Danie Binder, MD;  Location: [facility];  Service: Endoscopy;  Laterality: N/A;  . [place]  . TONSILLECTOMY      Current Medications: Current Meds  Medication Sig  . amiodarone (PACERONE) 200 MG tablet Take 1 tablet (200 mg total) by mouth daily.  Marland Kitchen apixaban (ELIQUIS) 5 MG TABS tablet Take 1 tablet (5 mg total) by mouth 2 (two) times daily.  Marland Kitchen glipiZIDE (GLUCOTROL XL) 10 MG 24 hr tablet  Take 20 mg by mouth daily.  . metFORMIN (GLUCOPHAGE) 1000 MG tablet Take 1,000 mg by mouth in the morning and at bedtime.   Marland Kitchen omeprazole (PRILOSEC) 40 MG capsule Take 40 mg by mouth daily as needed (indigestion).   . [DISCONTINUED] amiodarone (PACERONE) 200 MG tablet Take 2 tablets (400 mg total) by mouth 2 (two) times daily.  . [DISCONTINUED] aspirin EC 81 MG tablet Take 1 tablet (81 mg total) by mouth every morning.  . [DISCONTINUED] metoprolol succinate (TOPROL-XL) 100 MG 24 hr tablet Take 100 mg by mouth every morning.     Allergies:   Patient has no known allergies.   Social History   Socioeconomic History  . Marital status: Single    Spouse name: Not on file  . Number of children: Not on file  . Years of education: Not on file  . Highest education level: Not on file  Occupational History  . Not on file  Tobacco Use  . Smoking status: Never Smoker  . Smokeless tobacco: Never Used  Substance and Sexual Activity  . Alcohol use: Yes    Comment: occasional beer  . Drug use: No  . Sexual activity: Not on file  Other Topics Concern  . Not on file  Social History Narrative  . Not on file   Social Determinants of Health   Financial Resource Strain:   . Difficulty of Paying Living Expenses: Not on file  Food Insecurity:   . Worried About Charity fundraiser in the Last Year: Not on file  . Ran Out of Food in the Last Year: Not on file  Transportation Needs:   . Lack of Transportation (Medical): Not on file  . Lack of Transportation (Non-Medical): Not on file  Physical Activity:   . Days of Exercise per Week: Not on file  . Minutes of Exercise per Session: Not on file  Stress:   . Feeling of Stress : Not on file  Social Connections:   . Frequency of Communication with Friends and Family: Not on file  . Frequency of Social Gatherings with Friends and Family: Not on file  . Attends Religious Services: Not on file  . Active Member of Clubs or Organizations: Not on file  .  Attends Archivist Meetings: Not on file  . Marital Status: Not on file     Family History: The patient's family history includes Hypertension in his father. There is no history of Colon cancer or Colon polyps.  ROS:   Please see the history of present illness.     All other systems reviewed and are negative.  EKGs/Labs/Other Studies Reviewed:    The following studies were reviewed today: Echo 12/09/2019- IMPRESSIONS    1. MIld distal septal hypokinesis. . Left ventricular ejection fraction,  by estimation, is 55 to 60%. The left ventricle has normal function. The  left ventricle demonstrates regional wall motion abnormalities (see  scoring diagram/findings for  description). There is mild left ventricular hypertrophy. Left ventricular  diastolic parameters were normal.  2. Right ventricular systolic function is mildly reduced. The right  ventricular size is normal. There is mildly elevated pulmonary artery  systolic pressure.  3. Left atrial size was mild to moderately dilated.  4. The mitral valve is abnormal. Moderate mitral valve regurgitation.  5. The aortic valve is abnormal. Aortic valve regurgitation is trivial.  Mild to moderate aortic valve sclerosis/calcification is present, without  any evidence of aortic stenosis.  6. The inferior vena cava is dilated in size with <50% respiratory  variability, suggesting right atrial pressure of 15 mmHg.    EKG:  EKG is not ordered today.  The ekg ordered 12/09/2019 demonstrates NSR, HR 68, LAD, QTc 458  Recent Labs: 12/08/2019: ALT 30; Magnesium 1.7 12/09/2019: BUN 17; Creatinine, Ser 0.65; Hemoglobin 10.1; Platelets 116; Potassium 3.6; Sodium 134  Recent Lipid Panel No results found for: CHOL, TRIG, HDL, CHOLHDL, VLDL, LDLCALC, LDLDIRECT  Physical Exam:    VS:  BP 124/80   Pulse 60   Ht 5\' 7"  (1.702 m)   Wt 182 lb 3.2 oz (82.6 kg)   BMI 28.54 kg/m     Wt Readings from Last 3 Encounters:  12/19/19  182 lb 3.2 oz (82.6 kg)  12/09/19 202 lb 13.2 oz (92 kg)  12/02/19 193 lb (87.5 kg)     GEN:  Well nourished, well developed in no acute distress HEENT: Normal NECK: No JVD; No carotid bruits CARDIAC: RRR, no murmurs, rubs, gallops RESPIRATORY:  Clear to auscultation without rales, wheezing or rhonchi  ABDOMEN: Soft, non-tender, non-distended MUSCULOSKELETAL:  No edema; No deformity  SKIN: Warm and dry NEUROLOGIC:  Alert and oriented x 3 PSYCHIATRIC:  Normal affect   ASSESSMENT:    Atrial fibrillation  with RVR ([place]) Pt had brief PAF in 2019.  He did not f/u with cardiology then but was on Eliquis for a period of time but later stopped by PCP. Admitted Oct 2021 with Klebsiella sepsis secondary cholecystis and again had PAF.  He was treated with Amio and placed on Eliquis..   Sepsis due to Klebsiella [facility]) Discharged 12/09/2019  History of GI bleed Reportedly diverticular bleeding 2019  Diabetes mellitus without complication (HCC) On Metformin and Glipizide  PLAN:    Decrease Amiodarone to 200 mg daily, stop aspirin 81 mg, decrease Toprol to 50 mg daily, check CBC, BMP, TSH. F/U 6-8 weeks.    Medication Adjustments/Labs and Tests Ordered: Current medicines are reviewed at length with the patient today.  Concerns regarding medicines are outlined above.  Orders Placed This Encounter  Procedures  . CBC  . Basic metabolic panel  . TSH   Meds ordered this encounter  Medications  . amiodarone (PACERONE) 200 MG tablet    Sig: Take 1 tablet (200 mg total) by mouth daily.    Dispense:  90 tablet    Refill:  3  . metoprolol succinate (TOPROL XL) 50 MG 24 hr tablet    Sig: Take 1 tablet (50 mg total) by mouth daily. Take with or immediately following a meal.    Dispense:  90 tablet    Refill:  3    Patient Instructions  Medication Instructions:  Your physician has recommended you make the following change in your medication:   1) Stop Aspirin 81 mg 2) Decrease  Amiodarone to 200 mg, 1 tablet by mouth once a day 3) Decrease Metoprolol to 50 mg, 1 tablet by mouth once a day  *If you need a refill on your cardiac medications before your next appointment, please call your pharmacy*  Lab Work: You will have labs drawn today: TSH, BMET, CBC  If you have labs (blood work) drawn today and your tests are completely normal, you will receive your results only by: Marland Kitchen MyChart Message (if you have MyChart) OR . A paper copy in the mail If you have any lab test that is abnormal or we need to change your treatment, we will call you to review the results.  Testing/Procedures: None ordered today  Follow-Up: At [facility], you and your health needs are our priority.  As part of our continuing mission to provide you with exceptional heart care, we have created designated Provider Care Teams.  These Care Teams include your primary Cardiologist (physician) and Advanced Practice Providers (APPs -  Physician Assistants and Nurse Practitioners) who all work together to provide you with the care you need, when you need it.  Your next appointment:   6-8 week(s)  The format for your next appointment:   In Person  Provider:   You may see Carlyle Dolly, MD or one of the following Advanced Practice Providers on your designated Care Team:    Bernerd Pho, PA-C   Ermalinda Barrios, PA-C      Signed, Kerin Ransom, [place]  12/19/2019 2:52 PM    [place]

## 2019-12-19 NOTE — Patient Instructions (Addendum)
Medication Instructions:  Your physician has recommended you make the following change in your medication:   1) Stop Aspirin 81 mg 2) Decrease Amiodarone to 200 mg, 1 tablet by mouth once a day 3) Decrease Metoprolol to 50 mg, 1 tablet by mouth once a day  *If you need a refill on your cardiac medications before your next appointment, please call your pharmacy*  Lab Work: You will have labs drawn today: TSH, BMET, CBC  If you have labs (blood work) drawn today and your tests are completely normal, you will receive your results only by: Marland Kitchen MyChart Message (if you have MyChart) OR . A paper copy in the mail If you have any lab test that is abnormal or we need to change your treatment, we will call you to review the results.  Testing/Procedures: None ordered today  Follow-Up: At [facility], you and your health needs are our priority.  As part of our continuing mission to provide you with exceptional heart care, we have created designated Provider Care Teams.  These Care Teams include your primary Cardiologist (physician) and Advanced Practice Providers (APPs -  Physician Assistants and Nurse Practitioners) who all work together to provide you with the care you need, when you need it.  Your next appointment:   6-8 week(s)  The format for your next appointment:   In Person  Provider:   You may see Rozann Lesches, MD or one of the following Advanced Practice Providers on your designated Care Team:    [place], PA-C   Ermalinda Barrios, [place]

## 2019-12-19 NOTE — Patient Outreach (Signed)
[place] [facility]) Care Management  12/19/2019  OTNIEL HOE Sep 03, 1940 [id number]   EMMI-General Discharge RED ON EMMI ALERT Day # 4 Date: 12/14/19 Red Alert Reason: " Got discharge papers? I don't know  Know who to call about changes in condition? No  New prescriptions? I don't know   Sad/hopeless/anxious/empty? Yes"    Outreach attempt # 3 to patient. NO answer at present and unable to leave message.     Plan: RN CM will make outreach attempt to patient within 3-4 wks if no response from letter mailed to patient.  Enzo Montgomery, RN,BSN,CCM [place] Management Telephonic Care Management Coordinator Direct Phone: [phone] Toll Free: [phone] Fax: [phone]

## 2019-12-19 NOTE — Assessment & Plan Note (Signed)
On Metformin and Glipizide

## 2019-12-19 NOTE — Assessment & Plan Note (Signed)
Discharged 12/09/2019

## 2019-12-19 NOTE — Assessment & Plan Note (Signed)
Pt had brief PAF in 2019.  He did not f/u with cardiology then but was on Eliquis for a period of time but later stopped by PCP. Admitted Oct 2021 with Klebsiella sepsis secondary cholecystis and again had PAF.  He was treated with Amio and placed on Eliquis.Marland Kitchen

## 2019-12-22 ENCOUNTER — Telehealth: Payer: Self-pay | Admitting: *Deleted

## 2019-12-22 DIAGNOSIS — I10 Essential (primary) hypertension: Secondary | ICD-10-CM

## 2019-12-22 DIAGNOSIS — I4891 Unspecified atrial fibrillation: Secondary | ICD-10-CM

## 2019-12-22 NOTE — Telephone Encounter (Signed)
Spoke with pt about his blood work, result send to pt PCP Dr Kayleen Memos, lab slip mail to pt to get blood work done in 4 weeks

## 2019-12-22 NOTE — Telephone Encounter (Signed)
-----   Message from Erlene Quan, [place] sent at 12/20/2019  8:32 AM EDT ----- Please let the patient know I reviewed his labs- no change in Rx.  He should have a repeat TSH with free T4 in 4 weeks.  Kerin Ransom PA-C 12/20/2019 8:32 AM

## 2019-12-28 ENCOUNTER — Other Ambulatory Visit: Payer: Self-pay | Admitting: *Deleted

## 2019-12-28 MED ORDER — APIXABAN 5 MG PO TABS: 5.0000 mg | ORAL_TABLET | Freq: Two times a day (BID) | ORAL | 11 refills | Status: DC

## 2020-01-01 ENCOUNTER — Other Ambulatory Visit: Payer: Self-pay

## 2020-01-01 ENCOUNTER — Inpatient Hospital Stay (HOSPITAL_COMMUNITY)
Admission: AD | Admit: 2020-01-01 | Discharge: 2020-01-08 | DRG: 417 | Disposition: A | Discharge status: Home / Self Care | Payer: Medicare HMO | Source: Other Acute Inpatient Hospital | Attending: Internal Medicine | Admitting: Internal Medicine

## 2020-01-01 ENCOUNTER — Encounter (HOSPITAL_COMMUNITY)
Admission: AD | Disposition: A | Discharge status: Home / Self Care | Payer: Self-pay | Source: Other Acute Inpatient Hospital | Attending: Internal Medicine

## 2020-01-01 DIAGNOSIS — R9431 Abnormal electrocardiogram [ECG] [EKG]: Secondary | ICD-10-CM | POA: Diagnosis not present

## 2020-01-01 DIAGNOSIS — I213 ST elevation (STEMI) myocardial infarction of unspecified site: Secondary | ICD-10-CM | POA: Diagnosis not present

## 2020-01-01 DIAGNOSIS — I4891 Unspecified atrial fibrillation: Secondary | ICD-10-CM | POA: Diagnosis not present

## 2020-01-01 DIAGNOSIS — Z7984 Long term (current) use of oral hypoglycemic drugs: Secondary | ICD-10-CM | POA: Diagnosis not present

## 2020-01-01 DIAGNOSIS — K812 Acute cholecystitis with chronic cholecystitis: Secondary | ICD-10-CM | POA: Diagnosis not present

## 2020-01-01 DIAGNOSIS — R1084 Generalized abdominal pain: Secondary | ICD-10-CM | POA: Diagnosis not present

## 2020-01-01 DIAGNOSIS — E119 Type 2 diabetes mellitus without complications: Secondary | ICD-10-CM

## 2020-01-01 DIAGNOSIS — R112 Nausea with vomiting, unspecified: Secondary | ICD-10-CM | POA: Diagnosis not present

## 2020-01-01 DIAGNOSIS — T8110XA Postprocedural shock unspecified, initial encounter: Secondary | ICD-10-CM | POA: Diagnosis not present

## 2020-01-01 DIAGNOSIS — K851 Biliary acute pancreatitis without necrosis or infection: Secondary | ICD-10-CM | POA: Diagnosis present

## 2020-01-01 DIAGNOSIS — Z7901 Long term (current) use of anticoagulants: Secondary | ICD-10-CM | POA: Diagnosis not present

## 2020-01-01 DIAGNOSIS — E785 Hyperlipidemia, unspecified: Secondary | ICD-10-CM | POA: Diagnosis present

## 2020-01-01 DIAGNOSIS — I9581 Postprocedural hypotension: Secondary | ICD-10-CM | POA: Diagnosis not present

## 2020-01-01 DIAGNOSIS — I10 Essential (primary) hypertension: Secondary | ICD-10-CM | POA: Diagnosis not present

## 2020-01-01 DIAGNOSIS — Z20822 Contact with and (suspected) exposure to covid-19: Secondary | ICD-10-CM | POA: Diagnosis not present

## 2020-01-01 DIAGNOSIS — D62 Acute posthemorrhagic anemia: Secondary | ICD-10-CM | POA: Diagnosis not present

## 2020-01-01 DIAGNOSIS — K828 Other specified diseases of gallbladder: Secondary | ICD-10-CM | POA: Diagnosis present

## 2020-01-01 DIAGNOSIS — E1165 Type 2 diabetes mellitus with hyperglycemia: Secondary | ICD-10-CM | POA: Diagnosis present

## 2020-01-01 DIAGNOSIS — I480 Paroxysmal atrial fibrillation: Secondary | ICD-10-CM | POA: Diagnosis present

## 2020-01-01 DIAGNOSIS — K819 Cholecystitis, unspecified: Secondary | ICD-10-CM | POA: Diagnosis not present

## 2020-01-01 DIAGNOSIS — K810 Acute cholecystitis: Secondary | ICD-10-CM | POA: Diagnosis not present

## 2020-01-01 DIAGNOSIS — K859 Acute pancreatitis without necrosis or infection, unspecified: Secondary | ICD-10-CM

## 2020-01-01 DIAGNOSIS — I499 Cardiac arrhythmia, unspecified: Secondary | ICD-10-CM | POA: Diagnosis not present

## 2020-01-01 DIAGNOSIS — R001 Bradycardia, unspecified: Secondary | ICD-10-CM | POA: Diagnosis not present

## 2020-01-01 DIAGNOSIS — R109 Unspecified abdominal pain: Secondary | ICD-10-CM

## 2020-01-01 DIAGNOSIS — R0789 Other chest pain: Secondary | ICD-10-CM | POA: Diagnosis not present

## 2020-01-01 DIAGNOSIS — R6510 Systemic inflammatory response syndrome (SIRS) of non-infectious origin without acute organ dysfunction: Secondary | ICD-10-CM | POA: Diagnosis not present

## 2020-01-01 DIAGNOSIS — Z79899 Other long term (current) drug therapy: Secondary | ICD-10-CM

## 2020-01-01 DIAGNOSIS — Z8249 Family history of ischemic heart disease and other diseases of the circulatory system: Secondary | ICD-10-CM | POA: Diagnosis not present

## 2020-01-01 DIAGNOSIS — Z8619 Personal history of other infectious and parasitic diseases: Secondary | ICD-10-CM

## 2020-01-01 DIAGNOSIS — R079 Chest pain, unspecified: Secondary | ICD-10-CM | POA: Diagnosis not present

## 2020-01-01 DIAGNOSIS — J811 Chronic pulmonary edema: Secondary | ICD-10-CM | POA: Diagnosis not present

## 2020-01-01 DIAGNOSIS — Z01810 Encounter for preprocedural cardiovascular examination: Secondary | ICD-10-CM | POA: Diagnosis not present

## 2020-01-01 DIAGNOSIS — K8042 Calculus of bile duct with acute cholecystitis without obstruction: Secondary | ICD-10-CM | POA: Diagnosis not present

## 2020-01-01 DIAGNOSIS — I440 Atrioventricular block, first degree: Secondary | ICD-10-CM | POA: Diagnosis not present

## 2020-01-01 DIAGNOSIS — E860 Dehydration: Secondary | ICD-10-CM | POA: Diagnosis not present

## 2020-01-01 LAB — CBC
HCT: 34.8 % — ABNORMAL LOW (ref 39.0–52.0)
Hemoglobin: 11.5 g/dL — ABNORMAL LOW (ref 13.0–17.0)
MCH: 32.3 pg (ref 26.0–34.0)
MCHC: 33 g/dL (ref 30.0–36.0)
MCV: 97.8 fL (ref 80.0–100.0)
Platelets: 157 10*3/uL (ref 150–400)
RBC: 3.56 MIL/uL — ABNORMAL LOW (ref 4.22–5.81)
RDW: 20 % — ABNORMAL HIGH (ref 11.5–15.5)
WBC: 16.8 10*3/uL — ABNORMAL HIGH (ref 4.0–10.5)
nRBC: 0 % (ref 0.0–0.2)

## 2020-01-01 SURGERY — CORONARY/GRAFT ACUTE MI REVASCULARIZATION
Anesthesia: LOCAL

## 2020-01-01 NOTE — Progress Notes (Signed)
   01/01/20 2300  Clinical Encounter Type  Visited With Patient not available  Visit Type Trauma  Referral From Nurse  Consult/Referral To Chaplain  The chaplain responded to code stemi. The patient will be taken to the Cath Lab. No family present. No chaplain services needed at this time. The chaplain will follow up as needed.

## 2020-01-01 NOTE — ED Triage Notes (Signed)
Pt presents to ED as STEMI. Pt c/o CP and abd pain. Transferred from [facility]. Given 4mg  morphine, 4mg  zofran, 324mg  ASA, 4000u heparin.  168/78 HR - 66 99% - 2L

## 2020-01-01 NOTE — Progress Notes (Signed)
Interventional Cardiology Note: Pt seen with Dr Kalman Shan, cardiology fellow, on arrival to the [facility] ED in transfer from [facility]. He arrives as a Code STEMI patient after his initial EKG showed lateral ST elevation with reciprocal ST depression, suggestive of STEMI.  On arrival here, the patient complains of epigastric discomfort. He does not have chest pain. He is well-appearing with normal BP and pulse rate. On exam, his abdomen is tender to palpation. Labs are notable for an elevated WBC, elevated lipase, and normal troponin. Troponin was pending when we were called initially. EKG is repeated and shows normal sinus rhythm with resolution of ST segment changes. EKG no longer diagnostic of STEMI. Bedside limited echo performed and shows normal LV function with no regional wall motion abnormalities.  Overall clinical presentation seems more consistent with cholecystitis or other GI source of symptoms. The patient recently was hospitalized with cholecystitis and Klebsiella sepsis. He was managed conservatively with improvement in symptoms. Those records are reviewed this evening.  Further evaluation per ER team. Dr Dayna Barker at bedside with me at time of initial presentation here. Cardiology team available if any issues arise. Would order repeat troponin as part of his evaluation. Code STEMI cancelled.   Sherren Mocha 01/01/2020 11:40 PM

## 2020-01-02 ENCOUNTER — Encounter (HOSPITAL_COMMUNITY): Payer: Self-pay | Admitting: Family Medicine

## 2020-01-02 ENCOUNTER — Emergency Department (HOSPITAL_COMMUNITY): Payer: Medicare HMO

## 2020-01-02 DIAGNOSIS — I9581 Postprocedural hypotension: Secondary | ICD-10-CM | POA: Diagnosis not present

## 2020-01-02 DIAGNOSIS — K828 Other specified diseases of gallbladder: Secondary | ICD-10-CM | POA: Diagnosis not present

## 2020-01-02 DIAGNOSIS — Z01810 Encounter for preprocedural cardiovascular examination: Secondary | ICD-10-CM

## 2020-01-02 DIAGNOSIS — Z7901 Long term (current) use of anticoagulants: Secondary | ICD-10-CM | POA: Diagnosis not present

## 2020-01-02 DIAGNOSIS — K819 Cholecystitis, unspecified: Secondary | ICD-10-CM | POA: Diagnosis not present

## 2020-01-02 DIAGNOSIS — R6510 Systemic inflammatory response syndrome (SIRS) of non-infectious origin without acute organ dysfunction: Secondary | ICD-10-CM | POA: Diagnosis present

## 2020-01-02 DIAGNOSIS — Z8249 Family history of ischemic heart disease and other diseases of the circulatory system: Secondary | ICD-10-CM | POA: Diagnosis not present

## 2020-01-02 DIAGNOSIS — I4891 Unspecified atrial fibrillation: Secondary | ICD-10-CM | POA: Diagnosis not present

## 2020-01-02 DIAGNOSIS — K851 Biliary acute pancreatitis without necrosis or infection: Secondary | ICD-10-CM | POA: Diagnosis present

## 2020-01-02 DIAGNOSIS — R109 Unspecified abdominal pain: Secondary | ICD-10-CM | POA: Diagnosis not present

## 2020-01-02 DIAGNOSIS — K812 Acute cholecystitis with chronic cholecystitis: Secondary | ICD-10-CM | POA: Diagnosis present

## 2020-01-02 DIAGNOSIS — I480 Paroxysmal atrial fibrillation: Secondary | ICD-10-CM | POA: Diagnosis present

## 2020-01-02 DIAGNOSIS — E119 Type 2 diabetes mellitus without complications: Secondary | ICD-10-CM

## 2020-01-02 DIAGNOSIS — Z8619 Personal history of other infectious and parasitic diseases: Secondary | ICD-10-CM | POA: Diagnosis not present

## 2020-01-02 DIAGNOSIS — Z7984 Long term (current) use of oral hypoglycemic drugs: Secondary | ICD-10-CM | POA: Diagnosis not present

## 2020-01-02 DIAGNOSIS — K810 Acute cholecystitis: Secondary | ICD-10-CM | POA: Diagnosis not present

## 2020-01-02 DIAGNOSIS — I10 Essential (primary) hypertension: Secondary | ICD-10-CM | POA: Diagnosis not present

## 2020-01-02 DIAGNOSIS — Z20822 Contact with and (suspected) exposure to covid-19: Secondary | ICD-10-CM | POA: Diagnosis present

## 2020-01-02 DIAGNOSIS — T8110XA Postprocedural shock unspecified, initial encounter: Secondary | ICD-10-CM | POA: Diagnosis not present

## 2020-01-02 DIAGNOSIS — E1165 Type 2 diabetes mellitus with hyperglycemia: Secondary | ICD-10-CM | POA: Diagnosis present

## 2020-01-02 DIAGNOSIS — D62 Acute posthemorrhagic anemia: Secondary | ICD-10-CM | POA: Diagnosis not present

## 2020-01-02 DIAGNOSIS — E785 Hyperlipidemia, unspecified: Secondary | ICD-10-CM | POA: Diagnosis present

## 2020-01-02 DIAGNOSIS — Z79899 Other long term (current) drug therapy: Secondary | ICD-10-CM | POA: Diagnosis not present

## 2020-01-02 DIAGNOSIS — R079 Chest pain, unspecified: Secondary | ICD-10-CM | POA: Diagnosis not present

## 2020-01-02 DIAGNOSIS — R001 Bradycardia, unspecified: Secondary | ICD-10-CM | POA: Diagnosis not present

## 2020-01-02 LAB — CBC
HCT: 34.8 % — ABNORMAL LOW (ref 39.0–52.0)
Hemoglobin: 11.4 g/dL — ABNORMAL LOW (ref 13.0–17.0)
MCH: 32.5 pg (ref 26.0–34.0)
MCHC: 32.8 g/dL (ref 30.0–36.0)
MCV: 99.1 fL (ref 80.0–100.0)
Platelets: 161 10*3/uL (ref 150–400)
RBC: 3.51 MIL/uL — ABNORMAL LOW (ref 4.22–5.81)
RDW: 20.2 % — ABNORMAL HIGH (ref 11.5–15.5)
WBC: 13.3 10*3/uL — ABNORMAL HIGH (ref 4.0–10.5)
nRBC: 0 % (ref 0.0–0.2)

## 2020-01-02 LAB — HEPATIC FUNCTION PANEL
ALT: 160 U/L — ABNORMAL HIGH (ref 0–44)
AST: 220 U/L — ABNORMAL HIGH (ref 15–41)
Albumin: 3.5 g/dL (ref 3.5–5.0)
Alkaline Phosphatase: 80 U/L (ref 38–126)
Bilirubin, Direct: 2.4 mg/dL — ABNORMAL HIGH (ref 0.0–0.2)
Indirect Bilirubin: 1.3 mg/dL — ABNORMAL HIGH (ref 0.3–0.9)
Total Bilirubin: 3.7 mg/dL — ABNORMAL HIGH (ref 0.3–1.2)
Total Protein: 6.7 g/dL (ref 6.5–8.1)

## 2020-01-02 LAB — CBG MONITORING, ED
Glucose-Capillary: 221 mg/dL — ABNORMAL HIGH (ref 70–99)
Glucose-Capillary: 182 mg/dL — ABNORMAL HIGH (ref 70–99)
Glucose-Capillary: 136 mg/dL — ABNORMAL HIGH (ref 70–99)
Glucose-Capillary: 119 mg/dL — ABNORMAL HIGH (ref 70–99)
Glucose-Capillary: 104 mg/dL — ABNORMAL HIGH (ref 70–99)

## 2020-01-02 LAB — COMPREHENSIVE METABOLIC PANEL
ALT: 159 U/L — ABNORMAL HIGH (ref 0–44)
AST: 183 U/L — ABNORMAL HIGH (ref 15–41)
Albumin: 3.4 g/dL — ABNORMAL LOW (ref 3.5–5.0)
Alkaline Phosphatase: 76 U/L (ref 38–126)
Anion gap: 12 (ref 5–15)
BUN: 13 mg/dL (ref 8–23)
CO2: 20 mmol/L — ABNORMAL LOW (ref 22–32)
Calcium: 8.4 mg/dL — ABNORMAL LOW (ref 8.9–10.3)
Chloride: 102 mmol/L (ref 98–111)
Creatinine, Ser: 0.88 mg/dL (ref 0.61–1.24)
GFR, Estimated: >60 mL/min (ref >60)
Glucose, Bld: 232 mg/dL — ABNORMAL HIGH (ref 70–99)
Potassium: 4.5 mmol/L (ref 3.5–5.1)
Sodium: 134 mmol/L — ABNORMAL LOW (ref 135–145)
Total Bilirubin: 4 mg/dL — ABNORMAL HIGH (ref 0.3–1.2)
Total Protein: 6.7 g/dL (ref 6.5–8.1)

## 2020-01-02 LAB — RESPIRATORY PANEL BY RT PCR (FLU A&B, COVID)
Influenza A by PCR: NEGATIVE
Influenza B by PCR: NEGATIVE
SARS Coronavirus 2 by RT PCR: NEGATIVE

## 2020-01-02 LAB — BASIC METABOLIC PANEL
Anion gap: 12 (ref 5–15)
BUN: 12 mg/dL (ref 8–23)
CO2: 21 mmol/L — ABNORMAL LOW (ref 22–32)
Calcium: 8.6 mg/dL — ABNORMAL LOW (ref 8.9–10.3)
Chloride: 102 mmol/L (ref 98–111)
Creatinine, Ser: 0.87 mg/dL (ref 0.61–1.24)
GFR, Estimated: >60 mL/min (ref >60)
Glucose, Bld: 225 mg/dL — ABNORMAL HIGH (ref 70–99)
Potassium: 4.3 mmol/L (ref 3.5–5.1)
Sodium: 135 mmol/L (ref 135–145)

## 2020-01-02 LAB — HEPARIN LEVEL (UNFRACTIONATED): Heparin Unfractionated: 0.56 IU/mL (ref 0.30–0.70)

## 2020-01-02 LAB — LIPID PANEL
Cholesterol: 150 mg/dL (ref 0–200)
HDL: 61 mg/dL (ref >40)
Total CHOL/HDL Ratio: 2.5 RATIO
Triglycerides: 39 mg/dL (ref <150)
VLDL: 8 mg/dL (ref 0–40)

## 2020-01-02 LAB — LIPASE, BLOOD
Lipase: 3987 U/L — ABNORMAL HIGH (ref 11–51)
Lipase: 1719 U/L — ABNORMAL HIGH (ref 11–51)

## 2020-01-02 LAB — TROPONIN I (HIGH SENSITIVITY)
Troponin I (High Sensitivity): 4 ng/L (ref <18)
Troponin I (High Sensitivity): 5 ng/L (ref <18)

## 2020-01-02 LAB — APTT: aPTT: 69 seconds — ABNORMAL HIGH (ref 24–36)

## 2020-01-02 MED ORDER — ONDANSETRON HCL 4 MG/2ML IJ SOLN
4.0000 mg | Freq: Four times a day (QID) | INTRAMUSCULAR | Status: DC | PRN
  Administered 2020-01-02 – 2020-01-04 (×2): 4 mg via INTRAVENOUS
  Filled 2020-01-02 (×2): qty 2

## 2020-01-02 MED ORDER — HYDROMORPHONE HCL 1 MG/ML IJ SOLN
1.0000 mg | Freq: Once | INTRAMUSCULAR | Status: AC
  Administered 2020-01-02: 1 mg via INTRAVENOUS
  Filled 2020-01-02: qty 1

## 2020-01-02 MED ORDER — ACETAMINOPHEN 325 MG PO TABS: 650.0000 mg | ORAL_TABLET | Freq: Four times a day (QID) | ORAL | Status: DC | PRN

## 2020-01-02 MED ORDER — ACETAMINOPHEN 650 MG RE SUPP: 650.0000 mg | Freq: Four times a day (QID) | RECTAL | Status: DC | PRN

## 2020-01-02 MED ORDER — PIPERACILLIN-TAZOBACTAM 3.375 G IVPB
3.3750 g | Freq: Three times a day (TID) | INTRAVENOUS | Status: DC
  Administered 2020-01-02 – 2020-01-07 (×16): 3.375 g via INTRAVENOUS
  Filled 2020-01-02 (×19): qty 50

## 2020-01-02 MED ORDER — SODIUM CHLORIDE 0.9% FLUSH
3.0000 mL | Freq: Two times a day (BID) | INTRAVENOUS | Status: DC
  Administered 2020-01-02 – 2020-01-08 (×9): 3 mL via INTRAVENOUS

## 2020-01-02 MED ORDER — AMIODARONE HCL 200 MG PO TABS
200.0000 mg | ORAL_TABLET | Freq: Every day | ORAL | Status: DC
  Administered 2020-01-02 – 2020-01-08 (×7): 200 mg via ORAL
  Filled 2020-01-02 (×7): qty 1

## 2020-01-02 MED ORDER — HYDROMORPHONE HCL 1 MG/ML IJ SOLN
0.5000 mg | INTRAMUSCULAR | Status: DC | PRN
  Administered 2020-01-02 – 2020-01-04 (×7): 1 mg via INTRAVENOUS
  Filled 2020-01-02 (×7): qty 1

## 2020-01-02 MED ORDER — LACTATED RINGERS IV SOLN: INTRAVENOUS | Status: DC

## 2020-01-02 MED ORDER — METOPROLOL SUCCINATE ER 50 MG PO TB24
50.0000 mg | ORAL_TABLET | Freq: Every day | ORAL | Status: DC
  Administered 2020-01-02 – 2020-01-04 (×3): 50 mg via ORAL
  Filled 2020-01-02 (×2): qty 1
  Filled 2020-01-02: qty 2

## 2020-01-02 MED ORDER — SODIUM CHLORIDE 0.9 % IV SOLN: INTRAVENOUS | Status: DC

## 2020-01-02 MED ORDER — INSULIN ASPART 100 UNIT/ML ~~LOC~~ SOLN
0.0000 [IU] | SUBCUTANEOUS | Status: DC
  Administered 2020-01-02: 2 [IU] via SUBCUTANEOUS
  Administered 2020-01-02: 3 [IU] via SUBCUTANEOUS
  Administered 2020-01-02 – 2020-01-03 (×3): 1 [IU] via SUBCUTANEOUS
  Administered 2020-01-04: 2 [IU] via SUBCUTANEOUS
  Administered 2020-01-04: 1 [IU] via SUBCUTANEOUS
  Administered 2020-01-05: 3 [IU] via SUBCUTANEOUS
  Administered 2020-01-05: 1 [IU] via SUBCUTANEOUS
  Administered 2020-01-05: 2 [IU] via SUBCUTANEOUS
  Administered 2020-01-05 (×3): 3 [IU] via SUBCUTANEOUS
  Administered 2020-01-06: 1 [IU] via SUBCUTANEOUS
  Administered 2020-01-06: 2 [IU] via SUBCUTANEOUS
  Administered 2020-01-06: 1 [IU] via SUBCUTANEOUS
  Administered 2020-01-06 (×2): 2 [IU] via SUBCUTANEOUS

## 2020-01-02 MED ORDER — FAMOTIDINE IN NACL 20-0.9 MG/50ML-% IV SOLN
20.0000 mg | Freq: Two times a day (BID) | INTRAVENOUS | Status: DC
  Administered 2020-01-02 – 2020-01-08 (×13): 20 mg via INTRAVENOUS
  Filled 2020-01-02 (×14): qty 50

## 2020-01-02 MED ORDER — ONDANSETRON HCL 4 MG PO TABS: 4.0000 mg | ORAL_TABLET | Freq: Four times a day (QID) | ORAL | Status: DC | PRN

## 2020-01-02 MED ORDER — HEPARIN (PORCINE) 25000 UT/250ML-% IV SOLN
1100.0000 [IU]/h | INTRAVENOUS | Status: DC
  Administered 2020-01-02 – 2020-01-03 (×2): 1100 [IU]/h via INTRAVENOUS
  Filled 2020-01-02 (×2): qty 250

## 2020-01-02 NOTE — ED Notes (Signed)
Report attempted, left call back number

## 2020-01-02 NOTE — Progress Notes (Signed)
[facility] for Heparin (Apixaban on hold) Indication: atrial fibrillation  No Known Allergies   Vital Signs: Temp: 98 F (36.7 C) (11/08 0645) Temp Source: Oral (11/08 0645) BP: 168/89 (11/08 1500) Pulse Rate: 63 (11/08 1500)  Labs: Recent Labs    01/01/20 2347 01/02/20 0207 01/02/20 0500 01/02/20 1344 01/02/20 1345  HGB 11.5*  --  11.4*  --   --   HCT 34.8*  --  34.8*  --   --   PLT 157  --  161  --   --   APTT  --   --   --   --  69*  HEPARINUNFRC  --   --   --  0.56  --   CREATININE 0.87  --  0.88  --   --   TROPONINIHS 5 4  --   --   --     Estimated Creatinine Clearance: 72.3 mL/min (by C-G formula based on SCr of 0.88 mg/dL).   Medical History: Past Medical History:  Diagnosis Date  . Anemia   . Essential hypertension   . History of atrial fibrillation   . History of GI bleed   . Type 2 diabetes mellitus [facility])     Assessment: 79 y/o M transfer from outside hospital with intra-abdominal infection. He is on apixaban PTA for afib. Holding apixaban and starting heparin as pt will likely need surgery or procedure this admission. Pharmacy consulted to dose heparin.  Heparin level and aPTT this afternoon are therapeutic after starting heparin at 1100 units/hr. No issues with line or bleeding per RN. As heparin level and aPTT correlating, will monitor by heparin levels alone.   Goal of Therapy:  Heparin level 0.3-0.7 units/ml aPTT 66-102 seconds Monitor platelets by anticoagulation protocol: Yes   Plan:  -Continue heparin 1100 units/hr -Daily CBC, heparin level -Monitor for bleeding  Rebbeca Paul, PharmD PGY1 Pharmacy Resident 01/02/2020 3:18 PM  Please check [email or web address] for unit-specific pharmacy phone numbers.

## 2020-01-02 NOTE — Consult Note (Addendum)
CC: MEG pain  Requesting provider: Dr. Dolly Rias  HPI: Andrew Humphrey is an 79 y.o. male with hx of HTN, HLD, DM, Afib (on Eliquis) presented as possible code STEMI but this was ruled out and cancelled. He reports severe MEG pain that is burning that has been present x 1d.  Reports similar symptoms that he was recently managed for last month at [facility].  He was seen by Dr. Aviva Signs.  Ultimately, is felt that the gallbladder was not the culprit.  He was discharged.  He returns with similar symptoms.  He reports associated nausea.  Denies any emesis.  Nothing seems to make his symptoms better or worse.  The pain he experiences does radiate to his right mid back.  Past Medical History:  Diagnosis Date  . Anemia   . Essential hypertension   . History of atrial fibrillation   . History of GI bleed   . Type 2 diabetes mellitus ([place])     Past Surgical History:  Procedure Laterality Date  . COLONOSCOPY N/A 11/09/2016   Procedure: COLONOSCOPY;  Surgeon: Danie Binder, MD;  Location: [facility];  Service: Endoscopy;  Laterality: N/A;  . [name]  . TONSILLECTOMY      Family History  Problem Relation Age of Onset  . Hypertension Father   . Colon cancer Neg Hx   . Colon polyps Neg Hx     Social:  reports that he has never smoked. He has never used smokeless tobacco. He reports current alcohol use. He reports that he does not use drugs.  Allergies: No Known Allergies  Medications: I have reviewed the patient's current medications.  Results for orders placed or performed during the hospital encounter of 01/01/20 (from the past 48 hour(s))  Basic metabolic panel     Status: Abnormal   Collection Time: 01/01/20 11:47 PM  Result Value Ref Range   Sodium 135 135 - 145 mmol/L   Potassium 4.3 3.5 - 5.1 mmol/L   Chloride 102 98 - 111 mmol/L   CO2 21 (L) 22 - 32 mmol/L   Glucose, Bld 225 (H) 70 - 99 mg/dL    Comment: Glucose reference range applies  only to samples taken after fasting for at least 8 hours.   BUN 12 8 - 23 mg/dL   Creatinine, Ser 0.87 0.61 - 1.24 mg/dL   Calcium 8.6 (L) 8.9 - 10.3 mg/dL   GFR, Estimated >60 >60 mL/min    Comment: (NOTE) Calculated using the CKD-EPI Creatinine Equation (2021)    Anion gap 12 5 - 15    Comment: Performed at [facility] [address] [place], [place] [address]  CBC     Status: Abnormal   Collection Time: 01/01/20 11:47 PM  Result Value Ref Range   WBC 16.8 (H) 4.0 - 10.5 K/uL   RBC 3.56 (L) 4.22 - 5.81 MIL/uL   Hemoglobin 11.5 (L) 13.0 - 17.0 g/dL   HCT 34.8 (L) 39 - 52 %   MCV 97.8 80.0 - 100.0 fL   MCH 32.3 26.0 - 34.0 pg   MCHC 33.0 30.0 - 36.0 g/dL   RDW 20.0 (H) 11.5 - 15.5 %   Platelets 157 150 - 400 K/uL   nRBC 0.0 0.0 - 0.2 %    Comment: Performed at [facility] [address], [place], [place] [address]  Hepatic function panel     Status: Abnormal   Collection  Time: 01/01/20 11:47 PM  Result Value Ref Range   Total Protein 6.7 6.5 - 8.1 g/dL   Albumin 3.5 3.5 - 5.0 g/dL   AST 220 (H) 15 - 41 U/L   ALT 160 (H) 0 - 44 U/L   Alkaline Phosphatase 80 38 - 126 U/L   Total Bilirubin 3.7 (H) 0.3 - 1.2 mg/dL   Bilirubin, Direct 2.4 (H) 0.0 - 0.2 mg/dL   Indirect Bilirubin 1.3 (H) 0.3 - 0.9 mg/dL    Comment: Performed at [facility] [address] [place], [place] [address]  Lipase, blood     Status: Abnormal   Collection Time: 01/01/20 11:47 PM  Result Value Ref Range   Lipase 3,987 (H) 11 - 51 U/L    Comment: RESULTS CONFIRMED BY MANUAL DILUTION Performed at [facility], [place] [address] [place], [place] [address]   Troponin I (High Sensitivity)     Status: None   Collection Time: 01/01/20 11:47 PM  Result Value Ref Range   Troponin I (High Sensitivity) 5 <18 ng/L    Comment: (NOTE) Elevated high sensitivity troponin I (hsTnI) values and significant  changes across serial measurements may suggest ACS but many other  chronic and  acute conditions are known to elevate hsTnI results.  Refer to the Links section for chest pain algorithms and additional  guidance. Performed at [facility], [place] [address] [place], [place] [address]   Lipid panel     Status: None   Collection Time: 01/01/20 11:47 PM  Result Value Ref Range   Cholesterol 150 0 - 200 mg/dL   Triglycerides 39 <150 mg/dL   HDL 61 >40 mg/dL   Total CHOL/HDL Ratio 2.5 RATIO   VLDL 8 0 - 40 mg/dL   LDL Cholesterol NOT CALCULATED 0 - 99 mg/dL    Comment: Performed at [facility] [address], [place], [place] [address]  Troponin I (High Sensitivity)     Status: None   Collection Time: 01/02/20  2:07 AM  Result Value Ref Range   Troponin I (High Sensitivity) 4 <18 ng/L    Comment: (NOTE) Elevated high sensitivity troponin I (hsTnI) values and significant  changes across serial measurements may suggest ACS but many other  chronic and acute conditions are known to elevate hsTnI results.  Refer to the "Links" section for chest pain algorithms and additional  guidance. Performed at [facility], [place] [address], [place], [place] [address]     US Abdomen Limited RUQ (LIVER/GB)  Result Date: 01/02/2020 CLINICAL DATA:  Initial evaluation for acute abdominal pain, elevated bilirubin, lipase, and Nataleah Scioneaux blood cell count. EXAM: ULTRASOUND ABDOMEN LIMITED RIGHT UPPER QUADRANT COMPARISON:  Prior MRI from 12/06/2019 FINDINGS: Gallbladder: Scattered echogenic material seen within the gallbladder lumen, consistent with sludge. Few scattered small stones versus tumefactive sludge measuring up to 8 mm noted. Gallbladder wall appears thickened up to approximately 8 mm. No free pericholecystic fluid. A positive sonographic Murphy sign was elicited on exam. Common bile duct: Diameter: 5.6 mm Liver: No focal lesion identified. Within normal limits in parenchymal echogenicity. Portal vein is patent on color Doppler imaging with normal direction of blood  flow towards the liver. Other: Small volume free fluid present within the right upper quadrant. IMPRESSION: 1. Gallbladder sludge and possible stones with associated gallbladder wall thickening and positive sonographic Murphy sign. Clinical correlation for possible acute cholecystitis recommended. 2. No biliary dilatation.  No visible choledocholithiasis. 3. Small  volume free fluid within the right upper quadrant. Electronically Signed   By: Jeannine Boga M.D.   On: 01/02/2020 01:48    ROS - all of the below systems have been reviewed with the patient and positives are indicated with bold text General: chills, fever or night sweats Eyes: blurry vision or double vision ENT: epistaxis or sore throat Allergy/Immunology: itchy/watery eyes or nasal congestion Hematologic/Lymphatic: bleeding problems, blood clots or swollen lymph nodes Endocrine: temperature intolerance or unexpected weight changes Breast: new or changing breast lumps or nipple discharge Resp: cough, shortness of breath, or wheezing CV: chest pain or dyspnea on exertion GI: as per HPI GU: dysuria, trouble voiding, or hematuria MSK: joint pain or joint stiffness Neuro: TIA or stroke symptoms Derm: pruritus and skin lesion changes Psych: anxiety and depression  PE Blood pressure (!) 177/93, pulse 64, temperature 97.9 F (36.6 C), temperature source Temporal, resp. rate 15, SpO2 96 %. Constitutional: NAD; conversant; no deformities; jaundiced Eyes: Moist conjunctiva; no lid lag; +scleral icterus; PERRL Neck: Trachea midline; no thyromegaly Lungs: Normal respiratory effort; no tactile fremitus CV: irreg rate/rhythm; no palpable thrills; no pitting edema GI: Abd soft, tender to palpation across RUQ, MEG, LUQ; nondistended; no palpable hepatosplenomegaly MSK: Normal range of motion of extremities; no clubbing/cyanosis Psychiatric: Appropriate affect; alert and oriented x3 Lymphatic: No palpable cervical or axillary  lymphadenopathy  Results for orders placed or performed during the hospital encounter of 01/01/20 (from the past 48 hour(s))  Basic metabolic panel     Status: Abnormal   Collection Time: 01/01/20 11:47 PM  Result Value Ref Range   Sodium 135 135 - 145 mmol/L   Potassium 4.3 3.5 - 5.1 mmol/L   Chloride 102 98 - 111 mmol/L   CO2 21 (L) 22 - 32 mmol/L   Glucose, Bld 225 (H) 70 - 99 mg/dL    Comment: Glucose reference range applies only to samples taken after fasting for at least 8 hours.   BUN 12 8 - 23 mg/dL   Creatinine, Ser 0.87 0.61 - 1.24 mg/dL   Calcium 8.6 (L) 8.9 - 10.3 mg/dL   GFR, Estimated >60 >60 mL/min    Comment: (NOTE) Calculated using the CKD-EPI Creatinine Equation (2021)    Anion gap 12 5 - 15    Comment: Performed at [facility] [address], [place], [place] [address]  CBC     Status: Abnormal   Collection Time: 01/01/20 11:47 PM  Result Value Ref Range   WBC 16.8 (H) 4.0 - 10.5 K/uL   RBC 3.56 (L) 4.22 - 5.81 MIL/uL   Hemoglobin 11.5 (L) 13.0 - 17.0 g/dL   HCT 34.8 (L) 39 - 52 %   MCV 97.8 80.0 - 100.0 fL   MCH 32.3 26.0 - 34.0 pg   MCHC 33.0 30.0 - 36.0 g/dL   RDW 20.0 (H) 11.5 - 15.5 %   Platelets 157 150 - 400 K/uL   nRBC 0.0 0.0 - 0.2 %    Comment: Performed at [facility] [address] [place], [place] [address]  Hepatic function panel     Status: Abnormal   Collection Time: 01/01/20 11:47 PM  Result Value Ref Range   Total Protein 6.7 6.5 - 8.1 g/dL   Albumin 3.5 3.5 - 5.0 g/dL   AST 220 (H) 15 - 41 U/L   ALT 160 (H) 0 - 44 U/L   Alkaline Phosphatase 80 38 - 126 U/L   Total Bilirubin 3.7 (  H) 0.3 - 1.2 mg/dL   Bilirubin, Direct 2.4 (H) 0.0 - 0.2 mg/dL   Indirect Bilirubin 1.3 (H) 0.3 - 0.9 mg/dL    Comment: Performed at [facility] [address] [place], [place] [address]  Lipase, blood     Status: Abnormal   Collection Time: 01/01/20 11:47 PM  Result Value Ref Range   Lipase 3,987 (H) 11 - 51 U/L    Comment:  RESULTS CONFIRMED BY MANUAL DILUTION Performed at [facility], [facility] [address], [place], [place] [address]   Troponin I (High Sensitivity)     Status: None   Collection Time: 01/01/20 11:47 PM  Result Value Ref Range   Troponin I (High Sensitivity) 5 <18 ng/L    Comment: (NOTE) Elevated high sensitivity troponin I (hsTnI) values and significant  changes across serial measurements may suggest ACS but many other  chronic and acute conditions are known to elevate hsTnI results.  Refer to the Links section for chest pain algorithms and additional  guidance. Performed at [facility], [place] [address] [place], [place] [address]   Lipid panel     Status: None   Collection Time: 01/01/20 11:47 PM  Result Value Ref Range   Cholesterol 150 0 - 200 mg/dL   Triglycerides 39 <150 mg/dL   HDL 61 >40 mg/dL   Total CHOL/HDL Ratio 2.5 RATIO   VLDL 8 0 - 40 mg/dL   LDL Cholesterol NOT CALCULATED 0 - 99 mg/dL    Comment: Performed at [facility] [address], [place], [place] [address]  Troponin I (High Sensitivity)     Status: None   Collection Time: 01/02/20  2:07 AM  Result Value Ref Range   Troponin I (High Sensitivity) 4 <18 ng/L    Comment: (NOTE) Elevated high sensitivity troponin I (hsTnI) values and significant  changes across serial measurements may suggest ACS but many other  chronic and acute conditions are known to elevate hsTnI results.  Refer to the "Links" section for chest pain algorithms and additional  guidance. Performed at [facility], [place] [address] [place], [place] [address]     US Abdomen Limited RUQ (LIVER/GB)  Result Date: 01/02/2020 CLINICAL DATA:  Initial evaluation for acute abdominal pain, elevated bilirubin, lipase, and Kadeen Sroka blood cell count. EXAM: ULTRASOUND ABDOMEN LIMITED RIGHT UPPER QUADRANT COMPARISON:  Prior MRI from 12/06/2019 FINDINGS: Gallbladder: Scattered echogenic material seen within the gallbladder lumen, consistent  with sludge. Few scattered small stones versus tumefactive sludge measuring up to 8 mm noted. Gallbladder wall appears thickened up to approximately 8 mm. No free pericholecystic fluid. A positive sonographic Murphy sign was elicited on exam. Common bile duct: Diameter: 5.6 mm Liver: No focal lesion identified. Within normal limits in parenchymal echogenicity. Portal vein is patent on color Doppler imaging with normal direction of blood flow towards the liver. Other: Small volume free fluid present within the right upper quadrant. IMPRESSION: 1. Gallbladder sludge and possible stones with associated gallbladder wall thickening and positive sonographic Murphy sign. Clinical correlation for possible acute cholecystitis recommended. 2. No biliary dilatation.  No visible choledocholithiasis. 3. Small volume free fluid within the right upper quadrant. Electronically Signed   By: Jeannine Boga M.D.   On: 01/02/2020 01:48     A/P: Andrew Humphrey is an 79 y.o. male with biliary pancreatitis +/- choledocholithiasis  -Empiric Zosyn coverage -NPO, MIVF -Hold eliquis; ok for hep gtt if needed for coverage -Will need  cardiology to see for periop risk stratification [id number]. If candidate, would certainly benefit from cholecystectomy this admission to prevent yet another recurrence of his problem. He has evident 'sludge' and potentially small stones - both of which are well known causes of biliary pancreatitis. His bilirubin was elevated his prior admission at [facility] 10/8 as well. This normalized over the ensuing days -We will follow with you  Nadeen Landau, MD [facility] Use [email or web address] to contact on call provider

## 2020-01-02 NOTE — ED Provider Notes (Signed)
[facility] EMERGENCY DEPARTMENT Provider Note   CSN: [id number] Arrival date & time: 01/01/20  2323     History Chief Complaint  Patient presents with  . Chest Pain  . Abdominal Pain    Andrew Humphrey is a 79 y.o. male.  79 year old male the presents as a transfer from [facility] secondary to concern for STEMI.  Apparently patient has been admitted a couple times in the last month for pancreatitis.  Unclear etiology however once I did have cholecystitis associated with it.  Never had a cholecystectomy.  Patient symptoms started earlier tonight and went to [facility] where EKG showed lateral STEMI with inferior reciprocal changes.  His lipase was 10,000.  His white count was 20,000.  However the EKG changes were new so discussed with cardiology and decided on ED to ED transfer for evaluation for possible catheterization.  At this time patient is chest pain-free does not really even have epigastric pain is more right upper quadrant pain.Initially had a large amount of emesis apparently and is not having that now.   Chest Pain Associated symptoms: abdominal pain   Abdominal Pain Associated symptoms: chest pain        Past Medical History:  Diagnosis Date  . Anemia   . Essential hypertension   . History of atrial fibrillation   . History of GI bleed   . Type 2 diabetes mellitus [facility])     Patient Active Problem List   Diagnosis Date Noted  . Sepsis due to Klebsiella ([place]) 12/09/2019  . Cholecystitis, acute   . SIRS (systemic inflammatory response syndrome) ([place]) 12/05/2019  . AKI (acute kidney injury) ([place]) 12/05/2019  . Atrial fibrillation with RVR ([place]) 06/19/2017  . Elevated troponin 06/19/2017  . Hypertension   . Diabetes mellitus without complication ([place])   . History of GI bleed 11/08/2016  . Acute blood loss anemia 11/08/2016  . Rectal bleeding     Past Surgical History:  Procedure Laterality Date  . COLONOSCOPY N/A 11/09/2016    Procedure: COLONOSCOPY;  Surgeon: Danie Binder, MD;  [facility];  Service: Endoscopy;  Laterality: N/A;  . [place]  . TONSILLECTOMY         Family History  Problem Relation Age of Onset  . Hypertension Father   . Colon cancer Neg Hx   . Colon polyps Neg Hx     Social History   Tobacco Use  . Smoking status: Never Smoker  . Smokeless tobacco: Never Used  Substance Use Topics  . Alcohol use: Yes    Comment: occasional beer  . Drug use: No    Home Medications Prior to Admission medications   Medication Sig Start Date End Date Taking? Authorizing Provider  amiodarone (PACERONE) 200 MG tablet Take 1 tablet (200 mg total) by mouth daily. 12/19/19   Erlene Quan, PA-C  apixaban (ELIQUIS) 5 MG TABS tablet Take 1 tablet (5 mg total) by mouth 2 (two) times daily. 12/28/19 01/27/20  Satira Sark, MD  glipiZIDE (GLUCOTROL XL) 10 MG 24 hr tablet Take 20 mg by mouth daily. 11/21/19   [provider]  metFORMIN (GLUCOPHAGE) 1000 MG tablet Take 1,000 mg by mouth in the morning and at bedtime.  06/12/17   [provider]  metoprolol succinate (TOPROL XL) 50 MG 24 hr tablet Take 1 tablet (50 mg total) by mouth daily. Take with or immediately following a meal. 12/19/19   Kerin Ransom  K, PA-C  omeprazole (PRILOSEC) 40 MG capsule Take 40 mg by mouth daily as needed (indigestion).  11/21/19   [provider]    Allergies    Patient has no known allergies.  Review of Systems   Review of Systems  Cardiovascular: Positive for chest pain.  Gastrointestinal: Positive for abdominal pain.  All other systems reviewed and are negative.   Physical Exam Updated Vital Signs BP (!) 171/92   Pulse 71   Temp 97.9 F (36.6 C) (Temporal)   Resp 18   SpO2 97%   Physical Exam Vitals and nursing note reviewed.  Constitutional:      Appearance: He is well-developed.  HENT:     Head: Normocephalic and atraumatic.     Mouth/Throat:      Mouth: Mucous membranes are dry.  Eyes:     Conjunctiva/sclera: Conjunctivae normal.     Pupils: Pupils are equal, round, and reactive to light.  Cardiovascular:     Rate and Rhythm: Normal rate.  Pulmonary:     Effort: Pulmonary effort is normal. No respiratory distress.  Abdominal:     General: There is no distension.     Tenderness: There is abdominal tenderness (RUQ).  Musculoskeletal:        General: Normal range of motion.     Cervical back: Normal range of motion.  Skin:    General: Skin is warm and dry.     Coloration: Skin is pale.  Neurological:     General: No focal deficit present.     Mental Status: He is alert and oriented to person, place, and time.     ED Results / Procedures / Treatments   Labs (all labs ordered are listed, but only abnormal results are displayed) Labs Reviewed  BASIC METABOLIC PANEL - Abnormal; Notable for the following components:      Result Value   CO2 21 (*)    Glucose, Bld 225 (*)    Calcium 8.6 (*)    All other components within normal limits  CBC - Abnormal; Notable for the following components:   WBC 16.8 (*)    RBC 3.56 (*)    Hemoglobin 11.5 (*)    HCT 34.8 (*)    RDW 20.0 (*)    All other components within normal limits  HEPATIC FUNCTION PANEL - Abnormal; Notable for the following components:   AST 220 (*)    ALT 160 (*)    Total Bilirubin 3.7 (*)    Bilirubin, Direct 2.4 (*)    Indirect Bilirubin 1.3 (*)    All other components within normal limits  LIPID PANEL  LIPASE, BLOOD  TROPONIN I (HIGH SENSITIVITY)    EKG EKG Interpretation  Date/Time:  Sunday January 01 2020 23:27:31 EST Ventricular Rate:  68 PR Interval:    QRS Duration: 122 QT Interval:  444 QTC Calculation: 473 R Axis:   -44 Text Interpretation: Sinus rhythm Nonspecific IVCD with LAD Left ventricular hypertrophy Borderline T abnormalities, inferior leads Baseline wander in lead(s) V1 TWI in III new since 10/15, otherwise no significant change  Confirmed by Merrily Pew [phone]) on 01/02/2020 12:01:21 AM   Radiology No results found.  Procedures .Critical Care Performed by: Merrily Pew, MD Authorized by: Merrily Pew, MD   Critical care provider statement:    Critical care time (minutes):  45   Critical care was necessary to treat or prevent imminent or life-threatening deterioration of the following conditions:  Dehydration and metabolic crisis   Critical  care was time spent personally by me on the following activities:  Discussions with consultants, evaluation of patient's response to treatment, examination of patient, ordering and performing treatments and interventions, ordering and review of laboratory studies, ordering and review of radiographic studies, pulse oximetry, re-evaluation of patient's condition, obtaining history from patient or surrogate and review of old charts   (including critical care time)  Medications Ordered in ED Medications - No data to display  ED Course  I have reviewed the triage vital signs and the nursing notes.  Pertinent labs & imaging results that were available during my care of the patient were reviewed by me and considered in my medical decision making (see chart for details).    MDM Rules/Calculators/A&P                          EKG performed here and improved.  No obvious ST elevation at this time.  Dr. Burt Knack with cardiology felt like this more likely a medical issue especially with a troponin of 3 there.  We will plan for repeat troponin.  Medical admission for likely pancreatitis.  I will add on ultrasound as I am not clear that a cause of her was found for the pancreatitis in the past could be choledocholithiasis or something similar.  Pain controlled.  Nausea controlled.  No indication for further medications at this time.   Pain returned, meds ordered.   Medicine consulted, will admit.   Final Clinical Impression(s) / ED Diagnoses Final diagnoses:  Abdominal pain     Rx / DC Orders ED Discharge Orders    None       Jaydan Chretien, Corene Cornea, MD 01/02/20 [phone]

## 2020-01-02 NOTE — Progress Notes (Signed)
Called report. His room is ready.

## 2020-01-02 NOTE — ED Notes (Signed)
Pt does not want hospital bed at this time

## 2020-01-02 NOTE — H&P (Signed)
History and Physical    Andrew Humphrey UXL:[id number] DOB: 05-Jun-1940 DOA: 01/01/2020  PCP: Lanelle Bal, PA-C   Patient coming from: Home   Chief Complaint: Upper abdominal pain, N/V   HPI: Andrew Humphrey is a 79 y.o. male with medical history significant for atrial fibrillation on Eliquis, type 2 diabetes mellitus, hypertension, and recent admission with Klebsiella bacteremia suspected secondary to cholecystitis, now presenting to the emergency department with acute upper abdominal pain, nausea, and an episode of vomiting.  Patient was admitted to [facility] last month with recurring bouts of upper abdominal pain, was found to have sepsis most likely secondary to cholecystitis, improved with conservative management, and went home.  He continued to do well until approximately 5 PM yesterday evening when he developed acute onset epigastric pain with nausea and nonbloody vomiting.  He has not noticed any fevers or chills and denies any new cough or change in his chronic dyspnea.  He was initially evaluated tonight at an outside hospital where EKG was concerning for STEMI, and was sent here as ED to ED transfer for interventional cardiology evaluation.  The concerning EKG features had resolved by time of arrival at [facility] ED, high-sensitivity troponin was normal, clinical scenario was most consistent with an intra-abdominal process, and the code STEMI was canceled.  ED Course: Upon arrival to the ED, patient is found to be afebrile, saturating in the mid 90s on room air, and with stable blood pressure.  EKG features a sinus rhythm with nonspecific IVCD with LAD and LVH.  Chemistry panel is notable for glucose 225, AST 220, ALT 160, total bilirubin 3.7, and lipase 3987.  CBC features a leukocytosis to 16,800.  High-sensitivity troponin is normal x2 more than 2 hours apart.  Abdominal ultrasound reveals gallbladder sludge and possible stones with gallbladder wall thickening and positive  sonographic Murphy sign.  Patient was treated with Dilaudid.  Surgery was consulted by the ED physician and hospitalist were asked to admit.  COVID-19 screening test is negative.  Review of Systems:  All other systems reviewed and apart from HPI, are negative.  Past Medical History:  Diagnosis Date  . Anemia   . Essential hypertension   . History of atrial fibrillation   . History of GI bleed   . Type 2 diabetes mellitus ([place])     Past Surgical History:  Procedure Laterality Date  . COLONOSCOPY N/A 11/09/2016   Procedure: COLONOSCOPY;  Surgeon: Danie Binder, MD;  Location[facility];  Service: Endoscopy;  Laterality: N/A;  . [place]  . TONSILLECTOMY      Social History:   reports that he has never smoked. He has never used smokeless tobacco. He reports current alcohol use. He reports that he does not use drugs.  No Known Allergies  Family History  Problem Relation Age of Onset  . Hypertension Father   . Colon cancer Neg Hx   . Colon polyps Neg Hx      Prior to Admission medications   Medication Sig Start Date End Date Taking? Authorizing Provider  amiodarone (PACERONE) 200 MG tablet Take 1 tablet (200 mg total) by mouth daily. 12/19/19  Yes Kilroy, Doreene Burke, PA-C  apixaban (ELIQUIS) 5 MG TABS tablet Take 1 tablet (5 mg total) by mouth 2 (two) times daily. 12/28/19 01/27/20 Yes Satira Sark, MD  glipiZIDE (GLUCOTROL XL) 10 MG 24 hr tablet Take 20 mg by mouth daily. 11/21/19  Yes [provider]  metFORMIN (GLUCOPHAGE) 1000 MG tablet Take 1,000 mg by mouth in the morning and at bedtime.  06/12/17  Yes [provider]  metoprolol succinate (TOPROL XL) 50 MG 24 hr tablet Take 1 tablet (50 mg total) by mouth daily. Take with or immediately following a meal. 12/19/19  Yes Kilroy, Doreene Burke, PA-C  omeprazole (PRILOSEC) 40 MG capsule Take 40 mg by mouth daily as needed (indigestion).  11/21/19  Yes [provider]    Physical  Exam: Vitals:   01/02/20 0128 01/02/20 0134 01/02/20 0245 01/02/20 0300  BP:   (!) 177/93 (!) 162/76  Pulse: 64 69 64 68  Resp: 14 20 15 17   Temp:      TempSrc:      SpO2: 95% 96% 96% 96%    Constitutional: NAD, calm  Eyes: PERTLA, lids and conjunctivae normal ENMT: Mucous membranes are moist. Posterior pharynx clear of any exudate or lesions.   Neck: normal, supple, no masses, no thyromegaly Respiratory:  no wheezing, no crackles. No accessory muscle use.  Cardiovascular: S1 & S2 heard, regular rate and rhythm. No extremity edema.  Abdomen: soft, tender in epigastrium and RUQ. Bowel sounds active.  Musculoskeletal: no clubbing / cyanosis. No joint deformity upper and lower extremities.   Skin: no significant rashes, lesions, ulcers. Warm, dry, well-perfused. Neurologic: No facial asymmetry. Sensation intact. Moving all extremities.  Psychiatric: Alert and oriented to person, place, and situation. Very pleasant and cooperative.    Labs and Imaging on Admission: I have personally reviewed following labs and imaging studies  CBC: Recent Labs  Lab 01/01/20 2347  WBC 16.8*  HGB 11.5*  HCT 34.8*  MCV 97.8  PLT 665   Basic Metabolic Panel: Recent Labs  Lab 01/01/20 2347  NA 135  K 4.3  CL 102  CO2 21*  GLUCOSE 225*  BUN 12  CREATININE 0.87  CALCIUM 8.6*   GFR: Estimated Creatinine Clearance: 73.1 mL/min (by C-G formula based on SCr of 0.87 mg/dL). Liver Function Tests: Recent Labs  Lab 01/01/20 2347  AST 220*  ALT 160*  ALKPHOS 80  BILITOT 3.7*  PROT 6.7  ALBUMIN 3.5   Recent Labs  Lab 01/01/20 2347  LIPASE 3,987*   No results for input(s): AMMONIA in the last 168 hours. Coagulation Profile: No results for input(s): INR, PROTIME in the last 168 hours. Cardiac Enzymes: No results for input(s): CKTOTAL, CKMB, CKMBINDEX, TROPONINI in the last 168 hours. BNP (last 3 results) No results for input(s): PROBNP in the last 8760 hours. HbA1C: No results for  input(s): HGBA1C in the last 72 hours. CBG: No results for input(s): GLUCAP in the last 168 hours. Lipid Profile: Recent Labs    01/01/20 2347  CHOL 150  HDL 61  LDLCALC NOT CALCULATED  TRIG 39  CHOLHDL 2.5   Thyroid Function Tests: No results for input(s): TSH, T4TOTAL, FREET4, T3FREE, THYROIDAB in the last 72 hours. Anemia Panel: No results for input(s): VITAMINB12, FOLATE, FERRITIN, TIBC, IRON, RETICCTPCT in the last 72 hours. Urine analysis:    Component Value Date/Time   COLORURINE AMBER (A) 12/05/2019 1347   APPEARANCEUR CLOUDY (A) 12/05/2019 1347   LABSPEC 1.020 12/05/2019 1347   PHURINE 5.0 12/05/2019 1347   GLUCOSEU >=500 (A) 12/05/2019 1347   HGBUR NEGATIVE 12/05/2019 1347   BILIRUBINUR SMALL (A) 12/05/2019 1347   [place] 12/05/2019 1347   PROTEINUR 30 (A) 12/05/2019 1347   NITRITE NEGATIVE 12/05/2019 1347   LEUKOCYTESUR NEGATIVE 12/05/2019 1347  Sepsis Labs: @LABRCNTIP (procalcitonin:4,lacticidven:4) )No results found for this or any previous visit (from the past 240 hour(s)).   Radiological Exams on Admission: US Abdomen Limited RUQ (LIVER/GB)  Result Date: 01/02/2020 CLINICAL DATA:  Initial evaluation for acute abdominal pain, elevated bilirubin, lipase, and white blood cell count. EXAM: ULTRASOUND ABDOMEN LIMITED RIGHT UPPER QUADRANT COMPARISON:  Prior MRI from 12/06/2019 FINDINGS: Gallbladder: Scattered echogenic material seen within the gallbladder lumen, consistent with sludge. Few scattered small stones versus tumefactive sludge measuring up to 8 mm noted. Gallbladder wall appears thickened up to approximately 8 mm. No free pericholecystic fluid. A positive sonographic Murphy sign was elicited on exam. Common bile duct: Diameter: 5.6 mm Liver: No focal lesion identified. Within normal limits in parenchymal echogenicity. Portal vein is patent on color Doppler imaging with normal direction of blood flow towards the liver. Other: Small volume free  fluid present within the right upper quadrant. IMPRESSION: 1. Gallbladder sludge and possible stones with associated gallbladder wall thickening and positive sonographic Murphy sign. Clinical correlation for possible acute cholecystitis recommended. 2. No biliary dilatation.  No visible choledocholithiasis. 3. Small volume free fluid within the right upper quadrant. Electronically Signed   By: Jeannine Boga M.D.   On: 01/02/2020 01:48    EKG: Independently reviewed. Sinus rhythm, non-specific IVCD with LVH.    Assessment/Plan   1. Acute cholecystitis; biliary pancreatitis  - Presents with acute upper abdominal pain and nausea with an episode of vomiting and is found to have leukocytosis to 16,800 with lipase 3987, AST 220, ALT 160, t bili 3.7, and US findings concerning for acute cholecystitis but no biliary dilation  - Surgery consulting and much appreciated  - Continue bowel-rest, IVF hydration, antibiotics, pain-control, hold Eliquis   2. Atrial fibrillation  - In sinus rhythm on admission  - CHADS-VASc at least 3 (age x2, DM)  - Continue amiodarone and metoprolol as tolerated, hold Eliquis and start IV heparin in anticipation of surgery this admission   3. Type II DM  - A1c was 9.9% in October 2021  - Check CBGs and use low-intensity SSI     DVT prophylaxis: IV heparin, Eliquis pta  Code Status: Full  Family Communication: Discussed with patient  Disposition Plan:  Patient is from: home  Anticipated d/c is to: TBD Anticipated d/c date is: 3-5 days  Patient currently: will likely need surgery this admission  Consults called: Surgery  Admission status: Inpatient     Vianne Bulls, MD [facility]  01/02/2020, 4:21 AM

## 2020-01-02 NOTE — Progress Notes (Signed)
PROGRESS NOTE    BRENIN HEIDELBERGER  VOH:[id number] DOB: [birth year] DOA: 01/01/2020 PCP: Lanelle Bal, PA-C   Chief Complain: Abdominal pain, nausea, vomiting  Brief Narrative: Patient is a 79 year old male with history of atrial fibrillation on Eliquis, diabetes mellitus type 2, hypertension, recent history of Klebsiella bacteremia secondary to cholecystitis who presents to the emergency department with complaints of upper abdominal pain, nausea, vomiting.  He was recently admitted to [facility] for similar complaint, found to have cholecystitis and underwent conservative management and was discharged to home.  He was evaluated at outside hospital where EKG was concerning for STEMI and was sent to the emergency department for cardiology evaluation.  EKG features resolved at the time he presented to [facility].  Troponins were normal, code stroke canceled.  On presentation he was hemodynamically stable.  He was found to have elevated liver enzymes, bilirubin and lipase of 3987.  He was found to have leukocytosis.  Ultrasound of the abdomen showed gallbladder sludge with possible stones with gallbladder wall thickening and positive sonographic Murphy sign.  Patient was admitted for management of acute biliary pancreatitis.  General surgery consulted and plan is for cholecystectomy  Assessment & Plan:   Principal Problem:   Acute cholecystitis Active Problems:   Diabetes mellitus without complication (HCC)   Atrial fibrillation (HCC)   Acute biliary pancreatitis   Biliary pancreatitis/acute cholecystitis: Presented with upper abdominal pain, nausea and vomiting.  Presented with elevated lipase, elevated liver enzymes, elevated white cell counts, elevated bilirubin.  Ultrasound showed acute cholecystitis with no biliary dilation. General surgery consulted.  Plan for cholecystectomy.  Continue bowel rest, IV fluids, antibiotics, antiemetics, pain management. General surgery requested for  cardiology clearance before surgery  Paroxysmal A. fib: Currently in normal sinus rhythm.CHADS-VASc at least 3 (age x2, DM)  on amiodarone and metoprolol.  Eliquis on hold for upcoming surgery.  Started on IV heparin  Diabetes type 2: Hemoglobin A1c was 9.9 in October 2021.  Continue to monitor blood sugars, continue sliding-scale insulin.         DVT prophylaxis:Heparin IV Code Status: Full Family Communication: None at bedside Status is: Inpatient  Remains inpatient appropriate because:Inpatient level of care appropriate due to severity of illness   Dispo: The patient is from: Home              Anticipated d/c is to: Home              Anticipated d/c date is: 2 days              Patient currently is not medically stable to d/c.   Consultants: None  Procedures:None  Antimicrobials:  Anti-infectives (From admission, onward)   Start     Dose/Rate Route Frequency Ordered Stop   01/02/20 0345  piperacillin-tazobactam (ZOSYN) IVPB 3.375 g        3.375 g 12.5 mL/hr over 240 Minutes Intravenous Every 8 hours 01/02/20 0343        Subjective:  Patient seen and examined at the bedside in the emergency department.  During my evaluation, he was hemodynamically stable.  He is abdomen pain is better but still there.  Did not complain of any nausea or vomiting.  Objective: Vitals:   01/02/20 0615 01/02/20 0630 01/02/20 0645 01/02/20 0758  BP: (!) 176/96 (!) 173/86 (!) 157/85 (!) 165/91  Pulse: 60 (!) 58 61 61  Resp: 13 12 12 15   Temp:   98 F (36.7 C)   TempSrc:   Oral  SpO2: 96% 97% 96% 96%  Weight:    82.6 kg  Height:    5\' 7"  (1.702 m)   No intake or output data in the 24 hours ending 01/02/20 0846 Filed Weights   01/02/20 0758  Weight: 82.6 kg    Examination:  General exam: Pleasant elderly male, overall comfortable  HEENT:PERRL,Oral mucosa moist, Ear/Nose normal on gross exam Respiratory system: Bilateral equal air entry, normal vesicular breath sounds, no  wheezes or crackles  Cardiovascular system: S1 & S2 heard, RRR. No JVD, murmurs, rubs, gallops or clicks. No pedal edema. Gastrointestinal system: Abdomen is mildly distended, soft and has generalized tenderness but more in the epigastric region/RUQ. No organomegaly or masses felt. Normal bowel sounds heard. Central nervous system: Alert and oriented. No focal neurological deficits. Extremities: No edema, no clubbing ,no cyanosis Skin: No rashes, lesions or ulcers,no icterus ,no pallor   Data Reviewed: I have personally reviewed following labs and imaging studies  CBC: Recent Labs  Lab 01/01/20 2347 01/02/20 0500  WBC 16.8* 13.3*  HGB 11.5* 11.4*  HCT 34.8* 34.8*  MCV 97.8 99.1  PLT 157 517   Basic Metabolic Panel: Recent Labs  Lab 01/01/20 2347 01/02/20 0500  NA 135 134*  K 4.3 4.5  CL 102 102  CO2 21* 20*  GLUCOSE 225* 232*  BUN 12 13  CREATININE 0.87 0.88  CALCIUM 8.6* 8.4*   GFR: Estimated Creatinine Clearance: 72.3 mL/min (by C-G formula based on SCr of 0.88 mg/dL). Liver Function Tests: Recent Labs  Lab 01/01/20 2347 01/02/20 0500  AST 220* 183*  ALT 160* 159*  ALKPHOS 80 76  BILITOT 3.7* 4.0*  PROT 6.7 6.7  ALBUMIN 3.5 3.4*   Recent Labs  Lab 01/01/20 2347 01/02/20 0500  LIPASE 3,987* 1,719*   No results for input(s): AMMONIA in the last 168 hours. Coagulation Profile: No results for input(s): INR, PROTIME in the last 168 hours. Cardiac Enzymes: No results for input(s): CKTOTAL, CKMB, CKMBINDEX, TROPONINI in the last 168 hours. BNP (last 3 results) No results for input(s): PROBNP in the last 8760 hours. HbA1C: No results for input(s): HGBA1C in the last 72 hours. CBG: Recent Labs  Lab 01/02/20 0558 01/02/20 0751  GLUCAP 221* 182*   Lipid Profile: Recent Labs    01/01/20 2347  CHOL 150  HDL 61  LDLCALC NOT CALCULATED  TRIG 39  CHOLHDL 2.5   Thyroid Function Tests: No results for input(s): TSH, T4TOTAL, FREET4, T3FREE, THYROIDAB  in the last 72 hours. Anemia Panel: No results for input(s): VITAMINB12, FOLATE, FERRITIN, TIBC, IRON, RETICCTPCT in the last 72 hours. Sepsis Labs: No results for input(s): PROCALCITON, LATICACIDVEN in the last 168 hours.  Recent Results (from the past 240 hour(s))  Respiratory Panel by RT PCR (Flu A&B, Covid) - Nasopharyngeal Swab     Status: None   Collection Time: 01/02/20  3:28 AM   Specimen: Nasopharyngeal Swab  Result Value Ref Range Status   SARS Coronavirus 2 by RT PCR NEGATIVE NEGATIVE Final    Comment: (NOTE) SARS-CoV-2 target nucleic acids are NOT DETECTED.  The SARS-CoV-2 RNA is generally detectable in upper respiratoy specimens during the acute phase of infection. The lowest concentration of SARS-CoV-2 viral copies this assay can detect is 131 copies/mL. A negative result does not preclude SARS-Cov-2 infection and should not be used as the sole basis for treatment or other patient management decisions. A negative result may occur with  improper specimen collection/handling, submission of specimen other than nasopharyngeal swab,  presence of viral mutation(s) within the areas targeted by this assay, and inadequate number of viral copies (<131 copies/mL). A negative result must be combined with clinical observations, patient history, and epidemiological information. The expected result is Negative.  Fact Sheet for Patients:  PinkCheek.be  Fact Sheet for Healthcare Providers:  GravelBags.it  This test is no t yet approved or cleared by the [place] FDA and  has been authorized for detection and/or diagnosis of SARS-CoV-2 by FDA under an Emergency Use Authorization (EUA). This EUA will remain  in effect (meaning this test can be used) for the duration of the COVID-19 declaration under Section 564(b)(1) of the Act, 21 U.S.C. section 360bbb-3(b)(1), unless the authorization is terminated or revoked  sooner.     Influenza A by PCR NEGATIVE NEGATIVE Final   Influenza B by PCR NEGATIVE NEGATIVE Final    Comment: (NOTE) The Xpert Xpress SARS-CoV-2/FLU/RSV assay is intended as an aid in  the diagnosis of influenza from Nasopharyngeal swab specimens and  should not be used as a sole basis for treatment. Nasal washings and  aspirates are unacceptable for Xpert Xpress SARS-CoV-2/FLU/RSV  testing.  Fact Sheet for Patients: PinkCheek.be  Fact Sheet for Healthcare Providers: GravelBags.it  This test is not yet approved or cleared by the [place] FDA and  has been authorized for detection and/or diagnosis of SARS-CoV-2 by  FDA under an Emergency Use Authorization (EUA). This EUA will remain  in effect (meaning this test can be used) for the duration of the  Covid-19 declaration under Section 564(b)(1) of the Act, 21  U.S.C. section 360bbb-3(b)(1), unless the authorization is  terminated or revoked. Performed at [facility], [place] [address], [place], [place] [address]          Radiology Studies: US Abdomen Limited RUQ (LIVER/GB)  Result Date: 01/02/2020 CLINICAL DATA:  Initial evaluation for acute abdominal pain, elevated bilirubin, lipase, and white blood cell count. EXAM: ULTRASOUND ABDOMEN LIMITED RIGHT UPPER QUADRANT COMPARISON:  Prior MRI from 12/06/2019 FINDINGS: Gallbladder: Scattered echogenic material seen within the gallbladder lumen, consistent with sludge. Few scattered small stones versus tumefactive sludge measuring up to 8 mm noted. Gallbladder wall appears thickened up to approximately 8 mm. No free pericholecystic fluid. A positive sonographic Murphy sign was elicited on exam. Common bile duct: Diameter: 5.6 mm Liver: No focal lesion identified. Within normal limits in parenchymal echogenicity. Portal vein is patent on color Doppler imaging with normal direction of blood flow towards the liver. Other:  Small volume free fluid present within the right upper quadrant. IMPRESSION: 1. Gallbladder sludge and possible stones with associated gallbladder wall thickening and positive sonographic Murphy sign. Clinical correlation for possible acute cholecystitis recommended. 2. No biliary dilatation.  No visible choledocholithiasis. 3. Small volume free fluid within the right upper quadrant. Electronically Signed   By: Jeannine Boga M.D.   On: 01/02/2020 01:48        Scheduled Meds: . amiodarone  200 mg Oral Daily  . insulin aspart  0-9 Units Subcutaneous Q4H  . metoprolol succinate  50 mg Oral Daily  . sodium chloride flush  3 mL Intravenous Q12H   Continuous Infusions: . sodium chloride 110 mL/hr at 01/02/20 0448  . famotidine (PEPCID) IV    . heparin 1,100 Units/hr (01/02/20 0530)  . piperacillin-tazobactam (ZOSYN)  IV Stopped (01/02/20 0833)     LOS: 0 days    Time spent:35 mins, More than 50% of that time was spent in counseling and/or coordination  of care.      Shelly Coss, MD [facility] P11/09/2019, 8:46 AM

## 2020-01-02 NOTE — Progress Notes (Signed)
Pharmacy Antibiotic Note  Andrew Humphrey is a 79 y.o. male admitted on 01/01/2020 with intra-abdominal infection.  Pharmacy has been consulted for Zosyn dosing. ED to ED transfer from outside hospital. WBC elevated. Renal function good. Surgery following.   Plan: Zosyn 3.375G IV q8h to be infused over 4 hours  Trend WBC, temp, renal function  F/U infectious work-up  Temp (24hrs), Avg:97.9 F (36.6 C), Min:97.9 F (36.6 C), Max:97.9 F (36.6 C)  Recent Labs  Lab 01/01/20 2347  WBC 16.8*  CREATININE 0.87    Estimated Creatinine Clearance: 73.1 mL/min (by C-G formula based on SCr of 0.87 mg/dL).    No Known Allergies  Narda Bonds, PharmD, BCPS Clinical Pharmacist Phone: [phone]

## 2020-01-02 NOTE — ED Notes (Signed)
Vital signs stable. 

## 2020-01-02 NOTE — Consult Note (Addendum)
Cardiology Consultation:   Patient ID: KIRON OSMUN MRN: [id number]; DOB: March 08, 1940  Admit date: 01/01/2020 Date of Consult: 01/02/2020  Primary Care Provider: Lanelle Bal, [facility] Cardiologist: Rozann Lesches, MD  [place] Electrophysiologist:  None    Patient Profile:   Andrew Humphrey is a 79 y.o. male with a hx of PAF, HTN, HLD, GI bleed, and DM who is being seen today for the evaluation of preop evaluation at the request of Dr. Tawanna Solo.  History of Present Illness:   Andrew Humphrey is a 79 yo male with PMH noted above. He is now followed by Dr. Domenic Polite as an outpatient.  Notes indicate he was hospitalized back in April 2019 with rapid atrial fibrillation in association with chest pain and troponin elevations.  He was placed on IV heparin at that time along with IV developed and spontaneously converted to sinus rhythm.  He was managed with Toprol-XL and Eliquis.  He was lost to follow-up.  In point reported he was taken off Eliquis subsequently by his PCP in light of progressive anemia and suspected GI bleeding.  He presented back to [facility], ED on 12/08/2019 with abdominal pain and was admitted with suspected cholecystitis along with aspiration pneumonitis.  Presented with fevers, hypotension and recurrent abdominal pain with cramping and rigors.  He was evaluated by the surgical team with plans for medical therapy, and was treated with antibiotics.  During that admission while using the bathroom he developed onset of rapid atrial fibrillation.  He was placed on diltiazem drip at 15 mg/h.  He was evaluated by Dr. Domenic Polite.  Given he remained in rapid A. fib he was started on IV amiodarone with plans to continue short-term.  Converted to sinus rhythm.  He was discharged home on amiodarone 200 mg twice daily along with Eliquis 5 mg twice daily.  Echo that admission showed normal EF with mild distal septal hypokinesis.  He was seen in follow-up on 12/19/2019 with  Andrew Humphrey, [place].  At this office visit main complaint was weakness.  Noted to remain in sinus rhythm at this visit.  Amiodarone was decreased to 200 mg daily and continue metoprolol 50 mg daily, along with Eliquis.   He presented to [facility] with onset of acute epigastric pain with nausea and vomiting.  EKG was initially concerning for STEMI and he was sent as an ED to ED transfer for interventional cardiology evaluation.  EKG showed lateral ST elevation with reciprocal ST depression.  On arrival he was evaluated by Dr. Burt Knack.  Repeat EKG shows sinus rhythm with resolution of ST segment changes.  Limited bedside echo showed normal LV function with no regional wall motion abnormalities.  It was felt his clinical presentation was more consistent with cholecystitis.  Labs on admission were notable for stable electrolytes, AST 220, ALT 160, total bilirubin 3.7, lipase 3987, WBC 16, high-sensitivity troponin negative x2.  Abd ultrasound showed gallbladder sludge with possible stones and gallbladder wall thickening with positive sonographic Murphy sign.  Surgery was consulted in the ED with plans for cholecystectomy.  Cardiology has been asked to weigh in regarding preop evaluation.  With the patient he reports being generally healthy prior to admission other than Afib and recent gallbladder issues.  He active around his home, denies any anginal symptoms with day-to-day activity.  He is able to complete 4 METS of activity without anginal symptoms.   Past Medical History:  Diagnosis Date  . Anemia   . Essential hypertension   .  History of atrial fibrillation   . History of GI bleed   . Type 2 diabetes mellitus ([place])     Past Surgical History:  Procedure Laterality Date  . COLONOSCOPY N/A 11/09/2016   Procedure: COLONOSCOPY;  Surgeon: Danie Binder, MD;  Location: [facility];  Service: Endoscopy;  Laterality: N/A;  . [place]  . TONSILLECTOMY       Home Medications:   Prior to Admission medications   Medication Sig Start Date End Date Taking? Authorizing Provider  amiodarone (PACERONE) 200 MG tablet Take 1 tablet (200 mg total) by mouth daily. 12/19/19  Yes Kilroy, Doreene Burke, PA-C  apixaban (ELIQUIS) 5 MG TABS tablet Take 1 tablet (5 mg total) by mouth 2 (two) times daily. 12/28/19 01/27/20 Yes Satira Sark, MD  glipiZIDE (GLUCOTROL XL) 10 MG 24 hr tablet Take 20 mg by mouth daily. 11/21/19  Yes [provider]  metFORMIN (GLUCOPHAGE) 1000 MG tablet Take 1,000 mg by mouth in the morning and at bedtime.  06/12/17  Yes [provider]  metoprolol succinate (TOPROL XL) 50 MG 24 hr tablet Take 1 tablet (50 mg total) by mouth daily. Take with or immediately following a meal. 12/19/19  Yes Kilroy, Doreene Burke, PA-C  omeprazole (PRILOSEC) 40 MG capsule Take 40 mg by mouth daily as needed (indigestion).  11/21/19  Yes [provider]    Inpatient Medications: Scheduled Meds: . amiodarone  200 mg Oral Daily  . insulin aspart  0-9 Units Subcutaneous Q4H  . metoprolol succinate  50 mg Oral Daily  . sodium chloride flush  3 mL Intravenous Q12H   Continuous Infusions: . famotidine (PEPCID) IV Stopped (01/02/20 0947)  . heparin 1,100 Units/hr (01/02/20 0530)  . lactated ringers 100 mL/hr at 01/02/20 1538  . piperacillin-tazobactam (ZOSYN)  IV 3.375 g (01/02/20 1325)   PRN Meds: acetaminophen **OR** acetaminophen, HYDROmorphone (DILAUDID) injection, ondansetron **OR** ondansetron (ZOFRAN) IV  Allergies:   No Known Allergies  Social History:   Social History   Socioeconomic History  . Marital status: Single    Spouse name: Not on file  . Number of children: Not on file  . Years of education: Not on file  . Highest education level: Not on file  Occupational History  . Not on file  Tobacco Use  . Smoking status: Never Smoker  . Smokeless tobacco: Never Used  Substance and Sexual Activity  . Alcohol use: Yes    Comment: occasional  beer  . Drug use: No  . Sexual activity: Not on file  Other Topics Concern  . Not on file  Social History Narrative  . Not on file   Social Determinants of Health   Financial Resource Strain:   . Difficulty of Paying Living Expenses: Not on file  Food Insecurity:   . Worried About Charity fundraiser in the Last Year: Not on file  . Ran Out of Food in the Last Year: Not on file  Transportation Needs:   . Lack of Transportation (Medical): Not on file  . Lack of Transportation (Non-Medical): Not on file  Physical Activity:   . Days of Exercise per Week: Not on file  . Minutes of Exercise per Session: Not on file  Stress:   . Feeling of Stress : Not on file  Social Connections:   . Frequency of Communication with Friends and Family: Not on file  . Frequency of Social Gatherings with Friends and Family: Not on  file  . Attends Religious Services: Not on file  . Active Member of Clubs or Organizations: Not on file  . Attends Archivist Meetings: Not on file  . Marital Status: Not on file  Intimate Partner Violence:   . Fear of Current or Ex-Partner: Not on file  . Emotionally Abused: Not on file  . Physically Abused: Not on file  . Sexually Abused: Not on file    Family History:    Family History  Problem Relation Age of Onset  . Hypertension Father   . Colon cancer Neg Hx   . Colon polyps Neg Hx      ROS:  Please see the history of present illness.   All other ROS reviewed and negative.     Physical Exam/Data:   Vitals:   01/02/20 1330 01/02/20 1400 01/02/20 1500 01/02/20 1530  BP: (!) 171/91 (!) 158/87 (!) 168/89 (!) 174/93  Pulse: 63 61 63 66  Resp: 12 14 14 16   Temp:      TempSrc:      SpO2: 97% 96% 96% 94%  Weight:      Height:        Intake/Output Summary (Last 24 hours) at 01/02/2020 1609 Last data filed at 01/02/2020 1333 Gross per 24 hour  Intake --  Output 200 ml  Net -200 ml   Last 3 Weights 01/02/2020 12/19/2019 12/09/2019  Weight  (lbs) 182 lb 3.2 oz 182 lb 3.2 oz 202 lb 13.2 oz  Weight (kg) 82.645 kg 82.645 kg 92 kg     Body mass index is 28.54 kg/m.  General:  Well nourished, well developed, in no acute distress HEENT: normal Lymph: no adenopathy Neck: no JVD Endocrine:  No thryomegaly Vascular: No carotid bruits; FA pulses 2+ bilaterally without bruits  Cardiac:  normal S1, S2; RRR; no murmur  Lungs:  clear to auscultation bilaterally, no wheezing, rhonchi or rales  Abd: soft, generalized tenderness, no hepatomegaly  Ext: no edema Musculoskeletal:  No deformities, BUE and BLE strength normal and equal Skin: warm and dry  Neuro:  CNs 2-12 intact, no focal abnormalities noted Psych:  Normal affect   EKG:  The EKG was personally reviewed and demonstrates:  SR with IVCD, nonspecific T wave changes  Relevant CV Studies:  Echo: 11/2019   IMPRESSIONS    1. MIld distal septal hypokinesis. . Left ventricular ejection fraction,  by estimation, is 55 to 60%. The left ventricle has normal function. The  left ventricle demonstrates regional wall motion abnormalities (see  scoring diagram/findings for  description). There is mild left ventricular hypertrophy. Left ventricular  diastolic parameters were normal.  2. Right ventricular systolic function is mildly reduced. The right  ventricular size is normal. There is mildly elevated pulmonary artery  systolic pressure.  3. Left atrial size was mild to moderately dilated.  4. The mitral valve is abnormal. Moderate mitral valve regurgitation.  5. The aortic valve is abnormal. Aortic valve regurgitation is trivial.  Mild to moderate aortic valve sclerosis/calcification is present, without  any evidence of aortic stenosis.  6. The inferior vena cava is dilated in size with <50% respiratory  variability, suggesting right atrial pressure of 15 mmHg.   Laboratory Data:  High Sensitivity Troponin:   Recent Labs  Lab 12/05/19 1231 12/08/19 0149  12/08/19 0451 01/01/20 2347 01/02/20 0207  TROPONINIHS 6 123* 100* 5 4     Chemistry Recent Labs  Lab 01/01/20 2347 01/02/20 0500  NA 135 134*  K  4.3 4.5  CL 102 102  CO2 21* 20*  GLUCOSE 225* 232*  BUN 12 13  CREATININE 0.87 0.88  CALCIUM 8.6* 8.4*  GFRNONAA >60 >60  ANIONGAP 12 12    Recent Labs  Lab 01/01/20 2347 01/02/20 0500  PROT 6.7 6.7  ALBUMIN 3.5 3.4*  AST 220* 183*  ALT 160* 159*  ALKPHOS 80 76  BILITOT 3.7* 4.0*   Hematology Recent Labs  Lab 01/01/20 2347 01/02/20 0500  WBC 16.8* 13.3*  RBC 3.56* 3.51*  HGB 11.5* 11.4*  HCT 34.8* 34.8*  MCV 97.8 99.1  MCH 32.3 32.5  MCHC 33.0 32.8  RDW 20.0* 20.2*  PLT 157 161   BNPNo results for input(s): BNP, PROBNP in the last 168 hours.  DDimer No results for input(s): DDIMER in the last 168 hours.   Radiology/Studies:  US Abdomen Limited RUQ (LIVER/GB)  Result Date: 01/02/2020 CLINICAL DATA:  Initial evaluation for acute abdominal pain, elevated bilirubin, lipase, and white blood cell count. EXAM: ULTRASOUND ABDOMEN LIMITED RIGHT UPPER QUADRANT COMPARISON:  Prior MRI from 12/06/2019 FINDINGS: Gallbladder: Scattered echogenic material seen within the gallbladder lumen, consistent with sludge. Few scattered small stones versus tumefactive sludge measuring up to 8 mm noted. Gallbladder wall appears thickened up to approximately 8 mm. No free pericholecystic fluid. A positive sonographic Murphy sign was elicited on exam. Common bile duct: Diameter: 5.6 mm Liver: No focal lesion identified. Within normal limits in parenchymal echogenicity. Portal vein is patent on color Doppler imaging with normal direction of blood flow towards the liver. Other: Small volume free fluid present within the right upper quadrant. IMPRESSION: 1. Gallbladder sludge and possible stones with associated gallbladder wall thickening and positive sonographic Murphy sign. Clinical correlation for possible acute cholecystitis recommended. 2. No  biliary dilatation.  No visible choledocholithiasis. 3. Small volume free fluid within the right upper quadrant. Electronically Signed   By: Jeannine Boga M.D.   On: 01/02/2020 01:48     Assessment and Plan:   Andrew Humphrey is a 79 y.o. male with a hx of PAF, HTN, HLD, GI bleed, and DM who is being seen today for the evaluation of preop evaluation at the request of Dr. Tawanna Solo.  1. Preop evaluation: Presented initially to [facility] on 10/15 with abdominal pain and noted to have cholecystitis which was managed medically.  Now presents back with worsening ongoing abdominal pain with vomiting.  Significantly elevated lipase.  Has been seen by general surgery with plans for cholecystectomy.  In regards to his cardiac history he has a diagnosis of paroxysmal atrial fibrillation.  This initially was noted in 2019.  Most recently again during his [facility] admission.  He has been managed with amiodarone, Toprol and Eliquis prior to admission.  He has not had any anginal symptoms prior to admission.  Recent echo with normal EF, mild distal septal hypokinesis.  High-sensitivity troponin negative x2. EKG at outside hospital with lateral ST elevation with reciprocal changes, but resolved on repeat EKG in the ER at [facility]. He is able to complete 4 METS. Will review further with MD.   2.  Biliary pancreatitis/acute cholecystitis: Seen by general surgery with plan for cholecystectomy.  Has been started on IV fluids, antibiotics and pain management.  3.  Paroxysmal atrial fibrillation: Currently in sinus rhythm.  His Eliquis is on hold with plans for upcoming surgery. --Currently on IV heparin  CHA2DS2-VASc Score = 4  This indicates a 4.8% annual risk of stroke. The patient's score  is based upon: CHF History: 0 HTN History: 1 Diabetes History: 1 Stroke History: 0 Vascular Disease History: 0 Age Score: 2 Gender Score: 0   4. DM: Hgb A1c 9.9 -- SSI while inpatient  For questions or  updates, please contact [place] Please consult [email or web address] for contact info under    Signed, Reino Bellis, NP  01/02/2020 4:09 PM   History and all data above reviewed.  Patient examined.  I agree with the findings as above.  The patient presents with epigastric pain, nausea and vomiting.   He has acute cholecystitis and no diagnostic acute EKG changes.  High sensitivity troponin with repeat has been 4 and 5.  He does not currently describe chest , neck or arm pain.  He has abdominal pain with palpation and ultrasound evidence of acute cholecystitis.   Lipase and liver enzymes are elevated.  At home he is functional and walks a 25 yard 20 percent incline to the mailbox daily without chest pain or SOB. He has had atrial fib but none per his report lately.   The patient exam reveals COR:RRR, systolic murmur radiating out the aortic outflow tract   ,  Lungs: Clear  ,  Abd: Mildly distended with decreased bowel sounds and tenderness to palpation. , Ext No edema  .  All available labs, radiology testing, previous records reviewed. Agree with documented assessment and plan.   PREOP:  The patient is at acceptable risk for the planned surgery.  He has no high risk findings and has a high functional level and this is not a high risk procedure.  No further testing is indicated.  He can stop his heparin for the surgery and resume DOAC when OK with the surgeons.  ATRIAL FIB:  Maintaining NSR.  Continue amiodarone.  Continue previous meds when taking PO.  HTN :It looks like his PO meds have been ordered. If he is not taking PO meds I would suggest hydralazine IV q6 hours for BP control.     Jeneen Rinks Merrily Tegeler  4:15 PM  01/02/2020

## 2020-01-02 NOTE — Progress Notes (Signed)
ANTICOAGULATION CONSULT NOTE - Initial Consult  Pharmacy Consult for Heparin (Apixaban on hold) Indication: atrial fibrillation  No Known Allergies   Vital Signs: Temp: 97.9 F (36.6 C) (11/07 2336) Temp Source: Temporal (11/07 2336) BP: 166/93 (11/08 0500) Pulse Rate: 61 (11/08 0500)  Labs: Recent Labs    01/01/20 2347 01/02/20 0207  HGB 11.5*  --   HCT 34.8*  --   PLT 157  --   CREATININE 0.87  --   TROPONINIHS 5 4    Estimated Creatinine Clearance: 73.1 mL/min (by C-G formula based on SCr of 0.87 mg/dL).   Medical History: Past Medical History:  Diagnosis Date  . Anemia   . Essential hypertension   . History of atrial fibrillation   . History of GI bleed   . Type 2 diabetes mellitus [facility])     Assessment: 79 y/o M transfer from outside hospital with intra-abdomina infection. He is on apixaban PTA for afib. Holding apixaban and starting heparin as pt will likely need surgery or procedure this admission. It has been >12 hours since last apixaban dose, will start heparin now. Anticipate using aPTT to dose for now.   Note, a 4000 unit heparin bolus was given at the outside hospital hours ago as the patient was initially thought to be a STEMI  Goal of Therapy:  Heparin level 0.3-0.7 units/ml aPTT 66-102 seconds Monitor platelets by anticoagulation protocol: Yes   Plan:  -Start heparin drip at 1100 units/hr -1400 heparin level/aPTT -Daily CBC/heparin level/aPTT -Monitor for bleeding  Narda Bonds, PharmD, BCPS Clinical Pharmacist Phone: [phone]

## 2020-01-03 DIAGNOSIS — K810 Acute cholecystitis: Secondary | ICD-10-CM | POA: Diagnosis not present

## 2020-01-03 DIAGNOSIS — I480 Paroxysmal atrial fibrillation: Secondary | ICD-10-CM | POA: Diagnosis not present

## 2020-01-03 LAB — GLUCOSE, CAPILLARY
Glucose-Capillary: 107 mg/dL — ABNORMAL HIGH (ref 70–99)
Glucose-Capillary: 113 mg/dL — ABNORMAL HIGH (ref 70–99)
Glucose-Capillary: 125 mg/dL — ABNORMAL HIGH (ref 70–99)
Glucose-Capillary: 122 mg/dL — ABNORMAL HIGH (ref 70–99)
Glucose-Capillary: 100 mg/dL — ABNORMAL HIGH (ref 70–99)
Glucose-Capillary: 106 mg/dL — ABNORMAL HIGH (ref 70–99)

## 2020-01-03 LAB — COMPREHENSIVE METABOLIC PANEL
ALT: 102 U/L — ABNORMAL HIGH (ref 0–44)
AST: 76 U/L — ABNORMAL HIGH (ref 15–41)
Albumin: 3 g/dL — ABNORMAL LOW (ref 3.5–5.0)
Alkaline Phosphatase: 79 U/L (ref 38–126)
Anion gap: 10 (ref 5–15)
BUN: 11 mg/dL (ref 8–23)
CO2: 23 mmol/L (ref 22–32)
Calcium: 7.9 mg/dL — ABNORMAL LOW (ref 8.9–10.3)
Chloride: 102 mmol/L (ref 98–111)
Creatinine, Ser: 0.81 mg/dL (ref 0.61–1.24)
GFR, Estimated: >60 mL/min (ref >60)
Glucose, Bld: 124 mg/dL — ABNORMAL HIGH (ref 70–99)
Potassium: 3.7 mmol/L (ref 3.5–5.1)
Sodium: 135 mmol/L (ref 135–145)
Total Bilirubin: 1.6 mg/dL — ABNORMAL HIGH (ref 0.3–1.2)
Total Protein: 6 g/dL — ABNORMAL LOW (ref 6.5–8.1)

## 2020-01-03 LAB — CBC
HCT: 32.5 % — ABNORMAL LOW (ref 39.0–52.0)
Hemoglobin: 10.7 g/dL — ABNORMAL LOW (ref 13.0–17.0)
MCH: 32.1 pg (ref 26.0–34.0)
MCHC: 32.9 g/dL (ref 30.0–36.0)
MCV: 97.6 fL (ref 80.0–100.0)
Platelets: 130 10*3/uL — ABNORMAL LOW (ref 150–400)
RBC: 3.33 MIL/uL — ABNORMAL LOW (ref 4.22–5.81)
RDW: 20.3 % — ABNORMAL HIGH (ref 11.5–15.5)
WBC: 11.1 10*3/uL — ABNORMAL HIGH (ref 4.0–10.5)
nRBC: 0 % (ref 0.0–0.2)

## 2020-01-03 LAB — HEPARIN LEVEL (UNFRACTIONATED): Heparin Unfractionated: 0.36 IU/mL (ref 0.30–0.70)

## 2020-01-03 LAB — LIPASE, BLOOD: Lipase: 663 U/L — ABNORMAL HIGH (ref 11–51)

## 2020-01-03 MED ORDER — OXYCODONE HCL 5 MG PO TABS: 5.0000 mg | ORAL_TABLET | ORAL | Status: DC | PRN

## 2020-01-03 MED ORDER — SODIUM CHLORIDE 0.9 % IV SOLN: INTRAVENOUS | Status: DC

## 2020-01-03 NOTE — Progress Notes (Addendum)
Progress Note  Patient Name: Andrew Humphrey Date of Encounter: 01/03/2020  Primary Cardiologist:  Rozann Lesches, MD  Subjective   No chest pain, pancreas too inflamed to operate today  Inpatient Medications    Scheduled Meds: . amiodarone  200 mg Oral Daily  . insulin aspart  0-9 Units Subcutaneous Q4H  . metoprolol succinate  50 mg Oral Daily  . sodium chloride flush  3 mL Intravenous Q12H   Continuous Infusions: . famotidine (PEPCID) IV Stopped (01/02/20 2131)  . heparin 1,100 Units/hr (01/03/20 0508)  . lactated ringers 100 mL/hr at 01/03/20 0100  . piperacillin-tazobactam (ZOSYN)  IV 3.375 g (01/03/20 0509)   PRN Meds: acetaminophen **OR** acetaminophen, HYDROmorphone (DILAUDID) injection, ondansetron **OR** ondansetron (ZOFRAN) IV   Vital Signs    Vitals:   01/02/20 1830 01/02/20 1900 01/02/20 1930 01/03/20 0412  BP: (!) 161/85 (!) 153/78 (!) 143/67 (!) 143/81  Pulse: 60 63 64 71  Resp: 13 18 17 20   Temp:   98 F (36.7 C) 98.7 F (37.1 C)  TempSrc:   Temporal Oral  SpO2: 95% 97% 96% 95%  Weight:      Height:        Intake/Output Summary (Last 24 hours) at 01/03/2020 0834 Last data filed at 01/03/2020 0400 Gross per 24 hour  Intake 1254.54 ml  Output 400 ml  Net 854.54 ml   Filed Weights   01/02/20 0758  Weight: 82.6 kg   Last Weight  Most recent update: 01/02/2020  7:58 AM   Weight  82.6 kg (182 lb 3.2 oz)           Weight change:    Telemetry    SR - Personally Reviewed  ECG    None today - Personally Reviewed  Physical Exam   General: Well developed, well nourished, male appearing in no acute distress. Head: Normocephalic, atraumatic.  Neck: Supple without bruits, JVD not elevated. Lungs:  Resp regular and unlabored, few rales bases Heart: RRR, S1, S2, no S3, S4, 2/6 murmur; no rub. Abdomen: firm, tender, distended with normoactive bowel sounds. No hepatomegaly. No rebound/guarding. No obvious abdominal masses. Extremities:  No clubbing, cyanosis, no edema. Distal pedal pulses are 2+ bilaterally. Neuro: Alert and oriented X 3. Moves all extremities spontaneously. Psych: Normal affect.  Labs    Hematology Recent Labs  Lab 01/01/20 2347 01/02/20 0500 01/03/20 0226  WBC 16.8* 13.3* 11.1*  RBC 3.56* 3.51* 3.33*  HGB 11.5* 11.4* 10.7*  HCT 34.8* 34.8* 32.5*  MCV 97.8 99.1 97.6  MCH 32.3 32.5 32.1  MCHC 33.0 32.8 32.9  RDW 20.0* 20.2* 20.3*  PLT 157 161 130*    Chemistry Recent Labs  Lab 01/01/20 2347 01/02/20 0500 01/03/20 0226  NA 135 134* 135  K 4.3 4.5 3.7  CL 102 102 102  CO2 21* 20* 23  GLUCOSE 225* 232* 124*  BUN 12 13 11   CREATININE 0.87 0.88 0.81  CALCIUM 8.6* 8.4* 7.9*  PROT 6.7 6.7 6.0*  ALBUMIN 3.5 3.4* 3.0*  AST 220* 183* 76*  ALT 160* 159* 102*  ALKPHOS 80 76 79  BILITOT 3.7* 4.0* 1.6*  GFRNONAA >60 >60 >60  ANIONGAP 12 12 10      High Sensitivity Troponin:   Recent Labs  Lab 12/05/19 1231 12/08/19 0149 12/08/19 0451 01/01/20 2347 01/02/20 0207  TROPONINIHS 6 123* 100* 5 4      BNPNo results for input(s): BNP, PROBNP in the last 168 hours.   DDimer No results for  input(s): DDIMER in the last 168 hours.   Radiology    US Abdomen Limited RUQ (LIVER/GB)  Result Date: 01/02/2020 CLINICAL DATA:  Initial evaluation for acute abdominal pain, elevated bilirubin, lipase, and white blood cell count. EXAM: ULTRASOUND ABDOMEN LIMITED RIGHT UPPER QUADRANT COMPARISON:  Prior MRI from 12/06/2019 FINDINGS: Gallbladder: Scattered echogenic material seen within the gallbladder lumen, consistent with sludge. Few scattered small stones versus tumefactive sludge measuring up to 8 mm noted. Gallbladder wall appears thickened up to approximately 8 mm. No free pericholecystic fluid. A positive sonographic Murphy sign was elicited on exam. Common bile duct: Diameter: 5.6 mm Liver: No focal lesion identified. Within normal limits in parenchymal echogenicity. Portal vein is patent on color  Doppler imaging with normal direction of blood flow towards the liver. Other: Small volume free fluid present within the right upper quadrant. IMPRESSION: 1. Gallbladder sludge and possible stones with associated gallbladder wall thickening and positive sonographic Murphy sign. Clinical correlation for possible acute cholecystitis recommended. 2. No biliary dilatation.  No visible choledocholithiasis. 3. Small volume free fluid within the right upper quadrant. Electronically Signed   By: Jeannine Boga M.D.   On: 01/02/2020 01:48     Cardiac Studies   ECHO:  12/09/2019 1. MIld distal septal hypokinesis. . Left ventricular ejection fraction,  by estimation, is 55 to 60%. The left ventricle has normal function. The  left ventricle demonstrates regional wall motion abnormalities (see  scoring diagram/findings for  description). There is mild left ventricular hypertrophy. Left ventricular  diastolic parameters were normal.  2. Right ventricular systolic function is mildly reduced. The right  ventricular size is normal. There is mildly elevated pulmonary artery  systolic pressure.  3. Left atrial size was mild to moderately dilated.  4. The mitral valve is abnormal. Moderate mitral valve regurgitation.  5. The aortic valve is abnormal. Aortic valve regurgitation is trivial.  Mild to moderate aortic valve sclerosis/calcification is present, without  any evidence of aortic stenosis.  6. The inferior vena cava is dilated in size with <50% respiratory  variability, suggesting right atrial pressure of 15 mmHg.   Patient Profile     79 y.o. male w/ hx PAF, HTN, HLD, GI bleed, and DM who was admitted 11/08 with acute cholecystitis (failed outpt med rx), lap choley planned.  Assessment & Plan    1. Acute cholecystitis - on ABX, Eliquis held, is NPO - mgt per CCS/IM, lab choley planned - LFTs are improving, but Calcium, albumin and protein are also decreasing, follow  2. Hx PAF -  maintaining SR - CHA2DS2-VASc = 4 (age x 2, DM, HTN) - OK to hold Eliquis, heparin till surgery, resume Eliquis after the surgery  Otherwise, per IM Principal Problem:   Acute cholecystitis Active Problems:   Diabetes mellitus without complication ([place])   Atrial fibrillation ([place])   Acute biliary pancreatitis    Jonetta Speak , PA-C 8:34 AM 01/03/2020 Pager: [phone]  History and all data above reviewed.  Patient examined.  I agree with the findings as above.  All available labs, radiology testing, previous records reviewed. Agree with documented assessment and plan. NSR.  Continue current meds.  No further cardiac work up.  We will follow after surgery.   Andrew Humphrey  12:04 PM  01/03/2020

## 2020-01-03 NOTE — Progress Notes (Signed)
Pt admitted to [facility], transported via wheelchair. Pt is A&Ox4 w/ no c/o pain and reports it is being well controlled w/ IV analgesic. Pt reports feelings of confusion and frustration over current plan of care. Provided active listening and re-informed pt on current POC. Educated pt on call bell use and fall risk safety plan. Pt verbalized understanding. Will continue to monitor.

## 2020-01-03 NOTE — Progress Notes (Signed)
PROGRESS NOTE    ARIS EVEN  IOE:[id number] DOB: Oct 30, 1940 DOA: 01/01/2020 PCP: Lanelle Bal, PA-C   Chief Complain: Abdominal pain, nausea, vomiting  Brief Narrative: Patient is a 79 year old male with history of atrial fibrillation on Eliquis, diabetes mellitus type 2, hypertension, recent history of Klebsiella bacteremia secondary to cholecystitis who presents to the emergency department with complaints of upper abdominal pain, nausea, vomiting.  He was [facility] for [facility], found to have cholecystitis and underwent conservative management and was discharged to home.  He was evaluated at outside hospital where EKG was concerning for STEMI and was sent to the emergency department for cardiology evaluation.  EKG features resolved at the time he presented to [facility].  Troponins were normal, code stroke canceled.  On presentation he was hemodynamically stable.  He was found to have elevated liver enzymes, bilirubin and lipase of 3987, leukocytosis.  Ultrasound of the abdomen showed gallbladder sludge with possible stones with gallbladder wall thickening and positive sonographic Murphy sign.  Patient was admitted for management of acute biliary pancreatitis.  General surgery consulted and plan is for cholecystectomy for resolution/improvement of pancreatitis.  Assessment & Plan:   Principal Problem:   Acute cholecystitis Active Problems:   Diabetes mellitus without complication (HCC)   Atrial fibrillation (HCC)   Acute biliary pancreatitis   Biliary pancreatitis: Presented with upper abdominal pain, nausea and vomiting.  Presented with elevated lipase, elevated liver enzymes, elevated white cell counts, elevated bilirubin.  Ultrasound showed acute cholecystitis with no biliary dilation. General surgery consulted.  Plan for cholecystectomy after further improvement in pancreatitis. continue bowel rest, IV fluids, antibiotics, antiemetics, pain  management. General surgery requested for cardiology clearance before surgery,cleared for surgery. Lipase and liver enzymes improving.   Paroxysmal A. fib: Currently in normal sinus rhythm.CHADS-VASc at least 3 (age x2, DM)  on amiodarone and metoprolol.  Eliquis on hold for upcoming surgery.  On IV heparin  Diabetes type 2: Hemoglobin A1c was 9.9 in October 2021.  Continue to monitor blood sugars, continue sliding-scale insulin.         DVT prophylaxis:Heparin IV Code Status: Full Family Communication: Wife at bedside Status is: Inpatient  Remains inpatient appropriate because:Inpatient level of care appropriate due to severity of illness   Dispo: The patient is from: Home              Anticipated d/c is to: Home              Anticipated d/c date is: 2 days              Patient currently is not medically stable to d/c.  Waiting for cholecystectomy  Consultants: None  Procedures:None  Antimicrobials:  Anti-infectives (From admission, onward)   Start     Dose/Rate Route Frequency Ordered Stop   01/02/20 0345  piperacillin-tazobactam (ZOSYN) IVPB 3.375 g        3.375 g 12.5 mL/hr over 240 Minutes Intravenous Every 8 hours 01/02/20 0343        Subjective:  Patient seen and examined the bedside this morning.  Hemodynamically stable.  Comfortable and  denies any abdomen pain, nausea or vomiting.  Objective: Vitals:   01/02/20 1900 01/02/20 1930 01/03/20 0412 01/03/20 0835  BP: (!) 153/78 (!) 143/67 (!) 143/81 134/79  Pulse: 63 64 71 72  Resp: 18 17 20 18   Temp:  98 F (36.7 C) 98.7 F (37.1 C) 98.7 F (37.1 C)  TempSrc:  Temporal Oral Oral  SpO2: 97% 96% 95% 94%  Weight:      Height:        Intake/Output Summary (Last 24 hours) at 01/03/2020 0814 Last data filed at 01/03/2020 0800 Gross per 24 hour  Intake 1254.54 ml  Output 400 ml  Net 854.54 ml   Filed Weights   01/02/20 0758  Weight: 82.6 kg    Examination:  General exam: Appears calm and  comfortable ,Not in distress,average built HEENT:PERRL,Oral mucosa moist, Ear/Nose normal on gross exam Respiratory system: Bilateral equal air entry, normal vesicular breath sounds, no wheezes or crackles  Cardiovascular system: S1 & S2 heard, RRR. No JVD, murmurs, rubs, gallops or clicks. Gastrointestinal system: Abdomen is is mildly distended, soft and has very mild tenderness in the epigastric region. No organomegaly or masses felt. Normal bowel sounds heard. Central nervous system: Alert and oriented. No focal neurological deficits. Extremities: No edema, no clubbing ,no cyanosis, distal peripheral pulses palpable. Skin: No rashes, lesions or ulcers,no icterus ,no pallor    Data Reviewed: I have personally reviewed following labs and imaging studies  CBC: Recent Labs  Lab 01/01/20 2347 01/02/20 0500 01/03/20 0226  WBC 16.8* 13.3* 11.1*  HGB 11.5* 11.4* 10.7*  HCT 34.8* 34.8* 32.5*  MCV 97.8 99.1 97.6  PLT 157 161 481*   Basic Metabolic Panel: Recent Labs  Lab 01/01/20 2347 01/02/20 0500 01/03/20 0226  NA 135 134* 135  K 4.3 4.5 3.7  CL 102 102 102  CO2 21* 20* 23  GLUCOSE 225* 232* 124*  BUN 12 13 11   CREATININE 0.87 0.88 0.81  CALCIUM 8.6* 8.4* 7.9*   GFR: Estimated Creatinine Clearance: 78.5 mL/min (by C-G formula based on SCr of 0.81 mg/dL). Liver Function Tests: Recent Labs  Lab 01/01/20 2347 01/02/20 0500 01/03/20 0226  AST 220* 183* 76*  ALT 160* 159* 102*  ALKPHOS 80 76 79  BILITOT 3.7* 4.0* 1.6*  PROT 6.7 6.7 6.0*  ALBUMIN 3.5 3.4* 3.0*   Recent Labs  Lab 01/01/20 2347 01/02/20 0500 01/03/20 0226  LIPASE 3,987* 1,719* 663*   No results for input(s): AMMONIA in the last 168 hours. Coagulation Profile: No results for input(s): INR, PROTIME in the last 168 hours. Cardiac Enzymes: No results for input(s): CKTOTAL, CKMB, CKMBINDEX, TROPONINI in the last 168 hours. BNP (last 3 results) No results for input(s): PROBNP in the last 8760  hours. HbA1C: No results for input(s): HGBA1C in the last 72 hours. CBG: Recent Labs  Lab 01/02/20 1523 01/02/20 1947 01/03/20 0120 01/03/20 0402 01/03/20 0832  GLUCAP 119* 104* 107* 113* 125*   Lipid Profile: Recent Labs    01/01/20 2347  CHOL 150  HDL 61  LDLCALC NOT CALCULATED  TRIG 39  CHOLHDL 2.5   Thyroid Function Tests: No results for input(s): TSH, T4TOTAL, FREET4, T3FREE, THYROIDAB in the last 72 hours. Anemia Panel: No results for input(s): VITAMINB12, FOLATE, FERRITIN, TIBC, IRON, RETICCTPCT in the last 72 hours. Sepsis Labs: No results for input(s): PROCALCITON, LATICACIDVEN in the last 168 hours.  Recent Results (from the past 240 hour(s))  Respiratory Panel by RT PCR (Flu A&B, Covid) - Nasopharyngeal Swab     Status: None   Collection Time: 01/02/20  3:28 AM   Specimen: Nasopharyngeal Swab  Result Value Ref Range Status   SARS Coronavirus 2 by RT PCR NEGATIVE NEGATIVE Final    Comment: (NOTE) SARS-CoV-2 target nucleic acids are NOT DETECTED.  The SARS-CoV-2 RNA is generally detectable in upper respiratoy specimens during the acute  phase of infection. The lowest concentration of SARS-CoV-2 viral copies this assay can detect is 131 copies/mL. A negative result does not preclude SARS-Cov-2 infection and should not be used as the sole basis for treatment or other patient management decisions. A negative result may occur with  improper specimen collection/handling, submission of specimen other than nasopharyngeal swab, presence of viral mutation(s) within the areas targeted by this assay, and inadequate number of viral copies (<131 copies/mL). A negative result must be combined with clinical observations, patient history, and epidemiological information. The expected result is Negative.  Fact Sheet for Patients:  PinkCheek.be  Fact Sheet for Healthcare Providers:  GravelBags.it  This test is no  t yet approved or cleared by the [place] FDA and  has been authorized for detection and/or diagnosis of SARS-CoV-2 by FDA under an Emergency Use Authorization (EUA). This EUA will remain  in effect (meaning this test can be used) for the duration of the COVID-19 declaration under Section 564(b)(1) of the Act, 21 U.S.C. section 360bbb-3(b)(1), unless the authorization is terminated or revoked sooner.     Influenza A by PCR NEGATIVE NEGATIVE Final   Influenza B by PCR NEGATIVE NEGATIVE Final    Comment: (NOTE) The Xpert Xpress SARS-CoV-2/FLU/RSV assay is intended as an aid in  the diagnosis of influenza from Nasopharyngeal swab specimens and  should not be used as a sole basis for treatment. Nasal washings and  aspirates are unacceptable for Xpert Xpress SARS-CoV-2/FLU/RSV  testing.  Fact Sheet for Patients: PinkCheek.be  Fact Sheet for Healthcare Providers: GravelBags.it  This test is not yet approved or cleared by the [place] FDA and  has been authorized for detection and/or diagnosis of SARS-CoV-2 by  FDA under an Emergency Use Authorization (EUA). This EUA will remain  in effect (meaning this test can be used) for the duration of the  Covid-19 declaration under Section 564(b)(1) of the Act, 21  U.S.C. section 360bbb-3(b)(1), unless the authorization is  terminated or revoked. Performed at [facility], [place] [address], [place], [place] [address]   Culture, blood (routine x 2)     Status: None (Preliminary result)   Collection Time: 01/02/20  9:25 AM   Specimen: BLOOD  Result Value Ref Range Status   Specimen Description BLOOD SITE NOT SPECIFIED  Final   Special Requests   Final    BOTTLES DRAWN AEROBIC AND ANAEROBIC Blood Culture results may not be optimal due to an inadequate volume of blood received in culture bottles   Culture   Final    NO GROWTH < 24 HOURS Performed at [facility],  [place] [address] [place], [place] [address]    Report Status PENDING  Incomplete  Culture, blood (routine x 2)     Status: None (Preliminary result)   Collection Time: 01/02/20  9:39 AM   Specimen: BLOOD RIGHT HAND  Result Value Ref Range Status   Specimen Description BLOOD RIGHT HAND  Final   Special Requests   Final    BOTTLES DRAWN AEROBIC AND ANAEROBIC Blood Culture adequate volume   Culture   Final    NO GROWTH < 24 HOURS Performed at [facility], [place] [address] [place], [place] [address]    Report Status PENDING  Incomplete         Radiology Studies: US Abdomen Limited RUQ (LIVER/GB)  Result Date: 01/02/2020 CLINICAL DATA:  Initial evaluation for acute abdominal pain, elevated bilirubin, lipase, and white blood cell count. EXAM:  ULTRASOUND ABDOMEN LIMITED RIGHT UPPER QUADRANT COMPARISON:  Prior MRI from 12/06/2019 FINDINGS: Gallbladder: Scattered echogenic material seen within the gallbladder lumen, consistent with sludge. Few scattered small stones versus tumefactive sludge measuring up to 8 mm noted. Gallbladder wall appears thickened up to approximately 8 mm. No free pericholecystic fluid. A positive sonographic Murphy sign was elicited on exam. Common bile duct: Diameter: 5.6 mm Liver: No focal lesion identified. Within normal limits in parenchymal echogenicity. Portal vein is patent on color Doppler imaging with normal direction of blood flow towards the liver. Other: Small volume free fluid present within the right upper quadrant. IMPRESSION: 1. Gallbladder sludge and possible stones with associated gallbladder wall thickening and positive sonographic Murphy sign. Clinical correlation for possible acute cholecystitis recommended. 2. No biliary dilatation.  No visible choledocholithiasis. 3. Small volume free fluid within the right upper quadrant. Electronically Signed   By: Jeannine Boga M.D.   On: 01/02/2020 01:48        Scheduled Meds: . amiodarone  200 mg Oral  Daily  . insulin aspart  0-9 Units Subcutaneous Q4H  . metoprolol succinate  50 mg Oral Daily  . sodium chloride flush  3 mL Intravenous Q12H   Continuous Infusions: . famotidine (PEPCID) IV Stopped (01/02/20 2131)  . heparin 1,100 Units/hr (01/03/20 0508)  . lactated ringers 100 mL/hr at 01/03/20 0100  . piperacillin-tazobactam (ZOSYN)  IV 3.375 g (01/03/20 0509)     LOS: 1 day    Time spent:35 mins, More than 50% of that time was spent in counseling and/or coordination of care.      Shelly Coss, MD [facility] P11/10/2019, 8:52 AM

## 2020-01-03 NOTE — Progress Notes (Signed)
2 Days Post-Op  Subjective: Patient still with pain in his epigastrium.  Denies N/V.  Had only a few sips of liquids yesterday.  Frustrated that he isn't having surgery today as he states he was told by several people he would be.  We thoroughly discussed the physiology of pancreatitis and how that can affect our operation and the complications that can result from this.  He is still upset, but says he understands as does his wife.  Frustrated with this taking over 3 weeks to get to this point.  ROS: See above, otherwise other systems negative  Objective: Vital signs in last 24 hours: Temp:  [98 F (36.7 C)-98.7 F (37.1 C)] 98.7 F (37.1 C) (11/09 0835) Pulse Rate:  [58-72] 72 (11/09 0835) Resp:  [11-20] 18 (11/09 0835) BP: (134-174)/(67-93) 134/79 (11/09 0835) SpO2:  [92 %-97 %] 94 % (11/09 0835)    Intake/Output from previous day: 11/08 0701 - 11/09 0700 In: 1254.5 [I.V.:1043.9; IV Piggyback:210.7] Out: 400 [Urine:400] Intake/Output this shift: No intake/output data recorded.  PE: Heart: regular Lungs: CTAB Abd: soft, very point tender still in epigastrium, but improved from yesterday, hypoactive BS, ND  Lab Results:  Recent Labs    01/02/20 0500 01/03/20 0226  WBC 13.3* 11.1*  HGB 11.4* 10.7*  HCT 34.8* 32.5*  PLT 161 130*   BMET Recent Labs    01/02/20 0500 01/03/20 0226  NA 134* 135  K 4.5 3.7  CL 102 102  CO2 20* 23  GLUCOSE 232* 124*  BUN 13 11  CREATININE 0.88 0.81  CALCIUM 8.4* 7.9*   PT/INR No results for input(s): LABPROT, INR in the last 72 hours. CMP     Component Value Date/Time   NA 135 01/03/2020 0226   K 3.7 01/03/2020 0226   CL 102 01/03/2020 0226   CO2 23 01/03/2020 0226   GLUCOSE 124 (H) 01/03/2020 0226   BUN 11 01/03/2020 0226   CREATININE 0.81 01/03/2020 0226   CALCIUM 7.9 (L) 01/03/2020 0226   PROT 6.0 (L) 01/03/2020 0226   ALBUMIN 3.0 (L) 01/03/2020 0226   AST 76 (H) 01/03/2020 0226   ALT 102 (H) 01/03/2020 0226    ALKPHOS 79 01/03/2020 0226   BILITOT 1.6 (H) 01/03/2020 0226   GFRNONAA >60 01/03/2020 0226   GFRAA >60 06/19/2017 1633   Lipase     Component Value Date/Time   LIPASE 663 (H) 01/03/2020 0226       Studies/Results: US Abdomen Limited RUQ (LIVER/GB)  Result Date: 01/02/2020 CLINICAL DATA:  Initial evaluation for acute abdominal pain, elevated bilirubin, lipase, and white blood cell count. EXAM: ULTRASOUND ABDOMEN LIMITED RIGHT UPPER QUADRANT COMPARISON:  Prior MRI from 12/06/2019 FINDINGS: Gallbladder: Scattered echogenic material seen within the gallbladder lumen, consistent with sludge. Few scattered small stones versus tumefactive sludge measuring up to 8 mm noted. Gallbladder wall appears thickened up to approximately 8 mm. No free pericholecystic fluid. A positive sonographic Murphy sign was elicited on exam. Common bile duct: Diameter: 5.6 mm Liver: No focal lesion identified. Within normal limits in parenchymal echogenicity. Portal vein is patent on color Doppler imaging with normal direction of blood flow towards the liver. Other: Small volume free fluid present within the right upper quadrant. IMPRESSION: 1. Gallbladder sludge and possible stones with associated gallbladder wall thickening and positive sonographic Murphy sign. Clinical correlation for possible acute cholecystitis recommended. 2. No biliary dilatation.  No visible choledocholithiasis. 3. Small volume free fluid within the right upper quadrant. Electronically Signed  By: Jeannine Boga M.D.   On: 01/02/2020 01:48    Anti-infectives: Anti-infectives (From admission, onward)   Start     Dose/Rate Route Frequency Ordered Stop   01/02/20 0345  piperacillin-tazobactam (ZOSYN) IVPB 3.375 g        3.375 g 12.5 mL/hr over 240 Minutes Intravenous Every 8 hours 01/02/20 0343         Assessment/Plan HTN HLD DM A fib - eliquis on hold, on heparin gtt  Biliary pancreatitis -pancreatitis continues to improve, but  has not resolved enough yet to proceed with lap chole.  Anticipate if he continues to improve the way he is that hopefully we can proceed tomorrow.  However, we discussed this is a day by day decision based off of his exam.   -cont NPO x ice and few sips today -NPO p MN for possible OR tomorrow pending clinical improvement -will examine him tomorrow and hold heparin at 0600am in case we can proceed to OR. -cleared by cardiology  FEN - NPO x ice and sips today VTE - heparin gtt, eliquis on hold ID - [email or web address]   LOS: 1 day    Henreitta Cea , [facility] 01/03/2020, 9:35 AM Please see Amion for pager number during day hours 7:00am-4:30pm or 7:00am -11:30am on weekends

## 2020-01-03 NOTE — Progress Notes (Signed)
[facility] for Heparin (Apixaban on hold) Indication: atrial fibrillation  No Known Allergies   Vital Signs: Temp: 98.7 F (37.1 C) (11/09 0835) Temp Source: Oral (11/09 0835) BP: 134/79 (11/09 0835) Pulse Rate: 72 (11/09 0835)  Labs: Recent Labs    01/01/20 2347 01/01/20 2347 01/02/20 0207 01/02/20 0500 01/02/20 1344 01/02/20 1345 01/03/20 0226  HGB 11.5*   < >  --  11.4*  --   --  10.7*  HCT 34.8*  --   --  34.8*  --   --  32.5*  PLT 157  --   --  161  --   --  130*  APTT  --   --   --   --   --  69*  --   HEPARINUNFRC  --   --   --   --  0.56  --  0.36  CREATININE 0.87  --   --  0.88  --   --  0.81  TROPONINIHS 5  --  4  --   --   --   --    < > = values in this interval not displayed.    Estimated Creatinine Clearance: 78.5 mL/min (by C-G formula based on SCr of 0.81 mg/dL).  Assessment: 79 y/o M transfer from outside hospital with intra-abdominal infection. He is on apixaban PTA for afib. Holding apixaban and starting heparin as pt will likely need surgery or procedure this admission. Pharmacy consulted to dose heparin. Monitoring by heparin levels alone now.   Heparin level is therapeutic at 0.36 on 1100 units/hr. No bleeding noted, Hgb down to 10.7, platelets down to 130 - will follow trend.   Goal of Therapy:  Heparin level 0.3-0.7 units/ml Monitor platelets by anticoagulation protocol: Yes   Plan:  -Continue heparin drip at 1100 units/hr -Daily CBC, heparin level -Monitor for bleeding -F/U resuming apixaban post-op  Thank you for involving pharmacy in this patient's care.  Renold Genta, PharmD, BCPS Clinical Pharmacist Clinical phone for 01/03/2020 until 3p is [phone] 01/03/2020 8:44 AM  **Pharmacist phone directory can be found on [email or web address] listed under [place]**

## 2020-01-04 ENCOUNTER — Inpatient Hospital Stay (HOSPITAL_COMMUNITY): Payer: Medicare HMO

## 2020-01-04 ENCOUNTER — Inpatient Hospital Stay (HOSPITAL_COMMUNITY): Payer: Medicare HMO | Admitting: Certified Registered"

## 2020-01-04 ENCOUNTER — Encounter (HOSPITAL_COMMUNITY): Payer: Self-pay | Admitting: Family Medicine

## 2020-01-04 ENCOUNTER — Encounter (HOSPITAL_COMMUNITY)
Admission: AD | Disposition: A | Discharge status: Home / Self Care | Payer: Self-pay | Source: Other Acute Inpatient Hospital | Attending: Internal Medicine

## 2020-01-04 DIAGNOSIS — K810 Acute cholecystitis: Secondary | ICD-10-CM | POA: Diagnosis not present

## 2020-01-04 HISTORY — PX: CHOLECYSTECTOMY: SHX55

## 2020-01-04 LAB — GLUCOSE, CAPILLARY
Glucose-Capillary: 111 mg/dL — ABNORMAL HIGH (ref 70–99)
Glucose-Capillary: 111 mg/dL — ABNORMAL HIGH (ref 70–99)
Glucose-Capillary: 116 mg/dL — ABNORMAL HIGH (ref 70–99)
Glucose-Capillary: 132 mg/dL — ABNORMAL HIGH (ref 70–99)
Glucose-Capillary: 134 mg/dL — ABNORMAL HIGH (ref 70–99)
Glucose-Capillary: 148 mg/dL — ABNORMAL HIGH (ref 70–99)
Glucose-Capillary: 169 mg/dL — ABNORMAL HIGH (ref 70–99)
Glucose-Capillary: 188 mg/dL — ABNORMAL HIGH (ref 70–99)
Glucose-Capillary: 221 mg/dL — ABNORMAL HIGH (ref 70–99)

## 2020-01-04 LAB — COMPREHENSIVE METABOLIC PANEL
ALT: 63 U/L — ABNORMAL HIGH (ref 0–44)
AST: 33 U/L (ref 15–41)
Albumin: 2.7 g/dL — ABNORMAL LOW (ref 3.5–5.0)
Alkaline Phosphatase: 61 U/L (ref 38–126)
Anion gap: 9 (ref 5–15)
BUN: 9 mg/dL (ref 8–23)
CO2: 21 mmol/L — ABNORMAL LOW (ref 22–32)
Calcium: 7.7 mg/dL — ABNORMAL LOW (ref 8.9–10.3)
Chloride: 103 mmol/L (ref 98–111)
Creatinine, Ser: 0.66 mg/dL (ref 0.61–1.24)
GFR, Estimated: >60 mL/min (ref >60)
Glucose, Bld: 118 mg/dL — ABNORMAL HIGH (ref 70–99)
Potassium: 3.5 mmol/L (ref 3.5–5.1)
Sodium: 133 mmol/L — ABNORMAL LOW (ref 135–145)
Total Bilirubin: 1.4 mg/dL — ABNORMAL HIGH (ref 0.3–1.2)
Total Protein: 5.6 g/dL — ABNORMAL LOW (ref 6.5–8.1)

## 2020-01-04 LAB — CBC
HCT: 28.7 % — ABNORMAL LOW (ref 39.0–52.0)
HCT: 24.9 % — ABNORMAL LOW (ref 39.0–52.0)
HCT: 27.5 % — ABNORMAL LOW (ref 39.0–52.0)
Hemoglobin: 9.6 g/dL — ABNORMAL LOW (ref 13.0–17.0)
Hemoglobin: 8 g/dL — ABNORMAL LOW (ref 13.0–17.0)
Hemoglobin: 8.7 g/dL — ABNORMAL LOW (ref 13.0–17.0)
MCH: 32.8 pg (ref 26.0–34.0)
MCH: 32.4 pg (ref 26.0–34.0)
MCH: 32.1 pg (ref 26.0–34.0)
MCHC: 33.4 g/dL (ref 30.0–36.0)
MCHC: 32.1 g/dL (ref 30.0–36.0)
MCHC: 31.6 g/dL (ref 30.0–36.0)
MCV: 98 fL (ref 80.0–100.0)
MCV: 100.8 fL — ABNORMAL HIGH (ref 80.0–100.0)
MCV: 101.5 fL — ABNORMAL HIGH (ref 80.0–100.0)
Platelets: 114 10*3/uL — ABNORMAL LOW (ref 150–400)
Platelets: 166 K/uL (ref 150–400)
Platelets: 137 10*3/uL — ABNORMAL LOW (ref 150–400)
RBC: 2.93 MIL/uL — ABNORMAL LOW (ref 4.22–5.81)
RBC: 2.47 MIL/uL — ABNORMAL LOW (ref 4.22–5.81)
RBC: 2.71 MIL/uL — ABNORMAL LOW (ref 4.22–5.81)
RDW: 20.1 % — ABNORMAL HIGH (ref 11.5–15.5)
RDW: 20.8 % — ABNORMAL HIGH (ref 11.5–15.5)
RDW: 19.4 % — ABNORMAL HIGH (ref 11.5–15.5)
WBC: 10.1 10*3/uL (ref 4.0–10.5)
WBC: 19.6 K/uL — ABNORMAL HIGH (ref 4.0–10.5)
WBC: 16.7 10*3/uL — ABNORMAL HIGH (ref 4.0–10.5)
nRBC: 0 % (ref 0.0–0.2)
nRBC: 0.3 % — ABNORMAL HIGH (ref 0.0–0.2)
nRBC: 0.5 % — ABNORMAL HIGH (ref 0.0–0.2)

## 2020-01-04 LAB — LACTIC ACID, PLASMA
Lactic Acid, Venous: 6.7 mmol/L (ref 0.5–1.9)
Lactic Acid, Venous: 3.9 mmol/L (ref 0.5–1.9)

## 2020-01-04 LAB — LIPASE, BLOOD: Lipase: 119 U/L — ABNORMAL HIGH (ref 11–51)

## 2020-01-04 LAB — PREPARE RBC (CROSSMATCH)

## 2020-01-04 LAB — SURGICAL PCR SCREEN
MRSA, PCR: NEGATIVE
Staphylococcus aureus: POSITIVE — AB

## 2020-01-04 LAB — HEPARIN LEVEL (UNFRACTIONATED): Heparin Unfractionated: 0.12 IU/mL — ABNORMAL LOW (ref 0.30–0.70)

## 2020-01-04 SURGERY — LAPAROSCOPIC CHOLECYSTECTOMY WITH INTRAOPERATIVE CHOLANGIOGRAM
Anesthesia: General | Site: Abdomen

## 2020-01-04 MED ORDER — LACTATED RINGERS IV SOLN: INTRAVENOUS | Status: DC

## 2020-01-04 MED ORDER — CHLORHEXIDINE GLUCONATE 0.12 % MT SOLN
15.0000 mL | OROMUCOSAL | Status: AC
  Filled 2020-01-04: qty 15

## 2020-01-04 MED ORDER — METHOCARBAMOL 500 MG PO TABS
500.0000 mg | ORAL_TABLET | Freq: Four times a day (QID) | ORAL | Status: DC | PRN
  Administered 2020-01-07: 500 mg via ORAL
  Filled 2020-01-04: qty 1

## 2020-01-04 MED ORDER — SUFENTANIL CITRATE 50 MCG/ML IV SOLN
INTRAVENOUS | Status: AC
  Filled 2020-01-04: qty 1

## 2020-01-04 MED ORDER — SODIUM CHLORIDE 0.9 % IV SOLN: INTRAVENOUS | Status: DC

## 2020-01-04 MED ORDER — SODIUM CHLORIDE 0.9 % IV BOLUS
500.0000 mL | Freq: Once | INTRAVENOUS | Status: AC
  Administered 2020-01-04: 500 mL via INTRAVENOUS

## 2020-01-04 MED ORDER — BUPIVACAINE-EPINEPHRINE 0.5% -1:200000 IJ SOLN
INTRAMUSCULAR | Status: DC | PRN
  Administered 2020-01-04: 20 mL

## 2020-01-04 MED ORDER — MIDAZOLAM HCL 2 MG/2ML IJ SOLN
INTRAMUSCULAR | Status: AC
  Filled 2020-01-04: qty 2

## 2020-01-04 MED ORDER — SODIUM CHLORIDE 0.9 % IR SOLN
Status: DC | PRN
  Administered 2020-01-04: 1000 mL

## 2020-01-04 MED ORDER — ROCURONIUM 10MG/ML (10ML) SYRINGE FOR MEDFUSION PUMP - OPTIME
INTRAVENOUS | Status: DC | PRN
  Administered 2020-01-04: 60 mg via INTRAVENOUS

## 2020-01-04 MED ORDER — LACTATED RINGERS IV SOLN: INTRAVENOUS | Status: DC | PRN

## 2020-01-04 MED ORDER — DEXAMETHASONE SODIUM PHOSPHATE 10 MG/ML IJ SOLN
INTRAMUSCULAR | Status: DC | PRN
  Administered 2020-01-04: 10 mg via INTRAVENOUS

## 2020-01-04 MED ORDER — PROPOFOL 10 MG/ML IV BOLUS
INTRAVENOUS | Status: DC | PRN
  Administered 2020-01-04: 110 mg via INTRAVENOUS

## 2020-01-04 MED ORDER — BUPIVACAINE-EPINEPHRINE 0.5% -1:200000 IJ SOLN
INTRAMUSCULAR | Status: AC
  Filled 2020-01-04: qty 1

## 2020-01-04 MED ORDER — SUFENTANIL CITRATE 50 MCG/ML IV SOLN
INTRAVENOUS | Status: DC | PRN
  Administered 2020-01-04: 5 ug via INTRAVENOUS
  Administered 2020-01-04: 20 ug via INTRAVENOUS
  Administered 2020-01-04: 5 ug via INTRAVENOUS

## 2020-01-04 MED ORDER — SUGAMMADEX SODIUM 200 MG/2ML IV SOLN
INTRAVENOUS | Status: DC | PRN
  Administered 2020-01-04: 200 mg via INTRAVENOUS

## 2020-01-04 MED ORDER — OXYCODONE HCL 5 MG/5ML PO SOLN: 5.0000 mg | Freq: Once | ORAL | Status: DC | PRN

## 2020-01-04 MED ORDER — SODIUM CHLORIDE 0.9% IV SOLUTION: Freq: Once | INTRAVENOUS | Status: AC

## 2020-01-04 MED ORDER — PROPOFOL 10 MG/ML IV BOLUS
INTRAVENOUS | Status: AC
  Filled 2020-01-04: qty 20

## 2020-01-04 MED ORDER — HYDROMORPHONE HCL 1 MG/ML IJ SOLN
0.5000 mg | INTRAMUSCULAR | Status: DC | PRN
  Administered 2020-01-04 – 2020-01-05 (×4): 0.5 mg via INTRAVENOUS
  Filled 2020-01-04 (×4): qty 0.5

## 2020-01-04 MED ORDER — ACETAMINOPHEN 500 MG PO TABS: 1000.0000 mg | ORAL_TABLET | Freq: Once | ORAL | Status: DC | PRN

## 2020-01-04 MED ORDER — 0.9 % SODIUM CHLORIDE (POUR BTL) OPTIME
TOPICAL | Status: DC | PRN
  Administered 2020-01-04: 1000 mL

## 2020-01-04 MED ORDER — ACETAMINOPHEN 10 MG/ML IV SOLN: 1000.0000 mg | Freq: Once | INTRAVENOUS | Status: DC | PRN

## 2020-01-04 MED ORDER — DIPHENHYDRAMINE HCL 50 MG/ML IJ SOLN: 12.5000 mg | Freq: Every evening | INTRAMUSCULAR | Status: DC | PRN

## 2020-01-04 MED ORDER — MUPIROCIN 2 % EX OINT
TOPICAL_OINTMENT | CUTANEOUS | Status: AC
  Filled 2020-01-04: qty 22

## 2020-01-04 MED ORDER — FENTANYL CITRATE (PF) 100 MCG/2ML IJ SOLN: 25.0000 ug | INTRAMUSCULAR | Status: DC | PRN

## 2020-01-04 MED ORDER — SODIUM CHLORIDE 0.9 % IV BOLUS: 500.0000 mL | Freq: Once | INTRAVENOUS | Status: AC

## 2020-01-04 MED ORDER — OXYCODONE HCL 5 MG PO TABS
5.0000 mg | ORAL_TABLET | ORAL | Status: DC | PRN
  Administered 2020-01-06: 10 mg via ORAL
  Administered 2020-01-07 (×3): 5 mg via ORAL
  Filled 2020-01-04 (×2): qty 1
  Filled 2020-01-04 (×2): qty 2

## 2020-01-04 MED ORDER — ACETAMINOPHEN 160 MG/5ML PO SOLN: 1000.0000 mg | Freq: Once | ORAL | Status: DC | PRN

## 2020-01-04 MED ORDER — OXYCODONE HCL 5 MG PO TABS: 5.0000 mg | ORAL_TABLET | Freq: Once | ORAL | Status: DC | PRN

## 2020-01-04 MED ORDER — LIDOCAINE HCL (CARDIAC) PF 100 MG/5ML IV SOSY
PREFILLED_SYRINGE | INTRAVENOUS | Status: DC | PRN
  Administered 2020-01-04: 60 mg via INTRAVENOUS

## 2020-01-04 MED ORDER — CHLORHEXIDINE GLUCONATE 0.12 % MT SOLN
OROMUCOSAL | Status: AC
  Administered 2020-01-04: 15 mL via OROMUCOSAL
  Filled 2020-01-04: qty 15

## 2020-01-04 MED ORDER — SODIUM CHLORIDE 0.9% IV SOLUTION: Freq: Once | INTRAVENOUS | Status: DC

## 2020-01-04 MED ORDER — ONDANSETRON HCL 4 MG/2ML IJ SOLN
INTRAMUSCULAR | Status: DC | PRN
  Administered 2020-01-04: 4 mg via INTRAVENOUS

## 2020-01-04 SURGICAL SUPPLY — 47 items
ADH SKN CLS APL DERMABOND .7 (GAUZE/BANDAGES/DRESSINGS) ×1
APL PRP STRL LF DISP 70% ISPRP (MISCELLANEOUS) ×1
APPLIER CLIP 5 13 M/L LIGAMAX5 (MISCELLANEOUS) ×3
APR CLP MED LRG 5 ANG JAW (MISCELLANEOUS) ×1
BAG SPEC RTRVL LRG 6X4 10 (ENDOMECHANICALS) ×1
BLADE CLIPPER SURG (BLADE) IMPLANT
CANISTER SUCT 3000ML PPV (MISCELLANEOUS) ×3 IMPLANT
CHLORAPREP W/TINT 26 (MISCELLANEOUS) ×3 IMPLANT
CLIP APPLIE 5 13 M/L LIGAMAX5 (MISCELLANEOUS) ×1 IMPLANT
COVER MAYO STAND STRL (DRAPES) IMPLANT
COVER SURGICAL LIGHT HANDLE (MISCELLANEOUS) ×3 IMPLANT
COVER WAND RF STERILE (DRAPES) ×3 IMPLANT
DERMABOND ADVANCED (GAUZE/BANDAGES/DRESSINGS) ×2
DERMABOND ADVANCED .7 DNX12 (GAUZE/BANDAGES/DRESSINGS) ×1 IMPLANT
DRAIN CHANNEL 19F RND (DRAIN) ×2 IMPLANT
DRAPE C-ARM 42X120 X-RAY (DRAPES) IMPLANT
DRSG TEGADERM 2-3/8X2-3/4 SM (GAUZE/BANDAGES/DRESSINGS) ×4 IMPLANT
ELECT REM PT RETURN 9FT ADLT (ELECTROSURGICAL) ×3
ELECTRODE REM PT RTRN 9FT ADLT (ELECTROSURGICAL) ×1 IMPLANT
EVACUATOR SILICONE 100CC (DRAIN) ×2 IMPLANT
GAUZE SPONGE 2X2 8PLY STRL LF (GAUZE/BANDAGES/DRESSINGS) IMPLANT
GLOVE SURG SIGNA 7.5 PF LTX (GLOVE) ×3 IMPLANT
GOWN STRL REUS W/ TWL LRG LVL3 (GOWN DISPOSABLE) ×2 IMPLANT
GOWN STRL REUS W/ TWL XL LVL3 (GOWN DISPOSABLE) ×1 IMPLANT
GOWN STRL REUS W/TWL LRG LVL3 (GOWN DISPOSABLE) ×6
GOWN STRL REUS W/TWL XL LVL3 (GOWN DISPOSABLE) ×3
KIT BASIN OR (CUSTOM PROCEDURE TRAY) ×3 IMPLANT
KIT TURNOVER KIT B (KITS) ×3 IMPLANT
NS IRRIG 1000ML POUR BTL (IV SOLUTION) ×3 IMPLANT
PAD ARMBOARD 7.5X6 YLW CONV (MISCELLANEOUS) ×3 IMPLANT
POUCH SPECIMEN RETRIEVAL 10MM (ENDOMECHANICALS) ×3 IMPLANT
SCISSORS LAP 5X35 DISP (ENDOMECHANICALS) ×3 IMPLANT
SET CHOLANGIOGRAPH 5 50 .035 (SET/KITS/TRAYS/PACK) IMPLANT
SET IRRIG TUBING LAPAROSCOPIC (IRRIGATION / IRRIGATOR) ×3 IMPLANT
SET TUBE SMOKE EVAC HIGH FLOW (TUBING) ×3 IMPLANT
SLEEVE ENDOPATH XCEL 5M (ENDOMECHANICALS) ×6 IMPLANT
SPECIMEN JAR SMALL (MISCELLANEOUS) ×3 IMPLANT
SPONGE GAUZE 2X2 8PLY STER LF (GAUZE/BANDAGES/DRESSINGS) ×1
SPONGE GAUZE 2X2 8PLY STRL LF (GAUZE/BANDAGES/DRESSINGS) ×1 IMPLANT
SPONGE GAUZE 2X2 STER 10/PKG (GAUZE/BANDAGES/DRESSINGS) ×2
SUT MNCRL AB 4-0 PS2 18 (SUTURE) ×3 IMPLANT
TOWEL GREEN STERILE (TOWEL DISPOSABLE) ×3 IMPLANT
TOWEL GREEN STERILE FF (TOWEL DISPOSABLE) ×3 IMPLANT
TRAY LAPAROSCOPIC MC (CUSTOM PROCEDURE TRAY) ×3 IMPLANT
TROCAR XCEL BLUNT TIP 100MML (ENDOMECHANICALS) ×3 IMPLANT
TROCAR XCEL NON-BLD 5MMX100MML (ENDOMECHANICALS) ×3 IMPLANT
WATER STERILE IRR 1000ML POUR (IV SOLUTION) ×3 IMPLANT

## 2020-01-04 NOTE — Progress Notes (Signed)
Pt remained in the floor, per Dr. Marlowe Sax blood pressure improved, theres no need for the patient to be transferred to ICU at this time. Will continue to monitor.

## 2020-01-04 NOTE — Significant Event (Addendum)
Rapid Response Event Note   Reason for Call :  Hypotension s/p laparoscopic cholecystectomy  Initial Focused Assessment:  Responds to voice, oriented. PERRLA, follows commands. Skin color is pale. Skin is cool, moist. Peripheral pulses are 2+. Lung sounds are clear. Abdomen is distended, soft- Dr. Ninfa Linden informs me this is not new. Serosangious fluid is leaking from around JP drain insertion site. JP drain is having moderate serosangious output. Pt endorses pain "all over". 8/10 pain. He is restless. He intermittently dry heaves, small amount of sputum produced.   VS: T 97.67F, BP 78/54, HR 58, RR 14, SpO2 95% on room air  Addendum 1840: Rounded back on pt following blood transfusion. BP 70/52, HR 59. Pt states he feels better than earlier, denies lightheadedness. Still endorses 8/10 abdominal pain- unchanged. Skin is warm, dry. Pt remains pale. 500cc IVF bolus started with BP improvement up to 95/56, HR 60. Dr. Broadus John notified. Pattern noticed of when blood transfusion or fluid is stopped, pt BP declines. PCCM consulted by Dr. Broadus John. Dr. Zenia Resides with surgery updated. Additional orders received to transfuse 2nd unit of PRBCs. STAT CBC.   Interventions:  -4mg  IV Zofran  -Total of 2.5L IVF bolus, followed by 164mL/hr maintenance fluids -EKG -2nd PIV established -CBC- Hgb 8 / 8.7 -2U PRBC  Plan of Care:  -Maintain 2 working peripheral IVs -Trend VS  Call rapid response for additional needs. Event Summary:  MD Notified: Dr. Ninfa Linden, Dr. Broadus John Call Time: 1448 Arrival Time: 1430 End Time: [place], RN

## 2020-01-04 NOTE — Progress Notes (Signed)
Patient admitted for acute biliary pancreatitis and underwent laparoscopic cholecystectomy today.  He was hypotensive after the surgery with systolic in the 19F after returning from the PACU.  EKG was done and not just of ACS.  Hemoglobin preop was 9.6 and repeat CBC done postop showing hemoglobin 8.0.  Blood pressure improved initially after he was given fluid boluses, maintenance fluid, and 1 unit PRBCs.    This evening patient became hypotensive again with systolic in the 79K.  Additional 500 cc fluid bolus +1 unit PRBCs were ordered.  I was called to evaluate the patient.  He was somnolent but easily arousable and answering questions.  His only complaint was 8 out of 10 intensity generalized abdominal pain.  Denied chest pain or shortness of breath.  Patient's abdomen appeared distended and very tender even to minimal palpation.  Serosanguineous fluid noted in JP.  Per nursing staff, 160 cc output in the past 3 hours.  At the time of my evaluation, heart rate in the 60s and blood pressure improved to 110/64.  Patient subsequently had a large bowel movement -nonbloody well-formed brown stool.  Repeat CBC done and showing WBC 16.7, improved since labs done this afternoon.  Hemoglobin stable at 8.7.  -General surgery has been consulted and will see the patient -Discussed the case with on-call PCCM MD who did not feel that the patient needed to be transferred to the ICU at this time as his blood pressure had improved.  Recommended checking lactate and ordering KUB. -Will continue to monitor very closely, low threshold to call PCCM if he becomes hypotensive again.

## 2020-01-04 NOTE — Anesthesia Preprocedure Evaluation (Addendum)
Anesthesia Evaluation  Patient identified by MRN, date of birth, ID band Patient awake    Reviewed: Allergy & Precautions, NPO status , Patient's Chart, lab work & pertinent test results  History of Anesthesia Complications Negative for: history of anesthetic complications  Airway Mallampati: II  TM Distance: >3 FB Neck ROM: Full    Dental  (+) Dental Advisory Given   Pulmonary neg pulmonary ROS,  .Covid-19 Nucleic Acid Test Results Lab Results      Component                Value               Date                      SARSCOV2NAA              NEGATIVE            01/02/2020                [place]              NEGATIVE            12/05/2019              breath sounds clear to auscultation       Cardiovascular hypertension, Pt. on medications and Pt. on home beta blockers (-) angina(-) Past MI and (-) CHF  Rhythm:Regular     Neuro/Psych negative neurological ROS  negative psych ROS   GI/Hepatic Neg liver ROS, neg GERD  ,cholecystitis   Endo/Other  diabetes  Renal/GU Renal diseaseLab Results      Component                Value               Date                      CREATININE               0.66                01/04/2020                Musculoskeletal   Abdominal   Peds  Hematology  (+) Blood dyscrasia, anemia , Lab Results      Component                Value               Date                      WBC                      10.1                01/04/2020                HGB                      9.6 (L)             01/04/2020                HCT                      28.7 (L)  01/04/2020                MCV                      98.0                01/04/2020                PLT                      114 (L)             01/04/2020              Anesthesia Other Findings 1. MIld distal septal hypokinesis. . Left ventricular ejection fraction,  by estimation, is 55 to 60%. The left ventricle has normal  function. The  left ventricle demonstrates regional wall motion abnormalities (see  scoring diagram/findings for  description). There is mild left ventricular hypertrophy. Left ventricular  diastolic parameters were normal.  2. Right ventricular systolic function is mildly reduced. The right  ventricular size is normal. There is mildly elevated pulmonary artery  systolic pressure.  3. Left atrial size was mild to moderately dilated.  4. The mitral valve is abnormal. Moderate mitral valve regurgitation.  5. The aortic valve is abnormal. Aortic valve regurgitation is trivial.  Mild to moderate aortic valve sclerosis/calcification is present, without  any evidence of aortic stenosis.  6. The inferior vena cava is dilated in size with <50% respiratory  variability, suggesting right atrial pressure of 15 mmHg.   Reproductive/Obstetrics                             Anesthesia Physical Anesthesia Plan  ASA: II  Anesthesia Plan: General   Post-op Pain Management:    Induction: Intravenous  PONV Risk Score and Plan: 2 and Ondansetron and Dexamethasone  Airway Management Planned: Oral ETT  Additional Equipment: None  Intra-op Plan:   Post-operative Plan: Extubation in OR  Informed Consent: I have reviewed the patients History and Physical, chart, labs and discussed the procedure including the risks, benefits and alternatives for the proposed anesthesia with the patient or authorized representative who has indicated his/her understanding and acceptance.     Dental advisory given  Plan Discussed with: CRNA and Surgeon  Anesthesia Plan Comments:         Anesthesia Quick Evaluation

## 2020-01-04 NOTE — Op Note (Signed)
LAPAROSCOPIC CHOLECYSTECTOMY  Procedure Note  Andrew Humphrey 01/04/2020   Pre-op Diagnosis: cholecystitis, pancreatitis     Post-op Diagnosis: same  Procedure(s): LAPAROSCOPIC CHOLECYSTECTOMY  Surgeon(s): Coralie Keens, MD  Anesthesia: General  Staff:  Circulator: Carron Curie, RN Relief Circulator: Allene Dillon, RN Scrub Person: Rometta Emery, RN; Alyson Ingles D  Estimated Blood Loss: less than 100 mL               Specimens: sent to path  Indications: This is a gentleman who presented with cholecystitis and pancreatitis.  There was only a slight suggestion of sludge and stones in the gallbladder on ultrasound.  MRCP was unremarkable.  His pancreatitis improved so decision was made to proceed with cholecystectomy  Findings: The patient had an acutely inflamed gallbladder with purulence in the gallbladder.  It was extremely woody with a phlegmon in the area of the cystic duct.  Procedure: The patient was brought to operating identifies correct patient.  He was placed upon the operating table general anesthesia was induced.  His abdomen was then prepped and draped in the usual sterile fashion.  I made a small vertical incision below the umbilicus with a scalpel.  I carried this down to the fascia which was then opened the scalpel toward a small umbilical fascial defect.  I then passed a hemostat to the abdominal cavity under direct vision.  A 0 Vicryl pursestring suture was then placed around the fascial opening.  The [facility] port was placed to the opening and insufflation of the abdomen was begun.  I placed a 5 mm trocar in the patient's epigastrium and 2 more in the right upper quadrant under direct vision.  The patient had a large amount of omentum stuck over the top of the liver and on top of the gallbladder.  His colon was quite distended as well.  I was able to bluntly free up the omentum off of the liver and gallbladder.  The gallbladder was distended and acutely  inflamed.  As I grasped the gallbladder a hole opened up and purulence drained out.  I was then able to finally elevate the gallbladder slightly further.  There was a large phlegmon at the base of the gallbladder.  I slowly dissected on each side of the gallbladder working toward the area of the cystic duct.  I was finally able to achieve what appeared to be a critical window around the cystic duct and cystic artery.  This was slightly difficult to discern given the woodiness of the phlegmon.  I was unable to perform a cholangiogram as the tissue was tearing so easily.  I clipped what appeared to be the cystic duct 3 times proximally once distally and transected and then the cystic artery as well and transected it.  The gallbladder was then taken the rest the left liver bed with electrocautery.  I placed the gallbladder in an Endosac and removed it through the incision at the umbilicus.  I then copiously irrigated the right upper quadrant with normal saline.  At this point I decided placed a 62 [place] Blake drain in the right upper quadrant.  I placed a grasper through the most lateral port site and brought out the epigastric port and pulled a 19 French drain through this.  I placed a drain in the right upper quadrant and then coverage of chest wall with a nylon suture.  I irrigated the abdomen further with saline.  Hemostasis appeared to be achieved.  All ports were  removed under direct vision and the abdomen was deflated.  The 0 Vicryl at the umbilicus was tied in place closing the fascial defect.  All incisions were then anesthetized with Marcaine and closed with 4-0 Monocryl sutures.  Dermabond was then applied.  The patient tolerated the procedure well.  All the counts were correct at the end of the procedure.  The patient was then extubated in the operating room and taken in a stable condition to the recovery room.          Coralie Keens   Date: 01/04/2020  Time: 12:28 PM

## 2020-01-04 NOTE — Progress Notes (Signed)
Patient ID: Andrew Humphrey, male   DOB: 25-Oct-1940, 79 y.o.   MRN: [id number]  Called to bedside secondary to post op hypotension, concerns about fluid in JP bulb, potential active bleeding. Patient is 3 hours s/p lap chole and just got into the room.  Was normotensive in the PACU.   Found to have systolic BP of 78 and HR of 56. Fluid in JP is blood tinged. I emptied it and recharged the drain with only mild to moderate output since. Fluid bolus given and systolic pressure up to 175 with HR still 56. EKG shows sinus bradycardia  Stat cbc pending.

## 2020-01-04 NOTE — Anesthesia Procedure Notes (Signed)
Procedure Name: Intubation Date/Time: 01/04/2020 11:28 AM Performed by: Claris Che, CRNA Pre-anesthesia Checklist: Patient identified, Emergency Drugs available, Suction available, Patient being monitored and Timeout performed Patient Re-evaluated:Patient Re-evaluated prior to induction Oxygen Delivery Method: Circle system utilized Preoxygenation: Pre-oxygenation with 100% oxygen Induction Type: IV induction and Rapid sequence Ventilation: Mask ventilation without difficulty Laryngoscope Size: Mac Grade View: Grade I Tube type: Oral Tube size: 7.5 mm Number of attempts: 1 Airway Equipment and Method: Stylet Secured at: 24 cm Tube secured with: Tape Dental Injury: Teeth and Oropharynx as per pre-operative assessment

## 2020-01-04 NOTE — Progress Notes (Signed)
Degrace arrived back from OR with stable vitals, around 1430  Andrew Humphrey's JP drain dressing site was saturated with blood and vital signs were checked and recorded with the BP at 78/54, charge nurse, MD, and the surgeon were immediately notified at 1420. RN notified rapid response team, and continuously monitored vital signs. Andrew Humphrey was also showing signs of bradycardia at a HR of 56 last taken at 1457. A bolus of normal saline was administered at 946mL/hr. Andrew Humphrey's vitals signs were stabilized with the blood pressure returning to 108/61. MD did come to Andrew Humphrey bedside and changed JP dressing and gave verbal orders to administer normal saline. Andrew Humphrey transferred to [place], RN gave report to receiving nurse Emogene Morgan.

## 2020-01-04 NOTE — Progress Notes (Signed)
Patient had a bowel movement.  Cleaned and new linen. Rapid response nurse Debarah Crape at bedside.   Pt continues to be hypotensive.  Two 500 NS bolus given since pt arrival to unit. 1PRBC completed.  175ml output from jp bulb documented. Dressing was changed x3 since pt arival to unit.  Nurse has already call report to 2M07 receiving nurse.

## 2020-01-04 NOTE — Progress Notes (Signed)
Patient ID: KOY LAMP, male   DOB: 1940/12/15, 79 y.o.   MRN: [id number]   Given Hgb of 8.0, will transfuse 1 unit PRBCs

## 2020-01-04 NOTE — Progress Notes (Signed)
Patient c/o chest comfort. Patient described the discomfort as his chest 'being sore'. Dr. Fanny Bien paged. Awaiting orders

## 2020-01-04 NOTE — Progress Notes (Addendum)
PROGRESS NOTE    Andrew Humphrey  WPY:[id number] DOB: Jan 18, 1941 DOA: 01/01/2020 PCP: Lanelle Bal, PA-C   Chief Complain: Abdominal pain, nausea, vomiting  Brief Narrative: Patient is a 79 year old male with history of atrial fibrillation on Eliquis, diabetes mellitus type 2, hypertension, recent history of Klebsiella bacteremia secondary to cholecystitis who presented to the emergency department with complaints of upper abdominal pain, nausea, vomiting.  [facility], found to have cholecystitis and underwent conservative management and was discharged to home.  He was evaluated at outside hospital where EKG was concerning for STEMI and was sent to the emergency department for cardiology evaluation.  EKG features resolved at the time he presented to [facility].  Troponins were normal, code STEMI canceled. -He was found to have elevated LFTs bilirubin and lipase of 3987 -Right upper quadrant ultrasound noted gallbladder sludge, gallbladder wall thickening and positive Murphys sign   Assessment & Plan:   Acute biliary pancreatitis:  -Admitted with abdominal pain nausea and vomiting associated with elevated lipase LFTs and bilirubin  - Ultrasound showed acute cholecystitis with no biliary dilation. -General surgery following, for lap chole with IOC today -Also evaluated by cardiology -LFTs improving, continue to trend  Paroxysmal A. fib: Currently in normal sinus rhythm.CHADS-VASc at least 58 (age x2, DM)  on amiodarone and metoprolol.   -Eliquis held for surgery, on IV heparin   Diabetes type 2: Hemoglobin A1c was 9.9 in October 2021.  Continue to monitor blood sugars, continue sliding-scale insulin.  DVT prophylaxis:Heparin IV Code Status: Full Family Communication: Wife at bedside Status is: Inpatient  Remains inpatient appropriate because:Inpatient level of care appropriate due to severity of illness   Dispo: The patient is from:  Home              Anticipated d/c is to: Home              Anticipated d/c date is: 2 days              Patient currently is not medically stable to d/c.  Waiting for cholecystectomy  Consultants: None  Procedures:None  Antimicrobials:  Anti-infectives (From admission, onward)   Start     Dose/Rate Route Frequency Ordered Stop   01/02/20 0345  piperacillin-tazobactam (ZOSYN) IVPB 3.375 g        3.375 g 12.5 mL/hr over 240 Minutes Intravenous Every 8 hours 01/02/20 0343        Subjective:  -Feels okay, had epigastric abdominal pain earlier this morning, now resolved  Objective: Vitals:   01/04/20 1253 01/04/20 1307 01/04/20 1315 01/04/20 1339  BP: (!) 165/76 140/75  116/73  Pulse: 72 72 71 68  Resp: 20 10 12 18   Temp:    97.8 F (36.6 C)  TempSrc:    Oral  SpO2: 99% 96% 98% 95%  Weight:      Height:        Intake/Output Summary (Last 24 hours) at 01/04/2020 1356 Last data filed at 01/04/2020 1321 Gross per 24 hour  Intake 3192.69 ml  Output 1105 ml  Net 2087.69 ml   Filed Weights   01/02/20 0758 01/04/20 1030  Weight: 82.6 kg 82.6 kg    Examination:  Gen: Awake, Alert, Oriented X 3, no distress HEENT: PERRLA, Neck supple, no JVD Lungs: CTAB CVS: S1-S2, regular rate rhythm Abd: Soft, mildly distended, mild right upper quadrant tenderness, bowel sounds present Extremities: No edema Skin: no new rashes    Data  Reviewed: I have personally reviewed following labs and imaging studies  CBC: Recent Labs  Lab 01/01/20 2347 01/02/20 0500 01/03/20 0226 01/04/20 0131  WBC 16.8* 13.3* 11.1* 10.1  HGB 11.5* 11.4* 10.7* 9.6*  HCT 34.8* 34.8* 32.5* 28.7*  MCV 97.8 99.1 97.6 98.0  PLT 157 161 130* 062*   Basic Metabolic Panel: Recent Labs  Lab 01/01/20 2347 01/02/20 0500 01/03/20 0226 01/04/20 0131  NA 135 134* 135 133*  K 4.3 4.5 3.7 3.5  CL 102 102 102 103  CO2 21* 20* 23 21*  GLUCOSE 225* 232* 124* 118*  BUN 12 13 11 9   CREATININE 0.87 0.88  0.81 0.66  CALCIUM 8.6* 8.4* 7.9* 7.7*   GFR: Estimated Creatinine Clearance: 79.5 mL/min (by C-G formula based on SCr of 0.66 mg/dL). Liver Function Tests: Recent Labs  Lab 01/01/20 2347 01/02/20 0500 01/03/20 0226 01/04/20 0131  AST 220* 183* 76* 33  ALT 160* 159* 102* 63*  ALKPHOS 80 76 79 61  BILITOT 3.7* 4.0* 1.6* 1.4*  PROT 6.7 6.7 6.0* 5.6*  ALBUMIN 3.5 3.4* 3.0* 2.7*   Recent Labs  Lab 01/01/20 2347 01/02/20 0500 01/03/20 0226 01/04/20 0131  LIPASE 3,987* 1,719* 663* 119*   No results for input(s): AMMONIA in the last 168 hours. Coagulation Profile: No results for input(s): INR, PROTIME in the last 168 hours. Cardiac Enzymes: No results for input(s): CKTOTAL, CKMB, CKMBINDEX, TROPONINI in the last 168 hours. BNP (last 3 results) No results for input(s): PROBNP in the last 8760 hours. HbA1C: No results for input(s): HGBA1C in the last 72 hours. CBG: Recent Labs  Lab 01/04/20 0405 01/04/20 0734 01/04/20 1039 01/04/20 1239 01/04/20 1337  GLUCAP 116* 134* 132* 111* 148*   Lipid Profile: Recent Labs    01/01/20 2347  CHOL 150  HDL 61  LDLCALC NOT CALCULATED  TRIG 39  CHOLHDL 2.5   Thyroid Function Tests: No results for input(s): TSH, T4TOTAL, FREET4, T3FREE, THYROIDAB in the last 72 hours. Anemia Panel: No results for input(s): VITAMINB12, FOLATE, FERRITIN, TIBC, IRON, RETICCTPCT in the last 72 hours. Sepsis Labs: No results for input(s): PROCALCITON, LATICACIDVEN in the last 168 hours.  Recent Results (from the past 240 hour(s))  Respiratory Panel by RT PCR (Flu A&B, Covid) - Nasopharyngeal Swab     Status: None   Collection Time: 01/02/20  3:28 AM   Specimen: Nasopharyngeal Swab  Result Value Ref Range Status   SARS Coronavirus 2 by RT PCR NEGATIVE NEGATIVE Final    Comment: (NOTE) SARS-CoV-2 target nucleic acids are NOT DETECTED.  The SARS-CoV-2 RNA is generally detectable in upper respiratoy specimens during the acute phase of infection.  The lowest concentration of SARS-CoV-2 viral copies this assay can detect is 131 copies/mL. A negative result does not preclude SARS-Cov-2 infection and should not be used as the sole basis for treatment or other patient management decisions. A negative result may occur with  improper specimen collection/handling, submission of specimen other than nasopharyngeal swab, presence of viral mutation(s) within the areas targeted by this assay, and inadequate number of viral copies (<131 copies/mL). A negative result must be combined with clinical observations, patient history, and epidemiological information. The expected result is Negative.  Fact Sheet for Patients:  PinkCheek.be  Fact Sheet for Healthcare Providers:  GravelBags.it  This test is no t yet approved or cleared by the [place] FDA and  has been authorized for detection and/or diagnosis of SARS-CoV-2 by FDA under an Emergency Use Authorization (EUA). This  EUA will remain  in effect (meaning this test can be used) for the duration of the COVID-19 declaration under Section 564(b)(1) of the Act, 21 U.S.C. section 360bbb-3(b)(1), unless the authorization is terminated or revoked sooner.     Influenza A by PCR NEGATIVE NEGATIVE Final   Influenza B by PCR NEGATIVE NEGATIVE Final    Comment: (NOTE) The Xpert Xpress SARS-CoV-2/FLU/RSV assay is intended as an aid in  the diagnosis of influenza from Nasopharyngeal swab specimens and  should not be used as a sole basis for treatment. Nasal washings and  aspirates are unacceptable for Xpert Xpress SARS-CoV-2/FLU/RSV  testing.  Fact Sheet for Patients: PinkCheek.be  Fact Sheet for Healthcare Providers: GravelBags.it  This test is not yet approved or cleared by the [place] FDA and  has been authorized for detection and/or diagnosis of SARS-CoV-2 by  FDA  under an Emergency Use Authorization (EUA). This EUA will remain  in effect (meaning this test can be used) for the duration of the  Covid-19 declaration under Section 564(b)(1) of the Act, 21  U.S.C. section 360bbb-3(b)(1), unless the authorization is  terminated or revoked. Performed at [facility], [place] [address], [place], [place] [address]   Culture, blood (routine x 2)     Status: None (Preliminary result)   Collection Time: 01/02/20  9:25 AM   Specimen: BLOOD  Result Value Ref Range Status   Specimen Description BLOOD SITE NOT SPECIFIED  Final   Special Requests   Final    BOTTLES DRAWN AEROBIC AND ANAEROBIC Blood Culture results may not be optimal due to an inadequate volume of blood received in culture bottles   Culture   Final    NO GROWTH 2 DAYS Performed at [facility], [place] [address], [place], [place] [address]    Report Status PENDING  Incomplete  Culture, blood (routine x 2)     Status: None (Preliminary result)   Collection Time: 01/02/20  9:39 AM   Specimen: BLOOD RIGHT HAND  Result Value Ref Range Status   Specimen Description BLOOD RIGHT HAND  Final   Special Requests   Final    BOTTLES DRAWN AEROBIC AND ANAEROBIC Blood Culture adequate volume   Culture   Final    NO GROWTH 2 DAYS Performed at [facility], [place] [address] [place], [place] [address]    Report Status PENDING  Incomplete  Surgical pcr screen     Status: Abnormal   Collection Time: 01/04/20 10:03 AM   Specimen: Nasal Mucosa; Nasal Swab  Result Value Ref Range Status   MRSA, PCR NEGATIVE NEGATIVE Final   Staphylococcus aureus POSITIVE (A) NEGATIVE Final    Comment: (NOTE) The Xpert SA Assay (FDA approved for NASAL specimens in patients 61 years of age and older), is one component of a comprehensive surveillance program. It is not intended to diagnose infection nor to guide or monitor treatment. Performed at [facility], [place] [address] [place], [place] [address]       Scheduled Meds: . amiodarone  200 mg Oral Daily  . insulin aspart  0-9 Units Subcutaneous Q4H  . metoprolol succinate  50 mg Oral Daily  . mupirocin ointment      . sodium chloride flush  3 mL Intravenous Q12H   Continuous Infusions: . sodium chloride 100 mL/hr at 01/04/20 0913  . famotidine (PEPCID) IV 20 mg (01/04/20 0852)  . lactated ringers 10 mL/hr at 01/04/20 1028  . piperacillin-tazobactam (ZOSYN)  IV 3.375 g (01/04/20 2563)     LOS: 2 days    Time spent: 76min   Domenic Polite, MD [facility] P11/11/2019, 1:56 PM

## 2020-01-04 NOTE — Progress Notes (Signed)
General Surgery  I was called regarding the patient earlier this evening after he became hypotensive. He had a lap chole earlier today and was hypotensive a few hours postop, and was given 1u PRBCs (hgb 8.0 prior to transfusion, 8.7 immediately after). After the transfusion he again became hypotensive. He was given crystalloid and another unit of blood with subsequent improvement in blood pressure. At the time of my exam he was normotensive and the second unit of blood had just started transfusing. He was alert and mentating. On exam the abdomen was mildly tender, incisions clean and in tact, JP with thin serosanguinous drainage. I stripped the drain to ensure it was functioning. I do not think the patient is actively bleeding given appropriate rise in hemoglobin after blood transfusion. Intraop he had a very inflamed gallbladder with purulent drainage, so this may be a SIRS response. Agree with ongoing fluid resuscitation. Will continue to follow closely.  Michaelle Birks, [facility] 01/04/20 10:08 PM

## 2020-01-04 NOTE — Transfer of Care (Signed)
Immediate Anesthesia Transfer of Care Note  Patient: Andrew Humphrey  Procedure(s) Performed: LAPAROSCOPIC CHOLECYSTECTOMY (N/A Abdomen)  Patient Location: PACU  Anesthesia Type:General  Level of Consciousness: oriented, drowsy, patient cooperative and responds to stimulation  Airway & Oxygen Therapy: Patient Spontanous Breathing and Patient connected to nasal cannula oxygen  Post-op Assessment: Report given to RN, Post -op Vital signs reviewed and stable and Patient moving all extremities X 4  Post vital signs: Reviewed and stable  Last Vitals:  Vitals Value Taken Time  BP 186/89 01/04/20 1238  Temp 36.7 C 01/04/20 1238  Pulse 76 01/04/20 1241  Resp 20 01/04/20 1241  SpO2 100 % 01/04/20 1241  Vitals shown include unvalidated device data.  Last Pain:  Vitals:   01/04/20 1238  TempSrc:   PainSc: Asleep      Patients Stated Pain Goal: 0 (99/80/01 2393)  Complications: No complications documented.

## 2020-01-04 NOTE — Progress Notes (Signed)
   01/04/20 1632  Assess: MEWS Score  Temp (!) 97.5 F (36.4 C)  BP (!) 86/53  Pulse Rate 60  ECG Heart Rate 61  Resp 18  Level of Consciousness Responds to Voice  SpO2 99 %  O2 Device Room Air  Patient Activity (if Appropriate) In bed  Assess: MEWS Score  MEWS Temp 0  MEWS Systolic 1  MEWS Pulse 0  MEWS RR 0  MEWS LOC 1  MEWS Score 2  MEWS Score Color Yellow  Assess: if the MEWS score is Yellow or Red  Were vital signs taken at a resting state? Yes  Focused Assessment No change from prior assessment  Early Detection of Sepsis Score *See Row Information* Low  MEWS guidelines implemented *See Row Information* No, previously yellow, continue vital signs every 4 hours  Treat  MEWS Interventions Administered scheduled meds/treatments  Pain Scale 0-10  Pain Score Asleep  Neuro symptoms relieved by Rest  Take Vital Signs  Increase Vital Sign Frequency  Yellow: Q 2hr X 2 then Q 4hr X 2, if remains yellow, continue Q 4hrs  Escalate  MEWS: Escalate Yellow: discuss with charge nurse/RN and consider discussing with provider and RRT  Notify: Charge Nurse/RN  Name of Charge Nurse/RN Notified Tai RN  Date Charge Nurse/RN Notified 01/04/20  Time Charge Nurse/RN Notified [id number]  Notify: Provider  Provider Name/Title Broadus John MD  Date Provider Notified 01/04/20  Time Provider Notified 1640  Notification Type Face-to-face  Notification Reason Other (Comment) (arrival to unit  and make aware)  Response See new orders (PRBC )  Date of Provider Response 01/04/20  Time of Provider Response 1640  Notify: Rapid Response  Name of Rapid Response RN Notified [facility] RN  Date Rapid Response Notified 01/04/20  Time Rapid Response Notified 1632  Document  Patient Outcome Other (Comment)  Progress note created (see row info) Yes   Type and screen completed upon pt arrival to unit. Results pending.  PRBC to be administered.  Pt's girlfriend at bedside.  Dressing changed x2. Bloody drainage.

## 2020-01-04 NOTE — Progress Notes (Signed)
ANTICOAGULATION CONSULT NOTE  Pharmacy Consult for Heparin (Apixaban on hold) Indication: atrial fibrillation  No Known Allergies   Vital Signs: Temp: 97.9 F (36.6 C) (11/10 0627) Temp Source: Oral (11/10 0627) BP: 143/82 (11/10 0627) Pulse Rate: 72 (11/10 0627)  Labs: Recent Labs    01/01/20 2347 01/01/20 2347 01/02/20 0207 01/02/20 0500 01/02/20 0500 01/02/20 1344 01/02/20 1345 01/03/20 0226 01/04/20 0131  HGB 11.5*   < >  --  11.4*   < >  --   --  10.7* 9.6*  HCT 34.8*   < >  --  34.8*  --   --   --  32.5* 28.7*  PLT 157   < >  --  161  --   --   --  130* 114*  APTT  --   --   --   --   --   --  69*  --   --   HEPARINUNFRC  --   --   --   --   --  0.56  --  0.36 0.12*  CREATININE 0.87   < >  --  0.88  --   --   --  0.81 0.66  TROPONINIHS 5  --  4  --   --   --   --   --   --    < > = values in this interval not displayed.    Estimated Creatinine Clearance: 79.5 mL/min (by C-G formula based on SCr of 0.66 mg/dL).  Assessment: 79 y/o M transfer from outside hospital with intra-abdominal infection. He is on apixaban PTA for afib. Holding apixaban and starting heparin as pt will likely need surgery or procedure this admission. Pharmacy consulted to dose heparin. Monitoring by heparin levels alone now.   Heparin was held this AM for lap-chole. Post-op the plan is to continue to hold Heparin until the surgeon can re-evaluate on POD#1.   Goal of Therapy:  Heparin level 0.3-0.7 units/ml Monitor platelets by anticoagulation protocol: Yes   Plan:  - Continue to hold Heparin at this time - Surgeon planning to evaluate on POD#1 to determine restart plans - Will follow along with plans in the AM  Thank you for allowing pharmacy to be a part of this patient's care.  Alycia Rossetti, PharmD, BCPS Clinical Pharmacist Clinical phone for 01/04/2020: [phone] 01/04/2020 2:23 PM   **Pharmacist phone directory can now be found on [email or web address] ([email or web address]).  Listed under [place].

## 2020-01-04 NOTE — Progress Notes (Signed)
Pr c/o 10/10 left to mid abdominal pain, 0.5mg  dilaudid IV given, pain down to 5/10. Lactic acid 6.7, Dr. Marlowe Sax notified, awaiting call back, rapid response nurse Shanon Brow RN made aware. Will continue to monitor.

## 2020-01-04 NOTE — Progress Notes (Signed)
[facility] Progress Note  3 Days Post-Op  Subjective: CC-  Up in chair, wife at bedside. States that he feels better than yesterday. Denies abdominal pain, nausea, vomiting.  Lipase down to 119, LFTs also trending down.  Objective: Vital signs in last 24 hours: Temp:  [97.9 F (36.6 C)-98.4 F (36.9 C)] 97.9 F (36.6 C) (11/10 0627) Pulse Rate:  [70-72] 72 (11/10 0627) Resp:  [20] 20 (11/10 0627) BP: (128-159)/(76-91) 143/82 (11/10 0627) SpO2:  [95 %-96 %] 95 % (11/10 0627)    Intake/Output from previous day: 11/09 0701 - 11/10 0700 In: 3189.5 [I.V.:2903.3; IV Piggyback:286.2] Out: 1150 [Urine:1150] Intake/Output this shift: No intake/output data recorded.  PE: Gen: Alert, NAD Heart: regular Lungs: CTAB, rate and effort normal Abd: soft, protuberant, nontender, few BS heard  Lab Results:  Recent Labs    01/03/20 0226 01/04/20 0131  WBC 11.1* 10.1  HGB 10.7* 9.6*  HCT 32.5* 28.7*  PLT 130* 114*   BMET Recent Labs    01/03/20 0226 01/04/20 0131  NA 135 133*  K 3.7 3.5  CL 102 103  CO2 23 21*  GLUCOSE 124* 118*  BUN 11 9  CREATININE 0.81 0.66  CALCIUM 7.9* 7.7*   PT/INR No results for input(s): LABPROT, INR in the last 72 hours. CMP     Component Value Date/Time   NA 133 (L) 01/04/2020 0131   K 3.5 01/04/2020 0131   CL 103 01/04/2020 0131   CO2 21 (L) 01/04/2020 0131   GLUCOSE 118 (H) 01/04/2020 0131   BUN 9 01/04/2020 0131   CREATININE 0.66 01/04/2020 0131   CALCIUM 7.7 (L) 01/04/2020 0131   PROT 5.6 (L) 01/04/2020 0131   ALBUMIN 2.7 (L) 01/04/2020 0131   AST 33 01/04/2020 0131   ALT 63 (H) 01/04/2020 0131   ALKPHOS 61 01/04/2020 0131   BILITOT 1.4 (H) 01/04/2020 0131   GFRNONAA >60 01/04/2020 0131   GFRAA >60 06/19/2017 1633   Lipase     Component Value Date/Time   LIPASE 119 (H) 01/04/2020 0131       Studies/Results: No results found.  Anti-infectives: Anti-infectives (From admission, onward)   Start      Dose/Rate Route Frequency Ordered Stop   01/02/20 0345  piperacillin-tazobactam (ZOSYN) IVPB 3.375 g        3.375 g 12.5 mL/hr over 240 Minutes Intravenous Every 8 hours 01/02/20 0343         Assessment/Plan HTN HLD DM A fib - eliquis on hold, on heparin gtt  Biliary pancreatitis - Lipase down to 119 and abdominal exam improved. Plan to proceed with laparoscopic cholecystectomy with IOC today. Keep NPO. Continue holding heparin.  FEN - IVF, NPO VTE - heparin gtt held at 0630, eliquis on hold ID - zosyn  11/8>> Foley - none Follow up - TBD   LOS: 2 days    Wellington Hampshire, [facility] 01/04/2020, 8:43 AM Please see Amion for pager number during day hours 7:00am-4:30pm

## 2020-01-04 NOTE — Progress Notes (Addendum)
Post op hypotension, with BP of 78/56 when back from PACU Pt was drowsy but easily arousable and answering questions -completing first Litre of NS, will add another 535ml bolus and maintenance fluids, SBP now 94-105 range -EKG without ACS, Hb is 8 -suspect multifactorial from anesthesia, blood loss, narcs etc -continue IVF and transfuse blood, repeat CBC ordered, continue Zosyn -transfer to SDU for closer monitoring, CCS following closely -hold toprol  Domenic Polite, MD

## 2020-01-05 ENCOUNTER — Encounter (HOSPITAL_COMMUNITY): Payer: Self-pay | Admitting: Surgery

## 2020-01-05 DIAGNOSIS — I480 Paroxysmal atrial fibrillation: Secondary | ICD-10-CM | POA: Diagnosis not present

## 2020-01-05 DIAGNOSIS — K810 Acute cholecystitis: Secondary | ICD-10-CM | POA: Diagnosis not present

## 2020-01-05 LAB — COMPREHENSIVE METABOLIC PANEL
ALT: 38 U/L (ref 0–44)
AST: 21 U/L (ref 15–41)
Albumin: 2.3 g/dL — ABNORMAL LOW (ref 3.5–5.0)
Alkaline Phosphatase: 44 U/L (ref 38–126)
Anion gap: 9 (ref 5–15)
BUN: 16 mg/dL (ref 8–23)
CO2: 18 mmol/L — ABNORMAL LOW (ref 22–32)
Calcium: 7.1 mg/dL — ABNORMAL LOW (ref 8.9–10.3)
Chloride: 108 mmol/L (ref 98–111)
Creatinine, Ser: 0.92 mg/dL (ref 0.61–1.24)
GFR, Estimated: >60 mL/min (ref >60)
Glucose, Bld: 226 mg/dL — ABNORMAL HIGH (ref 70–99)
Potassium: 3.9 mmol/L (ref 3.5–5.1)
Sodium: 135 mmol/L (ref 135–145)
Total Bilirubin: 1.8 mg/dL — ABNORMAL HIGH (ref 0.3–1.2)
Total Protein: 4.8 g/dL — ABNORMAL LOW (ref 6.5–8.1)

## 2020-01-05 LAB — GLUCOSE, CAPILLARY
Glucose-Capillary: 150 mg/dL — ABNORMAL HIGH (ref 70–99)
Glucose-Capillary: 167 mg/dL — ABNORMAL HIGH (ref 70–99)
Glucose-Capillary: 208 mg/dL — ABNORMAL HIGH (ref 70–99)
Glucose-Capillary: 210 mg/dL — ABNORMAL HIGH (ref 70–99)
Glucose-Capillary: 220 mg/dL — ABNORMAL HIGH (ref 70–99)
Glucose-Capillary: 222 mg/dL — ABNORMAL HIGH (ref 70–99)

## 2020-01-05 LAB — CBC
HCT: 25.9 % — ABNORMAL LOW (ref 39.0–52.0)
HCT: 25.1 % — ABNORMAL LOW (ref 39.0–52.0)
Hemoglobin: 8.9 g/dL — ABNORMAL LOW (ref 13.0–17.0)
Hemoglobin: 8.6 g/dL — ABNORMAL LOW (ref 13.0–17.0)
MCH: 32.7 pg (ref 26.0–34.0)
MCH: 32.7 pg (ref 26.0–34.0)
MCHC: 34.4 g/dL (ref 30.0–36.0)
MCHC: 34.3 g/dL (ref 30.0–36.0)
MCV: 95.2 fL (ref 80.0–100.0)
MCV: 95.4 fL (ref 80.0–100.0)
Platelets: 116 10*3/uL — ABNORMAL LOW (ref 150–400)
Platelets: 138 10*3/uL — ABNORMAL LOW (ref 150–400)
RBC: 2.72 MIL/uL — ABNORMAL LOW (ref 4.22–5.81)
RBC: 2.63 MIL/uL — ABNORMAL LOW (ref 4.22–5.81)
RDW: 18.1 % — ABNORMAL HIGH (ref 11.5–15.5)
RDW: 18.6 % — ABNORMAL HIGH (ref 11.5–15.5)
WBC: 10.6 10*3/uL — ABNORMAL HIGH (ref 4.0–10.5)
WBC: 13.8 10*3/uL — ABNORMAL HIGH (ref 4.0–10.5)
nRBC: 0.3 % — ABNORMAL HIGH (ref 0.0–0.2)
nRBC: 0.1 % (ref 0.0–0.2)

## 2020-01-05 LAB — SURGICAL PATHOLOGY

## 2020-01-05 LAB — LIPASE, BLOOD: Lipase: 33 U/L (ref 11–51)

## 2020-01-05 MED ORDER — METOPROLOL SUCCINATE ER 25 MG PO TB24
25.0000 mg | ORAL_TABLET | Freq: Every day | ORAL | Status: DC
  Administered 2020-01-05 – 2020-01-08 (×4): 25 mg via ORAL
  Filled 2020-01-05 (×4): qty 1

## 2020-01-05 MED ORDER — INSULIN GLARGINE 100 UNIT/ML ~~LOC~~ SOLN
10.0000 [IU] | Freq: Every day | SUBCUTANEOUS | Status: DC
  Administered 2020-01-05 – 2020-01-07 (×3): 10 [IU] via SUBCUTANEOUS
  Filled 2020-01-05 (×5): qty 0.1

## 2020-01-05 MED ORDER — MUPIROCIN 2 % EX OINT
TOPICAL_OINTMENT | Freq: Two times a day (BID) | CUTANEOUS | Status: DC
  Filled 2020-01-05: qty 22

## 2020-01-05 MED ORDER — SODIUM CHLORIDE 0.9 % IV SOLN: INTRAVENOUS | Status: DC

## 2020-01-05 MED ORDER — HYDROMORPHONE HCL 1 MG/ML IJ SOLN
1.0000 mg | INTRAMUSCULAR | Status: DC | PRN
  Administered 2020-01-05 – 2020-01-06 (×4): 1 mg via INTRAVENOUS
  Filled 2020-01-05 (×5): qty 1

## 2020-01-05 NOTE — Progress Notes (Signed)
Patient's dressing to right upper quadrant  JP drain site was soaked with blood, dressing changed.

## 2020-01-05 NOTE — Progress Notes (Addendum)
Patient stated that is still having pain 4/10.  Site continues to bleed. per night shift RN, dressing was changed at 0630.   Patient is irritable and agitated, stating that wants to eat and wants to get out of bed.  Nurse educated patient regarding bleeding and importance of calling nurse for assistance. Patient's partner verbalizes understanding, however patient stated that he was upset.   Call bell within reach, bed alarm in place.   Will continue to monitor.

## 2020-01-05 NOTE — Progress Notes (Signed)
Dr. Marlowe Sax called back, no new orders. Will continue to monitor.

## 2020-01-05 NOTE — Progress Notes (Signed)
Patient's dressing changed twice today. Ice pack placed on the RUQ area. Pt denies pain at this time.   130cc output from jp bulb (7a-7p).

## 2020-01-05 NOTE — Progress Notes (Signed)
Pt is retless, agitated, requesting food, wants to get out of bed, pt is NPO. Pt also c/o 10/10 sharp abdominal pain, abdomen firm, tender on palpation, dilaudid 0.5mg  IV given, pt still in pain. Rapid response nurse Kerrie Pleasure made aware, additional dose of dilaudid 0.5mg  IV given. Dr. Marlowe Sax paged, awaiting call back.

## 2020-01-05 NOTE — Anesthesia Postprocedure Evaluation (Signed)
Anesthesia Post Note  Patient: Andrew Humphrey  Procedure(s) Performed: LAPAROSCOPIC CHOLECYSTECTOMY (N/A Abdomen)     Patient location during evaluation: PACU Anesthesia Type: General Level of consciousness: awake and alert Pain management: pain level controlled Vital Signs Assessment: post-procedure vital signs reviewed and stable Respiratory status: spontaneous breathing, nonlabored ventilation, respiratory function stable and patient connected to nasal cannula oxygen Cardiovascular status: blood pressure returned to baseline and stable Postop Assessment: no apparent nausea or vomiting Anesthetic complications: no   No complications documented.  Last Vitals:  Vitals:   01/05/20 0200 01/05/20 0400  BP: (!) 141/72 121/75  Pulse: 70 71  Resp: 17 17  Temp: 36.6 C 36.7 C  SpO2: 98% 97%    Last Pain:  Vitals:   01/05/20 0923  TempSrc:   PainSc: 2                  Caisen Mangas

## 2020-01-05 NOTE — Progress Notes (Addendum)
Progress Note  Patient Name: Andrew Humphrey Date of Encounter: 01/05/2020  Primary Cardiologist: Andrew Lesches, MD  Subjective   Now post cholecystectomy>>drain remains in place. Per surgery, continue to hold anticoagulation. No recurrent AF on telemetry   Inpatient Medications    Scheduled Meds: . sodium chloride   Intravenous Once  . amiodarone  200 mg Oral Daily  . insulin aspart  0-9 Units Subcutaneous Q4H  . sodium chloride flush  3 mL Intravenous Q12H   Continuous Infusions: . sodium chloride 100 mL/hr at 01/05/20 0004  . famotidine (PEPCID) IV 20 mg (01/05/20 0921)  . piperacillin-tazobactam (ZOSYN)  IV 3.375 g (01/05/20 0602)   PRN Meds: acetaminophen **OR** acetaminophen, HYDROmorphone (DILAUDID) injection, methocarbamol, ondansetron **OR** ondansetron (ZOFRAN) IV, oxyCODONE   Vital Signs    Vitals:   01/04/20 2200 01/05/20 0200 01/05/20 0238 01/05/20 0400  BP: 116/75 (!) 141/72  121/75  Pulse: 64 70  71  Resp: 15 17  17   Temp: 98.1 F (36.7 C) 97.9 F (36.6 C)  98.1 F (36.7 C)  TempSrc: Oral Oral  Oral  SpO2: 97% 98%  97%  Weight:   87.8 kg   Height:        Intake/Output Summary (Last 24 hours) at 01/05/2020 0949 Last data filed at 01/05/2020 0920 Gross per 24 hour  Intake 2370.5 ml  Output 1615 ml  Net 755.5 ml   Filed Weights   01/02/20 0758 01/04/20 1030 01/05/20 0238  Weight: 82.6 kg 82.6 kg 87.8 kg    Physical Exam   General: Well developed, well nourished, NAD Neck: Negative for carotid bruits. No JVD Lungs:Clear to ausculation bilaterally. No wheezes, rales, or rhonchi. Breathing is unlabored. Cardiovascular: RRR with S1 S2. No murmur Abdomen: Soft, non-tender, non-distended. No obvious abdominal masses. Extremities: No edema. Radial pulses 2+ bilaterally Neuro: Alert and oriented. No focal deficits. No facial asymmetry. MAE spontaneously. Psych: Responds to questions appropriately with normal affect.    Labs     Chemistry Recent Labs  Lab 01/03/20 0226 01/04/20 0131 01/05/20 0218  NA 135 133* 135  K 3.7 3.5 3.9  CL 102 103 108  CO2 23 21* 18*  GLUCOSE 124* 118* 226*  BUN 11 9 16   CREATININE 0.81 0.66 0.92  CALCIUM 7.9* 7.7* 7.1*  PROT 6.0* 5.6* 4.8*  ALBUMIN 3.0* 2.7* 2.3*  AST 76* 33 21  ALT 102* 63* 38  ALKPHOS 79 61 44  BILITOT 1.6* 1.4* 1.8*  GFRNONAA >60 >60 >60  ANIONGAP 10 9 9      Hematology Recent Labs  Lab 01/04/20 1443 01/04/20 1928 01/05/20 0218  WBC 19.6* 16.7* 10.6*  RBC 2.47* 2.71* 2.72*  HGB 8.0* 8.7* 8.9*  HCT 24.9* 27.5* 25.9*  MCV 100.8* 101.5* 95.2  MCH 32.4 32.1 32.7  MCHC 32.1 31.6 34.4  RDW 20.8* 19.4* 18.1*  PLT 166 137* 116*    Cardiac EnzymesNo results for input(s): TROPONINI in the last 168 hours. No results for input(s): TROPIPOC in the last 168 hours.   BNPNo results for input(s): BNP, PROBNP in the last 168 hours.   DDimer No results for input(s): DDIMER in the last 168 hours.   Radiology    DG Abd Portable 1V  Result Date: 01/04/2020 CLINICAL DATA:  Lower abdominal pain EXAM: PORTABLE ABDOMEN - 1 VIEW COMPARISON:  None. FINDINGS: No dilated large or small bowel. There is contrast within the colon which is nondistended. Gas in the rectum. IMPRESSION: No evidence of bowel  obstruction. Electronically Signed   By: Suzy Bouchard M.D.   On: 01/04/2020 20:34   Telemetry    01/05/20 NSR/SB - Personally Reviewed  ECG    No new tracing as of 01/05/20 - Personally Reviewed  Cardiac Studies   ECHO:  12/09/2019  1. MIld distal septal hypokinesis. . Left ventricular ejection fraction,  by estimation, is 55 to 60%. The left ventricle has normal function. The  left ventricle demonstrates regional wall motion abnormalities (see  scoring diagram/findings for  description). There is mild left ventricular hypertrophy. Left ventricular  diastolic parameters were normal.  2. Right ventricular systolic function is mildly reduced. The right   ventricular size is normal. There is mildly elevated pulmonary artery  systolic pressure.  3. Left atrial size was mild to moderately dilated.  4. The mitral valve is abnormal. Moderate mitral valve regurgitation.  5. The aortic valve is abnormal. Aortic valve regurgitation is trivial.  Mild to moderate aortic valve sclerosis/calcification is present, without  any evidence of aortic stenosis.  6. The inferior vena cava is dilated in size with <50% respiratory  variability, suggesting right atrial pressure of 15 mmHg.   Patient Profile     79 y.o. male w/ hx PAF, HTN, HLD, GI bleed, and DMwho was admitted 11/08 with acute cholecystitis (failed outpt med rx), lap choley planned.  Assessment & Plan    1. Acute cholecystitis: -Now post op day 1 with drain that remains in place given the amount of purulence in the gallbladder -LFTs within normal range  -Surgery to leave drain in place   2. Hx PAF: -Continues to maintain NSR/SB on telemetry  -Hold anticoagulation until okay from a surgical standpoint>no concern for bleeding per their note -May need Heparin infusion if no PO   -CHA2DS2-VASc = 4 (age x 2, DM, HTN)   Other hospital issues per IM/sugery teams: -Acute cholecystitis -Active Problems: -Diabetes mellitus without complication ([place]) -Acute biliary pancreatitis    Signed, Andrew Drown NP-C [place] Pager: [phone] 01/05/2020, 9:49 AM     For questions or updates, please contact   Please consult [email or web address] for contact info under Cardiology/STEMI.  History and all data above reviewed.  Patient examined.  I agree with the findings as above.  Mild pain.  Taking in clear liquids.  The patient exam reveals COR:RRR  ,  Lungs: Decreased breath sounds  ,  Abd: Distended with decreased bowel sounds, Ext Mild edema  .  All available labs, radiology testing, previous records reviewed. Agree with documented assessment and plan.   History of atrial fib:  No  arrhythmias.  No further cardiac suggestions.  Please call with further questions.  Resume blood thinners when able.   Andrew Humphrey  12:10 PM  01/05/2020

## 2020-01-05 NOTE — Progress Notes (Signed)
Patient is in a clear liquid diet, pt drink his soup and denied any abdominal pain or discomfort at this time.   Will continue to monitor.

## 2020-01-05 NOTE — Progress Notes (Signed)
JP drained 380cc bloody drainage from 7p-7a. Dr. Marlowe Sax made aware.

## 2020-01-05 NOTE — Progress Notes (Signed)
1 Day Post-Op   Subjective/Chief Complaint: Feels better than yesterday Pain better Wants to eat SBP better   Objective: Vital signs in last 24 hours: Temp:  [97.5 F (36.4 C)-98.1 F (36.7 C)] 98.1 F (36.7 C) (11/11 0400) Pulse Rate:  [56-78] 71 (11/11 0400) Resp:  [10-23] 17 (11/11 0400) BP: (70-186)/(45-89) 121/75 (11/11 0400) SpO2:  [95 %-100 %] 97 % (11/11 0400) Weight:  [82.6 kg-87.8 kg] 87.8 kg (11/11 0238) Last BM Date: 01/04/20  Intake/Output from previous day: 11/10 0701 - 11/11 0700 In: 2370.5 [I.V.:1604.1; Blood:699.2; IV Piggyback:67.2] Out: 1535 [Urine:850; Drains:680; Blood:5] Intake/Output this shift: Total I/O In: -  Out: 80 [Drains:80]  Exam: Awake and alert Looks much better Abdomen soft, drain without bile, still serosang/blood  Lab Results:  Recent Labs    01/04/20 1928 01/05/20 0218  WBC 16.7* 10.6*  HGB 8.7* 8.9*  HCT 27.5* 25.9*  PLT 137* 116*   BMET Recent Labs    01/04/20 0131 01/05/20 0218  NA 133* 135  K 3.5 3.9  CL 103 108  CO2 21* 18*  GLUCOSE 118* 226*  BUN 9 16  CREATININE 0.66 0.92  CALCIUM 7.7* 7.1*   PT/INR No results for input(s): LABPROT, INR in the last 72 hours. ABG No results for input(s): PHART, HCO3 in the last 72 hours.  Invalid input(s): PCO2, PO2  Studies/Results: DG Abd Portable 1V  Result Date: 01/04/2020 CLINICAL DATA:  Lower abdominal pain EXAM: PORTABLE ABDOMEN - 1 VIEW COMPARISON:  None. FINDINGS: No dilated large or small bowel. There is contrast within the colon which is nondistended. Gas in the rectum. IMPRESSION: No evidence of bowel obstruction. Electronically Signed   By: Suzy Bouchard M.D.   On: 01/04/2020 20:34    Anti-infectives: Anti-infectives (From admission, onward)   Start     Dose/Rate Route Frequency Ordered Stop   01/02/20 0345  piperacillin-tazobactam (ZOSYN) IVPB 3.375 g        3.375 g 12.5 mL/hr over 240 Minutes Intravenous Every 8 hours 01/02/20 0343         Assessment/Plan: s/p Procedure(s): LAPAROSCOPIC CHOLECYSTECTOMY (N/A)   I think he had a significant SIRS response post op given significant inflammation and purulence found in the gallbladder as well as the pancreatitis. He has responded well to resuscitation. I don't think he is activity bleeding but would hold anticoagulation today. LFTs mostly normal, lipase normal Will leave drain as he is at risk for post op bile leak Advance diet  LOS: 3 days    Coralie Keens 01/05/2020

## 2020-01-05 NOTE — Consult Note (Signed)
   [facility] Inpatient Consult   01/05/2020  Andrew Humphrey 10-08-1940 [id number]   [place] Organization [ACO] Patient: [employer]  Patient is showing as pending status with [facility] for chronic disease management services.  Patient has had outreach attempts for within the last 30 days from the General EMMI Discharge calls without success of contact. Patient has readmitted within the past 30 days Patient reviewed for needs for complex disease management.  Patient is currently listed with [facility] for Primary Care Provider in the [facility] providers with Lanelle Bal, PA-C, this provider is listed to the [facility] follow up.  Plan: Follow for progress and needs for post hospital follow up needs. Continue to follow with  Inpatient Transition Of Care [TOC] team member and to make aware that [place] [facility] following.   Of note, [facility] Care Management services does not replace or interfere with any services that are needed or arranged by inpatient [facility] care management team.  For additional questions or referrals please contact:   Natividad Brood, RN BSN [facility] Liaison  [phone] business mobile phone Toll free office [phone]  Fax number: [phone] [email or web address]

## 2020-01-05 NOTE — Progress Notes (Signed)
PROGRESS NOTE    Andrew Humphrey  VPX:[id number] DOB: 04/29/1940 DOA: 01/01/2020 PCP: Lanelle Bal, PA-C   Chief Complain: Abdominal pain, nausea, vomiting  Brief Narrative: Patient is a 79 year old male with history of atrial fibrillation on Eliquis, diabetes mellitus type 2, hypertension, recent history of Klebsiella bacteremia secondary to cholecystitis who presented to the emergency department with complaints of upper abdominal pain, nausea, vomiting.  He was recently admitted [facility] for similar complaint, found to have cholecystitis and underwent conservative management and was discharged to home.  He was evaluated at outside hospital where EKG was concerning for STEMI and was sent to the emergency department for cardiology evaluation.  EKG features resolved at the time he presented to [facility].  Troponins were normal, code STEMI canceled. -He was found to have elevated LFTs bilirubin and lipase of 3987 -Right upper quadrant ultrasound noted gallbladder sludge, gallbladder wall thickening and positive Murphys sign  -Underwent lap chole 11/10, surgery was complicated, noted to have significant inflammation, purulent drainage and bleeding, IOC not completed, drain placed -Postop 10/10 with persistent hypotension finally responded to 3 L of IV fluids and 2 units of blood  Assessment & Plan:   Acute biliary pancreatitis:  -Admitted with abdominal pain nausea and vomiting associated with elevated lipase LFTs and bilirubin  - Ultrasound showed acute cholecystitis with no biliary dilation. -General surgery following, underwent lap chole without IOC 46/27, complicated by severe postop hypotension likely from SIRS and blood loss -Given 3 L of IV fluids and 2 units of PRBC, blood pressure is stable now will cut down IV fluids -Diet per CCS -Continue drain, at risk of bile leak, abscesses -Labs in a.m. -Out of bed to chair  Paroxysmal A. fib: Currently in normal sinus  rhythm.CHADS-VASc at least 28 (age x2, DM)  on amiodarone and metoprolol.   -Eliquis on hold, would avoid anticoagulation at least for a few days in the setting of complicated surgery yesterday  Diabetes type 2: Hemoglobin A1c was 9.9 in October 2021.  -CBGs in the 200s, will add low-dose Lantus  DVT prophylaxis: SCDs Code Status: Full Family Communication: Wife at bedside Status is: Inpatient  Remains inpatient appropriate because:Inpatient level of care appropriate due to severity of illness   Dispo: The patient is from: Home              Anticipated d/c is to: Home              Anticipated d/c date is: Likely 3 to 4 days              Patient currently is not medically stable to d/c.  Waiting for cholecystectomy  Consultants: None  Procedures:None  Antimicrobials:  Anti-infectives (From admission, onward)   Start     Dose/Rate Route Frequency Ordered Stop   01/02/20 0345  piperacillin-tazobactam (ZOSYN) IVPB 3.375 g        3.375 g 12.5 mL/hr over 240 Minutes Intravenous Every 8 hours 01/02/20 0343        Subjective: -Upset about not getting enough pain medicines last night, does not recall being hypotensive or sick  Objective: Vitals:   01/04/20 2200 01/05/20 0200 01/05/20 0238 01/05/20 0400  BP: 116/75 (!) 141/72  121/75  Pulse: 64 70  71  Resp: 15 17  17   Temp: 98.1 F (36.7 C) 97.9 F (36.6 C)  98.1 F (36.7 C)  TempSrc: Oral Oral  Oral  SpO2: 97% 98%  97%  Weight:   87.8  kg   Height:        Intake/Output Summary (Last 24 hours) at 01/05/2020 1016 Last data filed at 01/05/2020 0920 Gross per 24 hour  Intake 1954.17 ml  Output 1615 ml  Net 339.17 ml   Filed Weights   01/02/20 0758 01/04/20 1030 01/05/20 0238  Weight: 82.6 kg 82.6 kg 87.8 kg    Examination:  Gen: Middle-aged male sitting up in bed, awake alert oriented x3, no distress HEENT: Neck supple, no JVD CVS: S1-S2, regular rhythm, Lungs: Decreased breath sounds both bases Abdomen: Firm,  distended, right upper quadrant drain noted with bleeding and soaked dressing, drain with bloody output Extremities: No edema  Skin: no new rashes    Data Reviewed: I have personally reviewed following labs and imaging studies  CBC: Recent Labs  Lab 01/03/20 0226 01/04/20 0131 01/04/20 1443 01/04/20 1928 01/05/20 0218  WBC 11.1* 10.1 19.6* 16.7* 10.6*  HGB 10.7* 9.6* 8.0* 8.7* 8.9*  HCT 32.5* 28.7* 24.9* 27.5* 25.9*  MCV 97.6 98.0 100.8* 101.5* 95.2  PLT 130* 114* 166 137* 948*   Basic Metabolic Panel: Recent Labs  Lab 01/01/20 2347 01/02/20 0500 01/03/20 0226 01/04/20 0131 01/05/20 0218  NA 135 134* 135 133* 135  K 4.3 4.5 3.7 3.5 3.9  CL 102 102 102 103 108  CO2 21* 20* 23 21* 18*  GLUCOSE 225* 232* 124* 118* 226*  BUN 12 13 11 9 16   CREATININE 0.87 0.88 0.81 0.66 0.92  CALCIUM 8.6* 8.4* 7.9* 7.7* 7.1*   GFR: Estimated Creatinine Clearance: 71.1 mL/min (by C-G formula based on SCr of 0.92 mg/dL). Liver Function Tests: Recent Labs  Lab 01/01/20 2347 01/02/20 0500 01/03/20 0226 01/04/20 0131 01/05/20 0218  AST 220* 183* 76* 33 21  ALT 160* 159* 102* 63* 38  ALKPHOS 80 76 79 61 44  BILITOT 3.7* 4.0* 1.6* 1.4* 1.8*  PROT 6.7 6.7 6.0* 5.6* 4.8*  ALBUMIN 3.5 3.4* 3.0* 2.7* 2.3*   Recent Labs  Lab 01/01/20 2347 01/02/20 0500 01/03/20 0226 01/04/20 0131 01/05/20 0218  LIPASE 3,987* 1,719* 663* 119* 33   No results for input(s): AMMONIA in the last 168 hours. Coagulation Profile: No results for input(s): INR, PROTIME in the last 168 hours. Cardiac Enzymes: No results for input(s): CKTOTAL, CKMB, CKMBINDEX, TROPONINI in the last 168 hours. BNP (last 3 results) No results for input(s): PROBNP in the last 8760 hours. HbA1C: No results for input(s): HGBA1C in the last 72 hours. CBG: Recent Labs  Lab 01/04/20 1631 01/04/20 2031 01/05/20 0006 01/05/20 0352 01/05/20 0736  GLUCAP 188* 221* 222* 210* 220*   Lipid Profile: No results for input(s):  CHOL, HDL, LDLCALC, TRIG, CHOLHDL, LDLDIRECT in the last 72 hours. Thyroid Function Tests: No results for input(s): TSH, T4TOTAL, FREET4, T3FREE, THYROIDAB in the last 72 hours. Anemia Panel: No results for input(s): VITAMINB12, FOLATE, FERRITIN, TIBC, IRON, RETICCTPCT in the last 72 hours. Sepsis Labs: Recent Labs  Lab 01/04/20 2004 01/04/20 2228  LATICACIDVEN 6.7* 3.9*    Recent Results (from the past 240 hour(s))  Respiratory Panel by RT PCR (Flu A&B, Covid) - Nasopharyngeal Swab     Status: None   Collection Time: 01/02/20  3:28 AM   Specimen: Nasopharyngeal Swab  Result Value Ref Range Status   SARS Coronavirus 2 by RT PCR NEGATIVE NEGATIVE Final    Comment: (NOTE) SARS-CoV-2 target nucleic acids are NOT DETECTED.  The SARS-CoV-2 RNA is generally detectable in upper respiratoy specimens during the acute phase of  infection. The lowest concentration of SARS-CoV-2 viral copies this assay can detect is 131 copies/mL. A negative result does not preclude SARS-Cov-2 infection and should not be used as the sole basis for treatment or other patient management decisions. A negative result may occur with  improper specimen collection/handling, submission of specimen other than nasopharyngeal swab, presence of viral mutation(s) within the areas targeted by this assay, and inadequate number of viral copies (<131 copies/mL). A negative result must be combined with clinical observations, patient history, and epidemiological information. The expected result is Negative.  Fact Sheet for Patients:  PinkCheek.be  Fact Sheet for Healthcare Providers:  GravelBags.it  This test is no t yet approved or cleared by the [place] FDA and  has been authorized for detection and/or diagnosis of SARS-CoV-2 by FDA under an Emergency Use Authorization (EUA). This EUA will remain  in effect (meaning this test can be used) for the duration  of the COVID-19 declaration under Section 564(b)(1) of the Act, 21 U.S.C. section 360bbb-3(b)(1), unless the authorization is terminated or revoked sooner.     Influenza A by PCR NEGATIVE NEGATIVE Final   Influenza B by PCR NEGATIVE NEGATIVE Final    Comment: (NOTE) The Xpert Xpress SARS-CoV-2/FLU/RSV assay is intended as an aid in  the diagnosis of influenza from Nasopharyngeal swab specimens and  should not be used as a sole basis for treatment. Nasal washings and  aspirates are unacceptable for Xpert Xpress SARS-CoV-2/FLU/RSV  testing.  Fact Sheet for Patients: PinkCheek.be  Fact Sheet for Healthcare Providers: GravelBags.it  This test is not yet approved or cleared by the [place] FDA and  has been authorized for detection and/or diagnosis of SARS-CoV-2 by  FDA under an Emergency Use Authorization (EUA). This EUA will remain  in effect (meaning this test can be used) for the duration of the  Covid-19 declaration under Section 564(b)(1) of the Act, 21  U.S.C. section 360bbb-3(b)(1), unless the authorization is  terminated or revoked. Performed at [facility], [place] [address] [place], [place] [address]   Culture, blood (routine x 2)     Status: None (Preliminary result)   Collection Time: 01/02/20  9:25 AM   Specimen: BLOOD  Result Value Ref Range Status   Specimen Description BLOOD SITE NOT SPECIFIED  Final   Special Requests   Final    BOTTLES DRAWN AEROBIC AND ANAEROBIC Blood Culture results may not be optimal due to an inadequate volume of blood received in culture bottles   Culture   Final    NO GROWTH 3 DAYS Performed at [facility], [place] [address], [place], [place] [address]    Report Status PENDING  Incomplete  Culture, blood (routine x 2)     Status: None (Preliminary result)   Collection Time: 01/02/20  9:39 AM   Specimen: BLOOD RIGHT HAND  Result Value Ref Range Status   Specimen  Description BLOOD RIGHT HAND  Final   Special Requests   Final    BOTTLES DRAWN AEROBIC AND ANAEROBIC Blood Culture adequate volume   Culture   Final    NO GROWTH 3 DAYS Performed at [facility], [place] [address] [place], [place] [address]    Report Status PENDING  Incomplete  Surgical pcr screen     Status: Abnormal   Collection Time: 01/04/20 10:03 AM   Specimen: Nasal Mucosa; Nasal Swab  Result Value Ref Range Status   MRSA, PCR NEGATIVE NEGATIVE Final   Staphylococcus aureus  POSITIVE (A) NEGATIVE Final    Comment: (NOTE) The Xpert SA Assay (FDA approved for NASAL specimens in patients 95 years of age and older), is one component of a comprehensive surveillance program. It is not intended to diagnose infection nor to guide or monitor treatment. Performed at [facility], [place] [address], [place], [place] [address]      Scheduled Meds: . sodium chloride   Intravenous Once  . amiodarone  200 mg Oral Daily  . insulin aspart  0-9 Units Subcutaneous Q4H  . metoprolol succinate  25 mg Oral Daily  . sodium chloride flush  3 mL Intravenous Q12H   Continuous Infusions: . sodium chloride    . famotidine (PEPCID) IV 20 mg (01/05/20 0921)  . piperacillin-tazobactam (ZOSYN)  IV 3.375 g (01/05/20 0602)     LOS: 3 days    Time spent: 77min   Domenic Polite, MD [facility] P11/12/2019, 10:16 AM

## 2020-01-05 NOTE — Progress Notes (Signed)
[facility] Progress Note  1 Day Post-Op  Subjective: CC-  Wife at bedside. States that his abdomen is sore. Denies any n/v. He had a loose BM yesterday and passed some flatus.  Hgb 8.9<<8.7<<8 after 2 units PRBCs last night, from 9.6 preop. Continues to ooze around JP drain. Dressing changed around 0300 and is again soaked this AM. 680cc drainage documented from [place]. BP 130s/80s, HR 70s while I was in the room.  Objective: Vital signs in last 24 hours: Temp:  [97.5 F (36.4 C)-98.1 F (36.7 C)] 98.1 F (36.7 C) (11/11 0400) Pulse Rate:  [56-78] 71 (11/11 0400) Resp:  [10-23] 17 (11/11 0400) BP: (70-186)/(45-89) 121/75 (11/11 0400) SpO2:  [95 %-100 %] 97 % (11/11 0400) Weight:  [82.6 kg-87.8 kg] 87.8 kg (11/11 0238) Last BM Date: 01/04/20  Intake/Output from previous day: 11/10 0701 - 11/11 0700 In: 2370.5 [I.V.:1604.1; Blood:699.2; IV Piggyback:67.2] Out: 0174 [Urine:850; Drains:680; Blood:5] Intake/Output this shift: No intake/output data recorded.  PE: Gen:  Alert, NAD, pleasant Card:  RRR Pulm:  rate and effort normal Abd: Soft, mild distension, minimal tenderness, few BS heard, lap incisions cdi, JP drain with bloody/ serosanguinous fluid in bulb Psych: A&Ox4   Lab Results:  Recent Labs    01/04/20 1928 01/05/20 0218  WBC 16.7* 10.6*  HGB 8.7* 8.9*  HCT 27.5* 25.9*  PLT 137* 116*   BMET Recent Labs    01/04/20 0131 01/05/20 0218  NA 133* 135  K 3.5 3.9  CL 103 108  CO2 21* 18*  GLUCOSE 118* 226*  BUN 9 16  CREATININE 0.66 0.92  CALCIUM 7.7* 7.1*   PT/INR No results for input(s): LABPROT, INR in the last 72 hours. CMP     Component Value Date/Time   NA 135 01/05/2020 0218   K 3.9 01/05/2020 0218   CL 108 01/05/2020 0218   CO2 18 (L) 01/05/2020 0218   GLUCOSE 226 (H) 01/05/2020 0218   BUN 16 01/05/2020 0218   CREATININE 0.92 01/05/2020 0218   CALCIUM 7.1 (L) 01/05/2020 0218   PROT 4.8 (L) 01/05/2020 0218   ALBUMIN 2.3 (L)  01/05/2020 0218   AST 21 01/05/2020 0218   ALT 38 01/05/2020 0218   ALKPHOS 44 01/05/2020 0218   BILITOT 1.8 (H) 01/05/2020 0218   GFRNONAA >60 01/05/2020 0218   GFRAA >60 06/19/2017 1633   Lipase     Component Value Date/Time   LIPASE 33 01/05/2020 0218       Studies/Results: DG Abd Portable 1V  Result Date: 01/04/2020 CLINICAL DATA:  Lower abdominal pain EXAM: PORTABLE ABDOMEN - 1 VIEW COMPARISON:  None. FINDINGS: No dilated large or small bowel. There is contrast within the colon which is nondistended. Gas in the rectum. IMPRESSION: No evidence of bowel obstruction. Electronically Signed   By: Suzy Bouchard M.D.   On: 01/04/2020 20:34    Anti-infectives: Anti-infectives (From admission, onward)   Start     Dose/Rate Route Frequency Ordered Stop   01/02/20 0345  piperacillin-tazobactam (ZOSYN) IVPB 3.375 g        3.375 g 12.5 mL/hr over 240 Minutes Intravenous Every 8 hours 01/02/20 0343         Assessment/Plan HTN HLD DM A fib - eliquis on hold ABL anemia - Hgb 8.9 this AM after 2 units PRBCs last night, VSS  Biliary pancreatitis, cholecystitis S/p laparoscopic cholecystectomy with drain placement 11/10 Dr. Ninfa Linden - POD#1 - unable to perform IOC, will trend LFTs postop. Tbili 1.8  today from 1.4, repeat CMP in AM - lipase WNL - Hgb seems stable and BP/HR look good this AM. Continue IVF and zosyn. May be able to trial clears today, but will wait until Dr. Ninfa Linden evaluates.  FEN -IVF, NPO VTE -SCDs only for now, continue to hold heparin gtt and eliquis ID -[email or web address]  11/8>> Foley - none Follow up - TBD   LOS: 3 days    Wellington Hampshire, [facility] 01/05/2020, 8:59 AM Please see Amion for pager number during day hours 7:00am-4:30pm

## 2020-01-06 DIAGNOSIS — K810 Acute cholecystitis: Secondary | ICD-10-CM | POA: Diagnosis not present

## 2020-01-06 LAB — GLUCOSE, CAPILLARY
Glucose-Capillary: 150 mg/dL — ABNORMAL HIGH (ref 70–99)
Glucose-Capillary: 153 mg/dL — ABNORMAL HIGH (ref 70–99)
Glucose-Capillary: 153 mg/dL — ABNORMAL HIGH (ref 70–99)
Glucose-Capillary: 113 mg/dL — ABNORMAL HIGH (ref 70–99)
Glucose-Capillary: 157 mg/dL — ABNORMAL HIGH (ref 70–99)
Glucose-Capillary: 142 mg/dL — ABNORMAL HIGH (ref 70–99)

## 2020-01-06 LAB — CBC
HCT: 21.5 % — ABNORMAL LOW (ref 39.0–52.0)
HCT: 22.6 % — ABNORMAL LOW (ref 39.0–52.0)
Hemoglobin: 7.3 g/dL — ABNORMAL LOW (ref 13.0–17.0)
Hemoglobin: 7.6 g/dL — ABNORMAL LOW (ref 13.0–17.0)
MCH: 32.7 pg (ref 26.0–34.0)
MCH: 32.5 pg (ref 26.0–34.0)
MCHC: 34 g/dL (ref 30.0–36.0)
MCHC: 33.6 g/dL (ref 30.0–36.0)
MCV: 96.4 fL (ref 80.0–100.0)
MCV: 96.6 fL (ref 80.0–100.0)
Platelets: 123 10*3/uL — ABNORMAL LOW (ref 150–400)
Platelets: 132 10*3/uL — ABNORMAL LOW (ref 150–400)
RBC: 2.23 MIL/uL — ABNORMAL LOW (ref 4.22–5.81)
RBC: 2.34 MIL/uL — ABNORMAL LOW (ref 4.22–5.81)
RDW: 18.9 % — ABNORMAL HIGH (ref 11.5–15.5)
RDW: 18.9 % — ABNORMAL HIGH (ref 11.5–15.5)
WBC: 11.6 10*3/uL — ABNORMAL HIGH (ref 4.0–10.5)
WBC: 12.3 10*3/uL — ABNORMAL HIGH (ref 4.0–10.5)
nRBC: 0.3 % — ABNORMAL HIGH (ref 0.0–0.2)
nRBC: 0.2 % (ref 0.0–0.2)

## 2020-01-06 LAB — COMPREHENSIVE METABOLIC PANEL
ALT: 31 U/L (ref 0–44)
AST: 20 U/L (ref 15–41)
Albumin: 2.4 g/dL — ABNORMAL LOW (ref 3.5–5.0)
Alkaline Phosphatase: 43 U/L (ref 38–126)
Anion gap: 10 (ref 5–15)
BUN: 14 mg/dL (ref 8–23)
CO2: 20 mmol/L — ABNORMAL LOW (ref 22–32)
Calcium: 7.6 mg/dL — ABNORMAL LOW (ref 8.9–10.3)
Chloride: 108 mmol/L (ref 98–111)
Creatinine, Ser: 0.87 mg/dL (ref 0.61–1.24)
GFR, Estimated: >60 mL/min (ref >60)
Glucose, Bld: 157 mg/dL — ABNORMAL HIGH (ref 70–99)
Potassium: 3.4 mmol/L — ABNORMAL LOW (ref 3.5–5.1)
Sodium: 138 mmol/L (ref 135–145)
Total Bilirubin: 0.9 mg/dL (ref 0.3–1.2)
Total Protein: 5.1 g/dL — ABNORMAL LOW (ref 6.5–8.1)

## 2020-01-06 MED ORDER — INSULIN ASPART 100 UNIT/ML ~~LOC~~ SOLN
0.0000 [IU] | Freq: Three times a day (TID) | SUBCUTANEOUS | Status: DC
  Administered 2020-01-07: 1 [IU] via SUBCUTANEOUS
  Administered 2020-01-07 (×2): 2 [IU] via SUBCUTANEOUS
  Administered 2020-01-08: 1 [IU] via SUBCUTANEOUS

## 2020-01-06 MED ORDER — INSULIN ASPART 100 UNIT/ML ~~LOC~~ SOLN: 0.0000 [IU] | Freq: Every day | SUBCUTANEOUS | Status: DC

## 2020-01-06 MED ORDER — MELATONIN 3 MG PO TABS
3.0000 mg | ORAL_TABLET | Freq: Every day | ORAL | Status: DC
  Administered 2020-01-06 – 2020-01-07 (×2): 3 mg via ORAL
  Filled 2020-01-06 (×2): qty 1

## 2020-01-06 NOTE — Progress Notes (Signed)
Patient walked with PT, and  is now sitting up in chair.

## 2020-01-06 NOTE — Progress Notes (Signed)
PROGRESS NOTE    Andrew Humphrey  XQJ:[id number] DOB: 1940/12/03 DOA: 01/01/2020 PCP: Lanelle Bal, PA-C   Chief Complain: Abdominal pain, nausea, vomiting  Brief Narrative: Patient is a [name] with history of atrial fibrillation on Eliquis, diabetes mellitus type 2, hypertension, recent history of Klebsiella bacteremia secondary to cholecystitis who presented to the emergency department with upper abdominal pain, nausea, vomiting.  He was [facility] for similar complaint, found to have cholecystitis and underwent conservative management and was discharged to home.  He was evaluated at outside hospital where EKG was concerning for STEMI and was sent to the emergency department for cardiology evaluation.  EKG features resolved at the time he presented to [facility].  Troponins were normal, code STEMI canceled. -He was found to have elevated LFTs bilirubin and lipase of 3987 -Right upper quadrant ultrasound noted gallbladder sludge, gallbladder wall thickening and positive Murphys sign  -Underwent lap chole 11/10, surgery was complicated, noted to have significant inflammation, purulent drainage and bleeding, IOC not completed, drain placed -Postop 10/10 with persistent hypotension finally responded to 3 L of IV fluids and 2 units of blood -stable since  Assessment & Plan:   Acute biliary pancreatitis:  Post op Shock/hypotension -Admitted with abdominal pain nausea and vomiting associated with elevated lipase LFTs and bilirubin  - Ultrasound showed acute cholecystitis with no biliary dilation, General surgery following, underwent lap chole without IOC 44/81, complicated by severe postop hypotension likely from SIRS and blood loss -Given 3 L of IV fluids and 2 units of PRBC, stable and improving now, discontinue IV fluids -Hemoglobin down to 7.3 will recheck this afternoon -Diet per CCS -Continue drain, at risk of bile leak, abscesses -Out of bed to chair, ambulate, PT  eval  Paroxysmal A. fib: Currently in normal sinus rhythm.CHADS-VASc at least 25 (age x2, DM)  on amiodarone and metoprolol.   -Eliquis on hold, would avoid anticoagulation at least for a few days in the setting of complicated surgery   Diabetes type 2: Hemoglobin A1c was 9.9 in October 2021.  -CBG stable, on low-dose Lantus  DVT prophylaxis: SCDs Code Status: Full Family Communication: Wife at bedside Status is: Inpatient  Remains inpatient appropriate because:Inpatient level of care appropriate due to severity of illness   Dispo: The patient is from: Home              Anticipated d/c is to: Home              Anticipated d/c date is: Likely 2 to 3 days              Patient currently is not medically stable to d/c.  Consultants: General surgery  Procedures: Lap chole with drain 11/10  Antimicrobials:  Anti-infectives (From admission, onward)   Start     Dose/Rate Route Frequency Ordered Stop   01/02/20 0345  piperacillin-tazobactam (ZOSYN) IVPB 3.375 g        3.375 g 12.5 mL/hr over 240 Minutes Intravenous Every 8 hours 01/02/20 0343        Subjective: -Upset about not getting enough pain medicines last night, does not recall being hypotensive or sick  Objective: Vitals:   01/06/20 0055 01/06/20 0100 01/06/20 0400 01/06/20 1100  BP:  132/63 (!) 150/85 137/77  Pulse:  76 71   Resp:  13 16   Temp:   97.7 F (36.5 C)   TempSrc:      SpO2:  97% 100%   Weight: 90.7 kg  Height:        Intake/Output Summary (Last 24 hours) at 01/06/2020 1314 Last data filed at 01/06/2020 0745 Gross per 24 hour  Intake 2219.07 ml  Output 710 ml  Net 1509.07 ml   Filed Weights   01/04/20 1030 01/05/20 0238 01/06/20 0055  Weight: 82.6 kg 87.8 kg 90.7 kg    Examination:  Gen: Middle-aged male sitting up in bed, AAOx3, no distress HEENT: Neck supple, no JVD CVS: S1-S2 middle-aged male sitting up in bed, awake alert oriented x3, no distress HEENT: Neck supple, no JVD CVS:  S1-S2, regular rhythm, Lungs: Decreased breath sounds both bases Abdomen: Firm, distended, right upper quadrant drain noted with bleeding and soaked dressing, drain with bloody output Extremities: No edema  Skin: no new rashes    Data Reviewed: I have personally reviewed following labs and imaging studies  CBC: Recent Labs  Lab 01/04/20 1443 01/04/20 1928 01/05/20 0218 01/05/20 0944 01/06/20 0356  WBC 19.6* 16.7* 10.6* 13.8* 11.6*  HGB 8.0* 8.7* 8.9* 8.6* 7.3*  HCT 24.9* 27.5* 25.9* 25.1* 21.5*  MCV 100.8* 101.5* 95.2 95.4 96.4  PLT 166 137* 116* 138* 035*   Basic Metabolic Panel: Recent Labs  Lab 01/02/20 0500 01/03/20 0226 01/04/20 0131 01/05/20 0218 01/06/20 0356  NA 134* 135 133* 135 138  K 4.5 3.7 3.5 3.9 3.4*  CL 102 102 103 108 108  CO2 20* 23 21* 18* 20*  GLUCOSE 232* 124* 118* 226* 157*  BUN 13 11 9 16 14   CREATININE 0.88 0.81 0.66 0.92 0.87  CALCIUM 8.4* 7.9* 7.7* 7.1* 7.6*   GFR: Estimated Creatinine Clearance: 76.3 mL/min (by C-G formula based on SCr of 0.87 mg/dL). Liver Function Tests: Recent Labs  Lab 01/02/20 0500 01/03/20 0226 01/04/20 0131 01/05/20 0218 01/06/20 0356  AST 183* 76* 33 21 20  ALT 159* 102* 63* 38 31  ALKPHOS 76 79 61 44 43  BILITOT 4.0* 1.6* 1.4* 1.8* 0.9  PROT 6.7 6.0* 5.6* 4.8* 5.1*  ALBUMIN 3.4* 3.0* 2.7* 2.3* 2.4*   Recent Labs  Lab 01/01/20 2347 01/02/20 0500 01/03/20 0226 01/04/20 0131 01/05/20 0218  LIPASE 3,987* 1,719* 663* 119* 33   No results for input(s): AMMONIA in the last 168 hours. Coagulation Profile: No results for input(s): INR, PROTIME in the last 168 hours. Cardiac Enzymes: No results for input(s): CKTOTAL, CKMB, CKMBINDEX, TROPONINI in the last 168 hours. BNP (last 3 results) No results for input(s): PROBNP in the last 8760 hours. HbA1C: No results for input(s): HGBA1C in the last 72 hours. CBG: Recent Labs  Lab 01/05/20 2104 01/06/20 0053 01/06/20 0427 01/06/20 0747 01/06/20 1201   GLUCAP 167* 150* 153* 153* 113*   Lipid Profile: No results for input(s): CHOL, HDL, LDLCALC, TRIG, CHOLHDL, LDLDIRECT in the last 72 hours. Thyroid Function Tests: No results for input(s): TSH, T4TOTAL, FREET4, T3FREE, THYROIDAB in the last 72 hours. Anemia Panel: No results for input(s): VITAMINB12, FOLATE, FERRITIN, TIBC, IRON, RETICCTPCT in the last 72 hours. Sepsis Labs: Recent Labs  Lab 01/04/20 2004 01/04/20 2228  LATICACIDVEN 6.7* 3.9*    Recent Results (from the past 240 hour(s))  Respiratory Panel by RT PCR (Flu A&B, Covid) - Nasopharyngeal Swab     Status: None   Collection Time: 01/02/20  3:28 AM   Specimen: Nasopharyngeal Swab  Result Value Ref Range Status   SARS Coronavirus 2 by RT PCR NEGATIVE NEGATIVE Final    Comment: (NOTE) SARS-CoV-2 target nucleic acids are NOT DETECTED.  The SARS-CoV-2  RNA is generally detectable in upper respiratoy specimens during the acute phase of infection. The lowest concentration of SARS-CoV-2 viral copies this assay can detect is 131 copies/mL. A negative result does not preclude SARS-Cov-2 infection and should not be used as the sole basis for treatment or other patient management decisions. A negative result may occur with  improper specimen collection/handling, submission of specimen other than nasopharyngeal swab, presence of viral mutation(s) within the areas targeted by this assay, and inadequate number of viral copies (<131 copies/mL). A negative result must be combined with clinical observations, patient history, and epidemiological information. The expected result is Negative.  Fact Sheet for Patients:  PinkCheek.be  Fact Sheet for Healthcare Providers:  GravelBags.it  This test is no t yet approved or cleared by the [place] FDA and  has been authorized for detection and/or diagnosis of SARS-CoV-2 by FDA under an Emergency Use Authorization (EUA). This  EUA will remain  in effect (meaning this test can be used) for the duration of the COVID-19 declaration under Section 564(b)(1) of the Act, 21 U.S.C. section 360bbb-3(b)(1), unless the authorization is terminated or revoked sooner.     Influenza A by PCR NEGATIVE NEGATIVE Final   Influenza B by PCR NEGATIVE NEGATIVE Final    Comment: (NOTE) The Xpert Xpress SARS-CoV-2/FLU/RSV assay is intended as an aid in  the diagnosis of influenza from Nasopharyngeal swab specimens and  should not be used as a sole basis for treatment. Nasal washings and  aspirates are unacceptable for Xpert Xpress SARS-CoV-2/FLU/RSV  testing.  Fact Sheet for Patients: PinkCheek.be  Fact Sheet for Healthcare Providers: GravelBags.it  This test is not yet approved or cleared by the [place] FDA and  has been authorized for detection and/or diagnosis of SARS-CoV-2 by  FDA under an Emergency Use Authorization (EUA). This EUA will remain  in effect (meaning this test can be used) for the duration of the  Covid-19 declaration under Section 564(b)(1) of the Act, 21  U.S.C. section 360bbb-3(b)(1), unless the authorization is  terminated or revoked. Performed at [facility], [place] [address], [place], [place] [address]   Culture, blood (routine x 2)     Status: None (Preliminary result)   Collection Time: 01/02/20  9:25 AM   Specimen: BLOOD  Result Value Ref Range Status   Specimen Description BLOOD SITE NOT SPECIFIED  Final   Special Requests   Final    BOTTLES DRAWN AEROBIC AND ANAEROBIC Blood Culture results may not be optimal due to an inadequate volume of blood received in culture bottles   Culture   Final    NO GROWTH 4 DAYS Performed at [facility], [place] [address], [place], [place] [address]    Report Status PENDING  Incomplete  Culture, blood (routine x 2)     Status: None (Preliminary result)   Collection Time: 01/02/20  9:39 AM    Specimen: BLOOD RIGHT HAND  Result Value Ref Range Status   Specimen Description BLOOD RIGHT HAND  Final   Special Requests   Final    BOTTLES DRAWN AEROBIC AND ANAEROBIC Blood Culture adequate volume   Culture   Final    NO GROWTH 4 DAYS Performed at [facility], [place] [address] [place],  [address]    Report Status PENDING  Incomplete  Surgical pcr screen     Status: Abnormal   Collection Time: 01/04/20 10:03 AM   Specimen: Nasal Mucosa; Nasal Swab  Result Value Ref  Range Status   MRSA, PCR NEGATIVE NEGATIVE Final   Staphylococcus aureus POSITIVE (A) NEGATIVE Final    Comment: (NOTE) The Xpert SA Assay (FDA approved for NASAL specimens in patients 41 years of age and older), is one component of a comprehensive surveillance program. It is not intended to diagnose infection nor to guide or monitor treatment. Performed at [facility], [place] [address], [place], [place] [address]      Scheduled Meds:  sodium chloride   Intravenous Once   amiodarone  200 mg Oral Daily   insulin aspart  0-9 Units Subcutaneous Q4H   insulin glargine  10 Units Subcutaneous Daily   metoprolol succinate  25 mg Oral Daily   mupirocin ointment   Nasal BID   sodium chloride flush  3 mL Intravenous Q12H   Continuous Infusions:  famotidine (PEPCID) IV 20 mg (01/06/20 0911)   piperacillin-tazobactam (ZOSYN)  IV 3.375 g (01/06/20 0745)     LOS: 4 days    Time spent: 88min   Domenic Polite, MD [facility] P11/01/2020, 1:14 PM

## 2020-01-06 NOTE — Evaluation (Signed)
Physical Therapy Evaluation Patient Details Name: Andrew Humphrey MRN: [id number] DOB: January 17, 1941 Today's Date: 01/06/2020   History of Present Illness  Andrew Humphrey is a 79 y.o. male with medical history significant for atrial fibrillation on Eliquis, type 2 diabetes mellitus, hypertension, and recent admission with Klebsiella bacteremia suspected secondary to cholecystitis, now presenting to the emergency department with acute upper abdominal pain, nausea, and an episode of vomiting.  Clinical Impression  Patient received in bed, significant other present. He is agreeable to PT session. Patient requires cues for safety and to slow pace with walking. He requires min guard for sit to stand. Ambulated 200 feet with IV pole and min guard. Patient gets quite SOB with ambulation, however O2 saturations remained in the mid 90%s, HR in 110s. He reports he gets sob at baseline. Toward end of walk patient seemed to get fatigued and IV pole almost getting away from him. He required cues to stop for a minute. Decreased safety awareness with mobility.  Patient will continue to benefit from skilled PT to improve functional independence and safety with mobility.     Follow Up Recommendations Home health PT;Supervision for mobility/OOB    Equipment Recommendations  None recommended by PT    Recommendations for Other Services       Precautions / Restrictions Precautions Precautions: Fall Precaution Comments: mod fall Restrictions Weight Bearing Restrictions: No      Mobility  Bed Mobility Overal bed mobility: Modified Independent                  Transfers Overall transfer level: Needs assistance   Transfers: Sit to/from Stand              Ambulation/Gait Ambulation/Gait assistance: Min guard Gait Distance (Feet): 200 Feet Assistive device: IV Pole Gait Pattern/deviations: Step-through pattern;Trunk flexed;Drifts right/left Gait velocity: quick pace   General Gait  Details: quick pace, requires cues to slow down. Patient is SOB with mobility but O2 saturations in mid 90%s throughout session.  Stairs            Wheelchair Mobility    Modified Rankin (Stroke Patients Only)       Balance Overall balance assessment: Needs assistance Sitting-balance support: Feet supported Sitting balance-Leahy Scale: Good     Standing balance support: Bilateral upper extremity supported;During functional activity Standing balance-Leahy Scale: Fair Standing balance comment: ambulates at quick pace and requires min guard/assist for steadying at times.                             Pertinent Vitals/Pain Pain Assessment: No/denies pain    Home Living Family/patient expects to be discharged to:: Private residence Living Arrangements: Alone Available Help at Discharge: Family;Friend(s);Available PRN/intermittently Type of Home: House Home Access: Stairs to enter Entrance Stairs-Rails: Right Entrance Stairs-Number of Steps: 2 Home Layout: One level Home Equipment: Cane - single point;Crutches      Prior Function Level of Independence: Independent         Comments: Hydrographic surveyor, drives     Journalist, newspaper        Extremity/Trunk Assessment   Upper Extremity Assessment Upper Extremity Assessment: Overall WFL for tasks assessed    Lower Extremity Assessment Lower Extremity Assessment: Overall WFL for tasks assessed    Cervical / Trunk Assessment Cervical / Trunk Assessment: Normal  Communication   Communication: No difficulties  Cognition Arousal/Alertness: Awake/alert Behavior During Therapy: WFL for tasks assessed/performed Overall Cognitive Status:  Within Functional Limits for tasks assessed                                 General Comments: patient very pleasant, joking throughout session.      General Comments      Exercises     Assessment/Plan    PT Assessment Patient needs continued PT  services  PT Problem List Decreased strength;Decreased mobility;Decreased activity tolerance;Decreased balance;Decreased safety awareness;Cardiopulmonary status limiting activity;Decreased knowledge of precautions       PT Treatment Interventions DME instruction;Therapeutic activities;Gait training;Therapeutic exercise;Patient/family education;Stair training;Balance training;Functional mobility training    PT Goals (Current goals can be found in the Care Plan section)  Acute Rehab PT Goals Patient Stated Goal: to return home when able PT Goal Formulation: With patient Time For Goal Achievement: 01/13/20 Potential to Achieve Goals: Good    Frequency Min 3X/week   Barriers to discharge Decreased caregiver support      Co-evaluation               AM-PAC PT "6 Clicks" Mobility  Outcome Measure Help needed turning from your back to your side while in a flat bed without using bedrails?: None Help needed moving from lying on your back to sitting on the side of a flat bed without using bedrails?: None Help needed moving to and from a bed to a chair (including a wheelchair)?: A Little Help needed standing up from a chair using your arms (e.g., wheelchair or bedside chair)?: A Little Help needed to walk in hospital room?: A Little Help needed climbing 3-5 steps with a railing? : A Lot 6 Click Score: 19    End of Session Equipment Utilized During Treatment: Gait belt Activity Tolerance: Patient tolerated treatment well Patient left: in chair;with call bell/phone within reach;with family/visitor present Nurse Communication: Mobility status PT Visit Diagnosis: Unsteadiness on feet (R26.81);Muscle weakness (generalized) (M62.81);Difficulty in walking, not elsewhere classified (R26.2)    Time: 0488-8916 PT Time Calculation (min) (ACUTE ONLY): 20 min   Charges:   PT Evaluation $PT Eval Moderate Complexity: 1 Mod PT Treatments $Gait Training: 8-22 mins        Nitya Cauthon,  PT, GCS 01/06/20,2:12 PM

## 2020-01-06 NOTE — Progress Notes (Addendum)
Dressing changed this am. Ice pack placed over dressing. Patient denies pain currently.   Diet upgraded to soft diet.

## 2020-01-06 NOTE — Progress Notes (Signed)
2 Days Post-Op  Subjective: CC: Patient reports some epigastric abdominal pain yesterday that was controlled with medications. No abdominal pain this am. Tolerating cld without n/v. Notes that he has gotten to the edge of bed but has not ambulated. He is voiding without difficulty.   Objective: Vital signs in last 24 hours: Temp:  [97.6 F (36.4 C)-97.8 F (36.6 C)] 97.7 F (36.5 C) (11/12 0400) Pulse Rate:  [71-76] 71 (11/12 0400) Resp:  [13-16] 16 (11/12 0400) BP: (123-170)/(63-87) 150/85 (11/12 0400) SpO2:  [97 %-100 %] 100 % (11/12 0400) Weight:  [90.7 kg] 90.7 kg (11/12 0055) Last BM Date: 01/04/20  Intake/Output from previous day: 11/11 0701 - 11/12 0700 In: 2808.3 [P.O.:1840; I.V.:868.3; IV Piggyback:100] Out: 1070 [Urine:900; Drains:170] Intake/Output this shift: Total I/O In: 210.7 [I.V.:160.7; IV Piggyback:50] Out: 20 [Drains:20]  PE: Gen:  Alert, NAD, pleasant Card:  Reg Pulm:  rate and effort normal Abd: Soft, mild distension, NT, + BS , lap incisions cdi, JP drain with bloody/ serosanguinous fluid in bulb Psych: A&Ox4    Lab Results:  Recent Labs    01/05/20 0944 01/06/20 0356  WBC 13.8* 11.6*  HGB 8.6* 7.3*  HCT 25.1* 21.5*  PLT 138* 123*   BMET Recent Labs    01/05/20 0218 01/06/20 0356  NA 135 138  K 3.9 3.4*  CL 108 108  CO2 18* 20*  GLUCOSE 226* 157*  BUN 16 14  CREATININE 0.92 0.87  CALCIUM 7.1* 7.6*   PT/INR No results for input(s): LABPROT, INR in the last 72 hours. CMP     Component Value Date/Time   NA 138 01/06/2020 0356   K 3.4 (L) 01/06/2020 0356   CL 108 01/06/2020 0356   CO2 20 (L) 01/06/2020 0356   GLUCOSE 157 (H) 01/06/2020 0356   BUN 14 01/06/2020 0356   CREATININE 0.87 01/06/2020 0356   CALCIUM 7.6 (L) 01/06/2020 0356   PROT 5.1 (L) 01/06/2020 0356   ALBUMIN 2.4 (L) 01/06/2020 0356   AST 20 01/06/2020 0356   ALT 31 01/06/2020 0356   ALKPHOS 43 01/06/2020 0356   BILITOT 0.9 01/06/2020 0356   GFRNONAA  >60 01/06/2020 0356   GFRAA >60 06/19/2017 1633   Lipase     Component Value Date/Time   LIPASE 33 01/05/2020 0218       Studies/Results: DG Abd Portable 1V  Result Date: 01/04/2020 CLINICAL DATA:  Lower abdominal pain EXAM: PORTABLE ABDOMEN - 1 VIEW COMPARISON:  None. FINDINGS: No dilated large or small bowel. There is contrast within the colon which is nondistended. Gas in the rectum. IMPRESSION: No evidence of bowel obstruction. Electronically Signed   By: Suzy Bouchard M.D.   On: 01/04/2020 20:34    Anti-infectives: Anti-infectives (From admission, onward)   Start     Dose/Rate Route Frequency Ordered Stop   01/02/20 0345  piperacillin-tazobactam (ZOSYN) IVPB 3.375 g        3.375 g 12.5 mL/hr over 240 Minutes Intravenous Every 8 hours 01/02/20 0343         Assessment/Plan HTN HLD DM A fib - eliquis on hold ABL anemia - s/p 2 units PRBCs 11/10 , VSS, hgb 7.3 this am form 8.6  Biliary pancreatitis, cholecystitis S/p laparoscopic cholecystectomy with drain placement 11/10 Dr. Ninfa Linden - POD#2 - unable to perform IOC, LFT's normalized - Lipase WNL - Continue drain as he is at risk for post op bile leak - Mobilize, PT - Pulm toilet. - Adv diet  FEN - FLD, AAT to soft  VTE -SCDs only for now, continue to hold heparin gttand eliquis until hgb stabilizes  ID -zosyn11/8>> Foley - none Follow up - TBD   LOS: 4 days    Jillyn Ledger , [facility] 01/06/2020, 8:35 AM Please see Amion for pager number during day hours 7:00am-4:30pm

## 2020-01-06 NOTE — TOC Transition Note (Signed)
Transition of Care [facility]) - CM/SW Discharge Note   Patient Details  Name: Andrew Humphrey MRN: [id number] Date of Birth: 1940/11/08  [facility] CM/SW Contact:  Zenon Mayo, RN Phone Number: 01/06/2020, 4:44 PM   Clinical Narrative:    NCM spoke with patient and wife at bedside, offered choice for HHPT, he states he does not want HHPT.  He goes to the [facility].  He states he has a walker and a cane but does not have to use it much.     Final next level of care: Home/Self Care Barriers to Discharge: Continued Medical Work up   Patient Goals and CMS Choice Patient states their goals for this hospitalization and ongoing recovery are:: go home CMS Medicare.gov Compare Post Acute Care list provided to:: Patient Choice offered to / list presented to : Patient  Discharge Placement                       Discharge Plan and Services                  DME Agency: NA       HH Arranged: PT, Refused HH          Social Determinants of Health (SDOH) Interventions     Readmission Risk Interventions No flowsheet data found.

## 2020-01-07 DIAGNOSIS — K810 Acute cholecystitis: Secondary | ICD-10-CM | POA: Diagnosis not present

## 2020-01-07 LAB — RETICULOCYTES
Immature Retic Fract: 22.3 % — ABNORMAL HIGH (ref 2.3–15.9)
RBC.: 2.29 MIL/uL — ABNORMAL LOW (ref 4.22–5.81)
Retic Count, Absolute: 66.6 10*3/uL (ref 19.0–186.0)
Retic Ct Pct: 2.9 % (ref 0.4–3.1)

## 2020-01-07 LAB — FOLATE: Folate: 9.8 ng/mL (ref >5.9)

## 2020-01-07 LAB — CULTURE, BLOOD (ROUTINE X 2)
Culture: NO GROWTH
Culture: NO GROWTH
Special Requests: ADEQUATE

## 2020-01-07 LAB — BASIC METABOLIC PANEL
Anion gap: 6 (ref 5–15)
BUN: 10 mg/dL (ref 8–23)
CO2: 22 mmol/L (ref 22–32)
Calcium: 7.5 mg/dL — ABNORMAL LOW (ref 8.9–10.3)
Chloride: 107 mmol/L (ref 98–111)
Creatinine, Ser: 0.77 mg/dL (ref 0.61–1.24)
GFR, Estimated: >60 mL/min (ref >60)
Glucose, Bld: 150 mg/dL — ABNORMAL HIGH (ref 70–99)
Potassium: 3 mmol/L — ABNORMAL LOW (ref 3.5–5.1)
Sodium: 135 mmol/L (ref 135–145)

## 2020-01-07 LAB — IRON AND TIBC
Iron: 33 ug/dL — ABNORMAL LOW (ref 45–182)
Saturation Ratios: 16 % — ABNORMAL LOW (ref 17.9–39.5)
TIBC: 211 ug/dL — ABNORMAL LOW (ref 250–450)
UIBC: 178 ug/dL

## 2020-01-07 LAB — CBC
HCT: 21.7 % — ABNORMAL LOW (ref 39.0–52.0)
Hemoglobin: 7.4 g/dL — ABNORMAL LOW (ref 13.0–17.0)
MCH: 32.6 pg (ref 26.0–34.0)
MCHC: 34.1 g/dL (ref 30.0–36.0)
MCV: 95.6 fL (ref 80.0–100.0)
Platelets: 136 10*3/uL — ABNORMAL LOW (ref 150–400)
RBC: 2.27 MIL/uL — ABNORMAL LOW (ref 4.22–5.81)
RDW: 18.6 % — ABNORMAL HIGH (ref 11.5–15.5)
WBC: 9.7 10*3/uL (ref 4.0–10.5)
nRBC: 0.2 % (ref 0.0–0.2)

## 2020-01-07 LAB — VITAMIN B12: Vitamin B-12: 200 pg/mL (ref 180–914)

## 2020-01-07 LAB — MAGNESIUM: Magnesium: 1.8 mg/dL (ref 1.7–2.4)

## 2020-01-07 LAB — GLUCOSE, CAPILLARY
Glucose-Capillary: 186 mg/dL — ABNORMAL HIGH (ref 70–99)
Glucose-Capillary: 155 mg/dL — ABNORMAL HIGH (ref 70–99)
Glucose-Capillary: 147 mg/dL — ABNORMAL HIGH (ref 70–99)
Glucose-Capillary: 129 mg/dL — ABNORMAL HIGH (ref 70–99)

## 2020-01-07 LAB — HEMOGLOBIN AND HEMATOCRIT, BLOOD
HCT: 24.6 % — ABNORMAL LOW (ref 39.0–52.0)
Hemoglobin: 8.5 g/dL — ABNORMAL LOW (ref 13.0–17.0)

## 2020-01-07 LAB — PREPARE RBC (CROSSMATCH)

## 2020-01-07 LAB — FERRITIN: Ferritin: 497 ng/mL — ABNORMAL HIGH (ref 24–336)

## 2020-01-07 MED ORDER — AMOXICILLIN-POT CLAVULANATE 875-125 MG PO TABS
1.0000 | ORAL_TABLET | Freq: Two times a day (BID) | ORAL | Status: DC
  Administered 2020-01-07 – 2020-01-08 (×3): 1 via ORAL
  Filled 2020-01-07 (×3): qty 1

## 2020-01-07 MED ORDER — SODIUM CHLORIDE 0.9 % IV SOLN
INTRAVENOUS | Status: DC | PRN
  Administered 2020-01-07: 250 mL via INTRAVENOUS
  Administered 2020-01-07: 1000 mL via INTRAVENOUS

## 2020-01-07 MED ORDER — SODIUM CHLORIDE 0.9 % IV SOLN
510.0000 mg | Freq: Once | INTRAVENOUS | Status: AC
  Administered 2020-01-07: 510 mg via INTRAVENOUS
  Filled 2020-01-07: qty 17

## 2020-01-07 MED ORDER — SODIUM CHLORIDE 0.9% IV SOLUTION
Freq: Once | INTRAVENOUS | Status: AC
  Administered 2020-01-07: 10 mL/h via INTRAVENOUS

## 2020-01-07 MED ORDER — POTASSIUM CHLORIDE CRYS ER 20 MEQ PO TBCR
40.0000 meq | EXTENDED_RELEASE_TABLET | Freq: Once | ORAL | Status: AC
  Administered 2020-01-07: 40 meq via ORAL
  Filled 2020-01-07: qty 2

## 2020-01-07 NOTE — Progress Notes (Signed)
   Progress Note  3 Days Post-Op  Subjective: Patient wants to go home. Denies abdominal pain or nausea. Tolerating diet.  Actively being transfused.   Objective: Vital signs in last 24 hours: Temp:  [97.8 F (36.6 C)-98.6 F (37 C)] 97.9 F (36.6 C) (11/13 1100) Pulse Rate:  [68-80] 75 (11/13 1026) Resp:  [14-18] 18 (11/13 1026) BP: (124-147)/(64-82) 138/78 (11/13 1100) SpO2:  [95 %-99 %] 98 % (11/13 1026) Weight:  [89.5 kg] 89.5 kg (11/13 0549) Last BM Date: 01/04/20  Intake/Output from previous day: 11/12 0701 - 11/13 0700 In: 831.8 [P.O.:320; I.V.:163.7; IV Piggyback:348] Out: 0865 [Urine:975; Drains:130; Stool:700] Intake/Output this shift: Total I/O In: 420 [P.O.:400; I.V.:20] Out: 40 [Drains:40]  PE: Gen: Alert, NAD, pleasant Card: Reg Pulm: rate and effort normal Abd: Soft,mild distension,NT, +BS,lap incisions cdi, JP drain with bloody fluid Psych: A&Ox4    Lab Results:  Recent Labs    01/06/20 1443 01/07/20 0443  WBC 12.3* 9.7  HGB 7.6* 7.4*  HCT 22.6* 21.7*  PLT 132* 136*   BMET Recent Labs    01/06/20 0356 01/07/20 0443  NA 138 135  K 3.4* 3.0*  CL 108 107  CO2 20* 22  GLUCOSE 157* 150*  BUN 14 10  CREATININE 0.87 0.77  CALCIUM 7.6* 7.5*   PT/INR No results for input(s): LABPROT, INR in the last 72 hours. CMP     Component Value Date/Time   NA 135 01/07/2020 0443   K 3.0 (L) 01/07/2020 0443   CL 107 01/07/2020 0443   CO2 22 01/07/2020 0443   GLUCOSE 150 (H) 01/07/2020 0443   BUN 10 01/07/2020 0443   CREATININE 0.77 01/07/2020 0443   CALCIUM 7.5 (L) 01/07/2020 0443   PROT 5.1 (L) 01/06/2020 0356   ALBUMIN 2.4 (L) 01/06/2020 0356   AST 20 01/06/2020 0356   ALT 31 01/06/2020 0356   ALKPHOS 43 01/06/2020 0356   BILITOT 0.9 01/06/2020 0356   GFRNONAA >60 01/07/2020 0443   GFRAA >60 06/19/2017 1633   Lipase     Component Value Date/Time   LIPASE 33 01/05/2020 0218       Studies/Results: No results  found.  Anti-infectives: Anti-infectives (From admission, onward)   Start     Dose/Rate Route Frequency Ordered Stop   01/02/20 0345  piperacillin-tazobactam (ZOSYN) IVPB 3.375 g        3.375 g 12.5 mL/hr over 240 Minutes Intravenous Every 8 hours 01/02/20 0343         Assessment/Plan HTN HLD DM A fib - eliquis on hold ABL anemia - s/p 2 units PRBCs 11/10 , VSS, hgb 7.4, getting transfused 1 unit now   Biliary pancreatitis, cholecystitis S/p laparoscopic cholecystectomy with drain placement 11/10 Dr. Ninfa Linden - POD#3 - unable to perform IOC, LFT's normalized - Lipase WNL - drain bloody but not bilious - will discuss possible removal with MD - Mobilize, PT - Pulm toilet.  FEN - soft diet VTE -SCDs only for now, will discuss eliquis with MD ID -[id number]>11/13, ok to transition to PO augmentin  Foley - none Follow up - TBD  LOS: 5 days    Norm Parcel , [facility] 01/07/2020, 12:04 PM Please see Amion for pager number during day hours 7:00am-4:30pm

## 2020-01-07 NOTE — Progress Notes (Signed)
PROGRESS NOTE    LEANDER TOUT  IWL:[id number] DOB: 16-Oct-1940 DOA: 01/01/2020 PCP: Lanelle Bal, PA-C   Chief Complain: Abdominal pain, nausea, vomiting  Brief Narrative: Patient is a [name] with history of atrial fibrillation on Eliquis, diabetes mellitus type 2, hypertension, recent history of Klebsiella bacteremia secondary to cholecystitis who presented to the emergency department with upper abdominal pain, nausea, vomiting.  He was [facility] for [facility] complaint, found to have cholecystitis and underwent conservative management and was discharged home.  He was evaluated at outside hospital where EKG was concerning for STEMI and was sent to the emergency department for cardiology evaluation.  EKG features resolved at the time he presented to [facility].  Troponins were normal, code STEMI canceled. -He was found to have elevated LFTs bilirubin and lipase of 3987 -Right upper quadrant ultrasound noted gallbladder sludge, gallbladder wall thickening and positive Murphys sign  -Underwent lap chole 11/10, surgery was complicated, noted to have significant inflammation, purulent drainage and bleeding, IOC not completed, drain placed -Postop 10/10 with persistent hypotension finally responded to 3 L of IV fluids and 2 units of blood  Assessment & Plan:   Acute biliary pancreatitis:  Post op Shock/hypotension -Admitted with abdominal pain nausea and vomiting associated with elevated lipase LFTs and bilirubin  - Ultrasound showed acute cholecystitis with no biliary dilation, General surgery following, underwent lap chole without IOC 17/40, complicated by severe postop hypotension likely from SIRS and blood loss, given 3 L of IV fluids and 2 units of PRBC, stable and improving now, off IVF -Hemoglobin 7.4 this morning will transfuse a unit of PRBC -Eliquis is on hold, likely resume this in 4 to 5 days -Ambulate -Labs in a.m., home tomorrow if stable  Acute blood loss  anemia -Transfused 2 units of PRBC postop, hemoglobin 7.4 this morning we will transfuse another unit of blood -Check anemia panel, give IV iron if needed -Monitor hemoglobin  Paroxysmal A. fib: Currently in normal sinus rhythm.CHADS-VASc at least 31 (age x2, DM)  on amiodarone and metoprolol.   -Eliquis on hold, would avoid anticoagulation at least for 4-5 more days in the setting of complicated surgery   Diabetes type 2: Hemoglobin A1c was 9.9 in October 2021.  -CBG stable, on low-dose Lantus  DVT prophylaxis: SCDs Code Status: Full Family Communication: Wife at bedside Status is: Inpatient  Remains inpatient appropriate because:Inpatient level of care appropriate due to severity of illness   Dispo: The patient is from: Home              Anticipated d/c is to: Home              Anticipated d/c date is: Likely tomorrow 11/14 if stable              Patient currently is not medically stable to d/c.  Consultants: General surgery  Procedures: Lap chole with drain 11/10  Antimicrobials:  Anti-infectives (From admission, onward)   Start     Dose/Rate Route Frequency Ordered Stop   01/02/20 0345  piperacillin-tazobactam (ZOSYN) IVPB 3.375 g        3.375 g 12.5 mL/hr over 240 Minutes Intravenous Every 8 hours 01/02/20 0343        Subjective: -Feels well, denies any complaints, ambulated a bit with PT yesterday  Objective: Vitals:   01/07/20 0700 01/07/20 0800 01/07/20 1026 01/07/20 1100  BP: 129/70 138/68 (!) 142/75 138/78  Pulse: 68 77 75   Resp: 14 17 18  Temp:  98.6 F (37 C) 98.2 F (36.8 C) 97.9 F (36.6 C)  TempSrc:  Oral Oral Oral  SpO2: 97% 97% 98%   Weight:      Height:        Intake/Output Summary (Last 24 hours) at 01/07/2020 1112 Last data filed at 01/07/2020 1055 Gross per 24 hour  Intake 1041.04 ml  Output 1325 ml  Net -283.96 ml   Filed Weights   01/05/20 0238 01/06/20 0055 01/07/20 0549  Weight: 87.8 kg 90.7 kg 89.5 kg     Examination:  Gen: Middle-aged male sitting up in bed, AAOx3, no distress HEENT: Neck supple no JVD CVS: S1-S2, regular rate rhythm Lungs: Decreased breath sounds the bases Abdomen: Soft, mildly distended, right upper abdomen drain, bloody output in the drain  Extremities: No edema  Skin: no new rashes    Data Reviewed: I have personally reviewed following labs and imaging studies  CBC: Recent Labs  Lab 01/05/20 0218 01/05/20 0944 01/06/20 0356 01/06/20 1443 01/07/20 0443  WBC 10.6* 13.8* 11.6* 12.3* 9.7  HGB 8.9* 8.6* 7.3* 7.6* 7.4*  HCT 25.9* 25.1* 21.5* 22.6* 21.7*  MCV 95.2 95.4 96.4 96.6 95.6  PLT 116* 138* 123* 132* 379*   Basic Metabolic Panel: Recent Labs  Lab 01/03/20 0226 01/04/20 0131 01/05/20 0218 01/06/20 0356 01/07/20 0443 01/07/20 0808  NA 135 133* 135 138 135  --   K 3.7 3.5 3.9 3.4* 3.0*  --   CL 102 103 108 108 107  --   CO2 23 21* 18* 20* 22  --   GLUCOSE 124* 118* 226* 157* 150*  --   BUN 11 9 16 14 10   --   CREATININE 0.81 0.66 0.92 0.87 0.77  --   CALCIUM 7.9* 7.7* 7.1* 7.6* 7.5*  --   MG  --   --   --   --   --  1.8   GFR: Estimated Creatinine Clearance: 82.6 mL/min (by C-G formula based on SCr of 0.77 mg/dL). Liver Function Tests: Recent Labs  Lab 01/02/20 0500 01/03/20 0226 01/04/20 0131 01/05/20 0218 01/06/20 0356  AST 183* 76* 33 21 20  ALT 159* 102* 63* 38 31  ALKPHOS 76 79 61 44 43  BILITOT 4.0* 1.6* 1.4* 1.8* 0.9  PROT 6.7 6.0* 5.6* 4.8* 5.1*  ALBUMIN 3.4* 3.0* 2.7* 2.3* 2.4*   Recent Labs  Lab 01/01/20 2347 01/02/20 0500 01/03/20 0226 01/04/20 0131 01/05/20 0218  LIPASE 3,987* 1,719* 663* 119* 33   No results for input(s): AMMONIA in the last 168 hours. Coagulation Profile: No results for input(s): INR, PROTIME in the last 168 hours. Cardiac Enzymes: No results for input(s): CKTOTAL, CKMB, CKMBINDEX, TROPONINI in the last 168 hours. BNP (last 3 results) No results for input(s): PROBNP in the last 8760  hours. HbA1C: No results for input(s): HGBA1C in the last 72 hours. CBG: Recent Labs  Lab 01/06/20 0747 01/06/20 1201 01/06/20 1743 01/06/20 2002 01/07/20 0826  GLUCAP 153* 113* 157* 142* 186*   Lipid Profile: No results for input(s): CHOL, HDL, LDLCALC, TRIG, CHOLHDL, LDLDIRECT in the last 72 hours. Thyroid Function Tests: No results for input(s): TSH, T4TOTAL, FREET4, T3FREE, THYROIDAB in the last 72 hours. Anemia Panel: Recent Labs    01/07/20 0443 01/07/20 0832  VITAMINB12  --  200  FOLATE  --  9.8  FERRITIN  --  497*  TIBC  --  211*  IRON  --  33*  RETICCTPCT 2.9  --  Sepsis Labs: Recent Labs  Lab 01/04/20 2004 01/04/20 2228  LATICACIDVEN 6.7* 3.9*    Recent Results (from the past 240 hour(s))  Respiratory Panel by RT PCR (Flu A&B, Covid) - Nasopharyngeal Swab     Status: None   Collection Time: 01/02/20  3:28 AM   Specimen: Nasopharyngeal Swab  Result Value Ref Range Status   SARS Coronavirus 2 by RT PCR NEGATIVE NEGATIVE Final    Comment: (NOTE) SARS-CoV-2 target nucleic acids are NOT DETECTED.  The SARS-CoV-2 RNA is generally detectable in upper respiratoy specimens during the acute phase of infection. The lowest concentration of SARS-CoV-2 viral copies this assay can detect is 131 copies/mL. A negative result does not preclude SARS-Cov-2 infection and should not be used as the sole basis for treatment or other patient management decisions. A negative result may occur with  improper specimen collection/handling, submission of specimen other than nasopharyngeal swab, presence of viral mutation(s) within the areas targeted by this assay, and inadequate number of viral copies (<131 copies/mL). A negative result must be combined with clinical observations, patient history, and epidemiological information. The expected result is Negative.  Fact Sheet for Patients:  PinkCheek.be  Fact Sheet for Healthcare Providers:   GravelBags.it  This test is no t yet approved or cleared by the [place] FDA and  has been authorized for detection and/or diagnosis of SARS-CoV-2 by FDA under an Emergency Use Authorization (EUA). This EUA will remain  in effect (meaning this test can be used) for the duration of the COVID-19 declaration under Section 564(b)(1) of the Act, 21 U.S.C. section 360bbb-3(b)(1), unless the authorization is terminated or revoked sooner.     Influenza A by PCR NEGATIVE NEGATIVE Final   Influenza B by PCR NEGATIVE NEGATIVE Final    Comment: (NOTE) The Xpert Xpress SARS-CoV-2/FLU/RSV assay is intended as an aid in  the diagnosis of influenza from Nasopharyngeal swab specimens and  should not be used as a sole basis for treatment. Nasal washings and  aspirates are unacceptable for Xpert Xpress SARS-CoV-2/FLU/RSV  testing.  Fact Sheet for Patients: PinkCheek.be  Fact Sheet for Healthcare Providers: GravelBags.it  This test is not yet approved or cleared by the [place] FDA and  has been authorized for detection and/or diagnosis of SARS-CoV-2 by  FDA under an Emergency Use Authorization (EUA). This EUA will remain  in effect (meaning this test can be used) for the duration of the  Covid-19 declaration under Section 564(b)(1) of the Act, 21  U.S.C. section 360bbb-3(b)(1), unless the authorization is  terminated or revoked. Performed at [facility], [place] [address] [place], [place] [address]   Culture, blood (routine x 2)     Status: None   Collection Time: 01/02/20  9:25 AM   Specimen: BLOOD  Result Value Ref Range Status   Specimen Description BLOOD SITE NOT SPECIFIED  Final   Special Requests   Final    BOTTLES DRAWN AEROBIC AND ANAEROBIC Blood Culture results may not be optimal due to an inadequate volume of blood received in culture bottles   Culture   Final    NO GROWTH 5  DAYS Performed at [facility], [place] [address], [place], [place] [address]    Report Status 01/07/2020 FINAL  Final  Culture, blood (routine x 2)     Status: None   Collection Time: 01/02/20  9:39 AM   Specimen: BLOOD RIGHT HAND  Result Value Ref Range Status   Specimen Description BLOOD RIGHT  HAND  Final   Special Requests   Final    BOTTLES DRAWN AEROBIC AND ANAEROBIC Blood Culture adequate volume   Culture   Final    NO GROWTH 5 DAYS Performed at [facility], [address] [place], [place] [address]    Report Status 01/07/2020 FINAL  Final  Surgical pcr screen     Status: Abnormal   Collection Time: 01/04/20 10:03 AM   Specimen: Nasal Mucosa; Nasal Swab  Result Value Ref Range Status   MRSA, PCR NEGATIVE NEGATIVE Final   Staphylococcus aureus POSITIVE (A) NEGATIVE Final    Comment: (NOTE) The Xpert SA Assay (FDA approved for NASAL specimens in patients 64 years of age and older), is one component of a comprehensive surveillance program. It is not intended to diagnose infection nor to guide or monitor treatment. Performed at [facility], [place] [address] [place], [place] [address]      Scheduled Meds: . sodium chloride   Intravenous Once  . amiodarone  200 mg Oral Daily  . insulin aspart  0-5 Units Subcutaneous QHS  . insulin aspart  0-9 Units Subcutaneous TID WC  . insulin glargine  10 Units Subcutaneous Daily  . melatonin  3 mg Oral QHS  . metoprolol succinate  25 mg Oral Daily  . mupirocin ointment   Nasal BID  . sodium chloride flush  3 mL Intravenous Q12H   Continuous Infusions: . sodium chloride 1,000 mL (01/07/20 0543)  . famotidine (PEPCID) IV 20 mg (01/07/20 0828)  . ferumoxytol    . piperacillin-tazobactam (ZOSYN)  IV 3.375 g (01/07/20 0545)     LOS: 5 days    Time spent: 50min  Domenic Polite, MD [facility] P11/13/2021, 11:12 AM

## 2020-01-07 NOTE — Progress Notes (Signed)
Notified MD about AM Potassium and Calcium results

## 2020-01-07 NOTE — Plan of Care (Signed)
  Problem: Education: Goal: Knowledge of General Education information will improve Description: Including pain rating scale, medication(s)/side effects and non-pharmacologic comfort measures Outcome: Progressing   Problem: Health Behavior/Discharge Planning: Goal: Ability to manage health-related needs will improve Outcome: Progressing   Problem: Pain Managment: Goal: General experience of comfort will improve Outcome: Progressing   

## 2020-01-08 DIAGNOSIS — K810 Acute cholecystitis: Secondary | ICD-10-CM | POA: Diagnosis not present

## 2020-01-08 LAB — TYPE AND SCREEN
ABO/RH(D): A POS
Antibody Screen: NEGATIVE
Unit division: 0
Unit division: 0
Unit division: 0

## 2020-01-08 LAB — BPAM RBC
Blood Product Expiration Date: 202111302359
Blood Product Expiration Date: 202112022359
Blood Product Expiration Date: 202112022359
ISSUE DATE / TIME: 202111101724
ISSUE DATE / TIME: 202111101927
ISSUE DATE / TIME: 202111131037
Unit Type and Rh: 6200
Unit Type and Rh: 6200
Unit Type and Rh: 6200

## 2020-01-08 LAB — CBC
HCT: 24.3 % — ABNORMAL LOW (ref 39.0–52.0)
Hemoglobin: 8.2 g/dL — ABNORMAL LOW (ref 13.0–17.0)
MCH: 31.9 pg (ref 26.0–34.0)
MCHC: 33.7 g/dL (ref 30.0–36.0)
MCV: 94.6 fL (ref 80.0–100.0)
Platelets: 123 10*3/uL — ABNORMAL LOW (ref 150–400)
RBC: 2.57 MIL/uL — ABNORMAL LOW (ref 4.22–5.81)
RDW: 18.6 % — ABNORMAL HIGH (ref 11.5–15.5)
WBC: 8.3 10*3/uL (ref 4.0–10.5)
nRBC: 0.4 % — ABNORMAL HIGH (ref 0.0–0.2)

## 2020-01-08 LAB — GLUCOSE, CAPILLARY
Glucose-Capillary: 134 mg/dL — ABNORMAL HIGH (ref 70–99)
Glucose-Capillary: 131 mg/dL — ABNORMAL HIGH (ref 70–99)
Glucose-Capillary: 125 mg/dL — ABNORMAL HIGH (ref 70–99)

## 2020-01-08 LAB — BASIC METABOLIC PANEL
Anion gap: 7 (ref 5–15)
BUN: 6 mg/dL — ABNORMAL LOW (ref 8–23)
CO2: 23 mmol/L (ref 22–32)
Calcium: 7.8 mg/dL — ABNORMAL LOW (ref 8.9–10.3)
Chloride: 105 mmol/L (ref 98–111)
Creatinine, Ser: 0.73 mg/dL (ref 0.61–1.24)
GFR, Estimated: >60 mL/min (ref >60)
Glucose, Bld: 136 mg/dL — ABNORMAL HIGH (ref 70–99)
Potassium: 3.5 mmol/L (ref 3.5–5.1)
Sodium: 135 mmol/L (ref 135–145)

## 2020-01-08 MED ORDER — ACETAMINOPHEN 325 MG PO TABS: 650.0000 mg | ORAL_TABLET | Freq: Four times a day (QID) | ORAL | Status: DC | PRN

## 2020-01-08 MED ORDER — AMOXICILLIN-POT CLAVULANATE 875-125 MG PO TABS: 1.0000 | ORAL_TABLET | Freq: Two times a day (BID) | ORAL | 0 refills | Status: AC

## 2020-01-08 MED ORDER — APIXABAN 5 MG PO TABS
5.0000 mg | ORAL_TABLET | Freq: Two times a day (BID) | ORAL | Status: DC
Start: 2020-01-13 — End: 2020-11-21

## 2020-01-08 MED ORDER — OXYCODONE HCL 5 MG PO TABS: 5.0000 mg | ORAL_TABLET | Freq: Four times a day (QID) | ORAL | 0 refills | Status: DC | PRN

## 2020-01-08 NOTE — Discharge Instructions (Signed)
[facility]  Please arrive at least 30 min before your appointment to complete your check in paperwork.  If you are unable to arrive 30 min prior to your appointment time we may have to cancel or reschedule you. LAPAROSCOPIC SURGERY: POST OP INSTRUCTIONS Always review your discharge instruction sheet given to you by the facility where your surgery was performed. IF YOU HAVE DISABILITY OR FAMILY LEAVE FORMS, YOU MUST BRING THEM TO THE OFFICE FOR PROCESSING.   DO NOT GIVE THEM TO YOUR DOCTOR.  PAIN CONTROL  1. First take acetaminophen (Tylenol) AND/or ibuprofen (Advil) to control your pain after surgery.  Follow directions on package.  Taking acetaminophen (Tylenol) and/or ibuprofen (Advil) regularly after surgery will help to control your pain and lower the amount of prescription pain medication you may need.  You should not take more than 4,000 mg (4 grams) of acetaminophen (Tylenol) in 24 hours.  You should not take ibuprofen (Advil), aleve, motrin, naprosyn or other NSAIDS if you have a history of stomach ulcers or chronic kidney disease.  2. A prescription for pain medication may be given to you upon discharge.  Take your pain medication as prescribed, if you still have uncontrolled pain after taking acetaminophen (Tylenol) or ibuprofen (Advil). 3. Use ice packs to help control pain. 4. If you need a refill on your pain medication, please contact your pharmacy.  They will contact our office to request authorization. Prescriptions will not be filled after 5pm or on week-ends.  HOME MEDICATIONS 5. Take your usually prescribed medications unless otherwise directed.  DIET 6. You should follow a light diet the first few days after arrival home.  Be sure to include lots of fluids daily. Avoid fatty, fried foods.   CONSTIPATION 7. It is common to experience some constipation after surgery and if you are taking pain medication.  Increasing fluid intake and taking a stool  softener (such as Colace) will usually help or prevent this problem from occurring.  A mild laxative (Milk of Magnesia or Miralax) should be taken according to package instructions if there are no bowel movements after 48 hours.  WOUND/INCISION CARE 8. Most patients will experience some swelling and bruising in the area of the incisions.  Ice packs will help.  Swelling and bruising can take several days to resolve.  9. Unless discharge instructions indicate otherwise, follow guidelines below  a. STERI-STRIPS - you may remove your outer bandages 48 hours after surgery, and you may shower at that time.  You have steri-strips (small skin tapes) in place directly over the incision.  These strips should be left on the skin for 7-10 days.   b. DERMABOND/SKIN GLUE - you may shower in 24 hours.  The glue will flake off over the next 2-3 weeks. 10. Any sutures or staples will be removed at the office during your follow-up visit.  ACTIVITIES 11. You may resume regular (light) daily activities beginning the next day--such as daily self-care, walking, climbing stairs--gradually increasing activities as tolerated.  You may have sexual intercourse when it is comfortable.  Refrain from any heavy lifting or straining until approved by your doctor. a. You may drive when you are no longer taking prescription pain medication, you can comfortably wear a seatbelt, and you can safely maneuver your car and apply brakes.  FOLLOW-UP 12. You should see your doctor in the office for a follow-up appointment approximately 2-3 weeks after your surgery.  You should have been given your post-op/follow-up appointment when   your surgery was scheduled.  If you did not receive a post-op/follow-up appointment, make sure that you call for this appointment within a day or two after you arrive home to insure a convenient appointment time.   WHEN TO CALL YOUR DOCTOR: 1. Fever over 101.0 2. Inability to urinate 3. Continued bleeding from  incision. 4. Increased pain, redness, or drainage from the incision. 5. Increasing abdominal pain  The clinic staff is available to answer your questions during regular business hours.  Please don't hesitate to call and ask to speak to one of the nurses for clinical concerns.  If you have a medical emergency, go to the nearest emergency room or call 911.  A surgeon from [facility] is always on call at the hospital. [address], [place], [place], [place]  [address] ? [address], [place], [place]   [address] [phone] ? [phone] ? FAX [phone]    [place] Surgical drains are used to remove extra fluid that normally builds up in a surgical wound after surgery. A surgical drain helps to heal a surgical wound. Different kinds of surgical drains include:  Active drains. These drains use suction to pull drainage away from the surgical wound. Drainage flows through a tube to a container outside of the body. With these drains, you need to keep the bulb or the drainage container flat (compressed) at all times, except while you empty it. Flattening the bulb or container creates suction.  Passive drains. These drains allow fluid to drain naturally, by gravity. Drainage flows through a tube to a bandage (dressing) or a container outside of the body. Passive drains do not need to be emptied. A drain is placed during surgery. Right after surgery, drainage is usually bright red and a little thicker than water. The drainage may gradually turn yellow or pink and become thinner. It is likely that your health care provider will remove the drain when the drainage stops or when the amount decreases to 1-2 Tbsp (15-30 mL) during a 24-hour period. Supplies needed:  Tape.  Germ-free cleaning solution (sterile saline).  Cotton swabs.  Split gauze drain sponge: 4 x 4 inches (10 x 10 cm).  Gauze square: 4 x 4 inches (10 x 10 cm). How to care for your surgical  drain Care for your drain as told by your health care provider. This is important to help prevent infection. If your drain is placed at your back, or any other hard-to-reach area, ask another person to assist you in performing the following tasks: General care  Keep the skin around the drain dry and covered with a dressing at all times.  Check your drain area every day for signs of infection. Check for: ? Redness, swelling, or pain. ? Pus or a bad smell. ? Cloudy drainage. ? Tenderness or pressure at the drain exit site. Changing the dressing Follow instructions from your health care provider about how to change your dressing. Change your dressing at least once a day. Change it more often if needed to keep the dressing dry. Make sure you: 1. Gather your supplies. 2. Wash your hands with soap and water before you change your dressing. If soap and water are not available, use hand sanitizer. 3. Remove the old dressing. Avoid using scissors to do that. 4. Wash your hands with soap and water again after removing the old dressing. 5. Use sterile saline to clean your skin around the drain. You may need to use  a cotton swab to clean the skin. 6. Place the tube through the slit in a drain sponge. Place the drain sponge so that it covers your wound. 7. Place the gauze square or another drain sponge on top of the drain sponge that is on the wound. Make sure the tube is between those layers. 8. Tape the dressing to your skin. 9. Tape the drainage tube to your skin 1-2 inches (2.5-5 cm) below the place where the tube enters your body. Taping keeps the tube from pulling on any stitches (sutures) that you have. 10. Wash your hands with soap and water. 11. Write down the color of your drainage and how often you change your dressing. How to empty your active drain  1. Make sure that you have a measuring cup that you can empty your drainage into. 2. Wash your hands with soap and water. If soap and water  are not available, use hand sanitizer. 3. Loosen any pins or clips that hold the tube in place. 4. If your health care provider tells you to strip the tube to prevent clots and tube blockages: ? Hold the tube at the skin with one hand. Use your other hand to pinch the tubing with your thumb and first finger. ? Gently move your fingers down the tube while squeezing very lightly. This clears any drainage, clots, or tissue from the tube. ? You may need to do this several times each day to keep the tube clear. Do not pull on the tube. 5. Open the bulb cap or the drain plug. Do not touch the inside of the cap or the bottom of the plug. 6. Turn the device upside down and gently squeeze. 7. Empty all of the drainage into the measuring cup. 8. Compress the bulb or the container and replace the cap or the plug. To compress the bulb or the container, squeeze it firmly in the middle while you close the cap or plug the container. 9. Write down the amount of drainage that you have in each 24-hour period. If you have less than 2 Tbsp (30 mL) of drainage during 24 hours, contact your health care provider. 10. Flush the drainage down the toilet. 11. Wash your hands with soap and water. Contact a health care provider if:  You have redness, swelling, or pain around your drain area.  You have pus or a bad smell coming from your drain area.  You have a fever or chills.  The skin around your drain is warm to the touch.  The amount of drainage that you have is increasing instead of decreasing.  You have drainage that is cloudy.  There is a sudden stop or a sudden decrease in the amount of drainage that you have.  Your drain tube falls out.  Your active drain does not stay compressed after you empty it. Summary  Surgical drains are used to remove extra fluid that normally builds up in a surgical wound after surgery.  Different kinds of surgical drains include active drains and passive drains. Active  drains use suction to pull drainage away from the surgical wound, and passive drains allow fluid to drain naturally.  It is important to care for your drain to prevent infection. If your drain is placed at your back, or any other hard-to-reach area, ask another person to assist you.  Contact your health care provider if you have redness, swelling, or pain around your drain area. This information is not intended to replace advice given  to you by your health care provider. Make sure you discuss any questions you have with your health care provider. Document Revised: 03/17/2018 Document Reviewed: 03/17/2018 Elsevier Patient Education  2020 [place] Record Empty your surgical drain as told by your health care provider. Use this form to write down the amount of fluid that has collected in the drainage container. Bring this form with you to your follow-up visits.   Date __________ Time __________ Amount __________ Date __________ Time __________ Amount __________ Date __________ Time __________ Amount __________ Date __________ Time __________ Amount __________ Date __________ Time __________ Amount __________ Date __________ Time __________ Amount __________ Date __________ Time __________ Amount __________ Date __________ Time __________ Amount __________ Date __________ Time __________ Amount __________ Date __________ Time __________ Amount __________ Date __________ Time __________ Amount __________ Date __________ Time __________ Amount __________ Date __________ Time __________ Amount __________ Date __________ Time __________ Amount __________ Date __________ Time __________ Amount __________ Date __________ Time __________ Amount __________ Date __________ Time __________ Amount __________ Date __________ Time __________ Amount __________ Date __________ Time __________ Amount __________ Date __________ Time __________ Amount __________ Date __________ Time  __________ Amount __________ Date __________ Time __________ Amount __________ Date __________ Time __________ Amount __________ Date __________ Time __________ Amount __________ Date __________ Time __________ Amount __________ Date __________ Time __________ Amount __________ Date __________ Time __________ Amount __________ Date __________ Time __________ Amount __________ Date __________ Time __________ Amount __________ Date __________ Time __________ Amount __________ Date __________ Time __________ Amount __________ Date __________ Time __________ Amount __________ Date __________ Time __________ Amount __________ Date __________ Time __________ Amount __________ Date __________ Time __________ Amount __________ Date __________ Time __________ Amount __________ Date __________ Time __________ Amount __________ Date __________ Time __________ Amount __________ Date __________ Time __________ Amount __________ Date __________ Time __________ Amount __________ Date __________ Time __________ Amount __________ Date __________ Time __________ Amount __________ This information is not intended to replace advice given to you by your health care provider. Make sure you discuss any questions you have with your health care provider. Document Revised: 11/17/2016 Document Reviewed: 11/17/2016 Elsevier Patient Education  2020 [employer].

## 2020-01-08 NOTE — Progress Notes (Signed)
Progress Note  4 Days Post-Op  Subjective: Tolerating diet. Wants to go home. Drain still somewhat bloody but less so than yesterday.   Objective: Vital signs in last 24 hours: Temp:  [97.4 F (36.3 C)-98.2 F (36.8 C)] 98 F (36.7 C) (11/14 0420) Pulse Rate:  [74-83] 83 (11/13 2020) Resp:  [16-20] 16 (11/14 0420) BP: (138-158)/(72-88) 146/79 (11/14 0420) SpO2:  [97 %-100 %] 98 % (11/14 0420) Weight:  [87.3 kg] 87.3 kg (11/14 0600) Last BM Date: 01/06/20  Intake/Output from previous day: 11/13 0701 - 11/14 0700 In: 1087.6 [P.O.:640; I.V.:37.6; Blood:310; IV Piggyback:100] Out: 855 [Urine:725; Drains:130] Intake/Output this shift: No intake/output data recorded.  PE: Gen: Alert, NAD, pleasant Card: Reg Pulm: rate and effort normal Abd: Soft,mild distension,NT,+BS,lap incisions cdi, JP drain with SS fluid Psych: A&Ox4   Lab Results:  Recent Labs    01/07/20 0443 01/07/20 0443 01/07/20 1546 01/08/20 0150  WBC 9.7  --   --  8.3  HGB 7.4*   < > 8.5* 8.2*  HCT 21.7*   < > 24.6* 24.3*  PLT 136*  --   --  123*   < > = values in this interval not displayed.   BMET Recent Labs    01/07/20 0443 01/08/20 0150  NA 135 135  K 3.0* 3.5  CL 107 105  CO2 22 23  GLUCOSE 150* 136*  BUN 10 6*  CREATININE 0.77 0.73  CALCIUM 7.5* 7.8*   PT/INR No results for input(s): LABPROT, INR in the last 72 hours. CMP     Component Value Date/Time   NA 135 01/08/2020 0150   K 3.5 01/08/2020 0150   CL 105 01/08/2020 0150   CO2 23 01/08/2020 0150   GLUCOSE 136 (H) 01/08/2020 0150   BUN 6 (L) 01/08/2020 0150   CREATININE 0.73 01/08/2020 0150   CALCIUM 7.8 (L) 01/08/2020 0150   PROT 5.1 (L) 01/06/2020 0356   ALBUMIN 2.4 (L) 01/06/2020 0356   AST 20 01/06/2020 0356   ALT 31 01/06/2020 0356   ALKPHOS 43 01/06/2020 0356   BILITOT 0.9 01/06/2020 0356   GFRNONAA >60 01/08/2020 0150   GFRAA >60 06/19/2017 1633   Lipase     Component Value Date/Time   LIPASE 33  01/05/2020 0218       Studies/Results: No results found.  Anti-infectives: Anti-infectives (From admission, onward)   Start     Dose/Rate Route Frequency Ordered Stop   01/07/20 1300  amoxicillin-clavulanate (AUGMENTIN) 875-125 MG per tablet 1 tablet        1 tablet Oral Every 12 hours 01/07/20 1207 01/11/20 0959   01/02/20 0345  piperacillin-tazobactam (ZOSYN) IVPB 3.375 g  Status:  Discontinued        3.375 g 12.5 mL/hr over 240 Minutes Intravenous Every 8 hours 01/02/20 0343 01/07/20 1207       Assessment/Plan HTN HLD DM A fib - eliquis on hold ABL anemia -s/p3 units PRBCs,VSS, hgb 8.2, stable  Biliary pancreatitis, cholecystitis S/p laparoscopic cholecystectomy with drain placement 11/10 Dr. Ninfa Linden - POD#4 - unable to perform IOC,LFT's normalized -Lipase WNL -drain bloody but not bilious - d/c home with drain and follow up in office this week for possible removal - Mobilize, PT - Pulm toilet. - stable for discharge from a surgical perspective  FEN -soft diet VTE -SCDs only for now, will discuss eliquis with MD ID -[id number]/13, ok to transition to PO augmentin  Foley - none Follow up - TBD  LOS: 6 days  Norm Parcel , [facility] 01/08/2020, 10:25 AM Please see Amion for pager number during day hours 7:00am-4:30pm

## 2020-01-09 ENCOUNTER — Ambulatory Visit: Payer: Self-pay

## 2020-01-09 ENCOUNTER — Other Ambulatory Visit: Payer: Self-pay

## 2020-01-09 NOTE — Patient Outreach (Signed)
[facility]) Care Management  01/09/2020  Andrew Humphrey 10/29/1940 [id number]   EMMI-General Discharge RED ON EMMI ALERT Day #4 Date:12/14/19 Red Alert Reason:" Got discharge papers? I don't know Know who to call about changes in condition? No New prescriptions? I don't know Sad/hopeless/anxious/empty? Yes   Multiple attempts to establish contact with patient without success. No response from letter mailed to patient. Case is being closed at this time.    Plan: RN CM will close case at this time.  Enzo Montgomery, RN,BSN,CCM [facility] Telephonic Care Management Coordinator Direct Phone: [phone] Toll Free: [phone] Fax: [phone]

## 2020-01-10 DIAGNOSIS — D649 Anemia, unspecified: Secondary | ICD-10-CM | POA: Diagnosis not present

## 2020-01-10 DIAGNOSIS — Z6829 Body mass index (BMI) 29.0-29.9, adult: Secondary | ICD-10-CM | POA: Diagnosis not present

## 2020-01-18 ENCOUNTER — Other Ambulatory Visit: Payer: Self-pay | Admitting: Surgery

## 2020-01-18 ENCOUNTER — Other Ambulatory Visit (HOSPITAL_COMMUNITY): Payer: Self-pay | Admitting: Surgery

## 2020-01-18 DIAGNOSIS — Z9049 Acquired absence of other specified parts of digestive tract: Secondary | ICD-10-CM | POA: Diagnosis not present

## 2020-01-18 NOTE — Discharge Summary (Signed)
Physician Discharge Summary  Andrew Humphrey LGX:[id number] DOB: 1940-08-30 DOA: 01/01/2020  PCP: Andrew Bal, PA-C  Admit date: 01/01/2020 Discharge date: 01/08/2020  Time spent: 35 minutes  Recommendations for Outpatient Follow-up:  1. PCP in 1 week with labs 2. Restart apixaban on 11/19 3. General Humphrey 1 week, evaluate for drain removal   Discharge Diagnoses:  Acute biliary pancreatitis Acute cholecystitis Shock   Diabetes mellitus without complication ([place])   Atrial fibrillation ([place])   Acute biliary pancreatitis   Discharge Condition: Improved  Diet recommendation: Low-fat, heart healthy  Filed Weights   01/06/20 0055 01/07/20 0549 01/08/20 0600  Weight: 90.7 kg 89.5 kg 87.3 kg    History of present illness:  [name] with history of atrial fibrillation on Eliquis, diabetes mellitus type 2, hypertension, recent history of Klebsiella bacteremia secondary to cholecystitis who presented to the emergency department with upper abdominal pain, nausea, vomiting.  He [facility] for similar complaint, found to have cholecystitis and underwent conservative management and was discharged home.  He was evaluated at outside hospital where EKG was concerning for STEMI and was sent to the emergency department for cardiology evaluation.  EKG features resolved at the time he presented to [facility].  Troponins were normal, code STEMI canceled. -He was found to have elevated LFTs bilirubin and lipase of 3987 -Right upper quadrant ultrasound noted gallbladder sludge, gallbladder wall thickening and positive Murphys sign   Hospital Course:   Acute biliary pancreatitis:  Post op Shock/hypotension -Admitted with abdominal pain nausea and vomiting associated with elevated lipase LFTs and bilirubin  - Ultrasound showed acute cholecystitis with no biliary dilation, General Humphrey following, underwent lap chole without IOC and drain placement on 81/44, complicated by  severe postop hypotension likely from SIRS and blood loss, given 3 L of IV fluids and 2 units of PRBC, stable and improved -Eliquis on hold due to postop hypotension and blood loss, advised to restart this in another 4 to 5 days i.e. 11/19 -He will be discharged home with the drain, to follow-up with general Humphrey next week -Please check labs at follow-up  Acute blood loss anemia -Transfused 3 units of PRBC postop for hypotension and acute blood loss anemia also given IV iron -Hemoglobin improved and stable now, Eliquis on hold at the time of discharge with the plan to restarted on 11/19  Paroxysmal A. fib: Currently in normal sinus rhythm.CHADS-VASc at least 79 (age x2, DM) on amiodarone and metoprolol.   -Eliquis on hold, would avoid anticoagulation at least for 4-5 more days in the setting of complicated Humphrey, see discussion above  Diabetes type 2: Hemoglobin A1c was 9.9 in October 2021.  -CBG stable, on low-dose Lantus  Discharge Exam: Vitals:   01/08/20 0420 01/08/20 1118  BP: (!) 146/79 (!) 162/91  Pulse:  70  Resp: 16 18  Temp: 98 F (36.7 C) 98.6 F (37 C)  SpO2: 98% 98%    General: AAOx3 Cardiovascular: S1-S2, regular rate rhythm Respiratory: Decreased breath sounds to bases  Discharge Instructions   Discharge Instructions    Diet - low sodium heart healthy   Complete by: As directed    Diet Carb Modified   Complete by: As directed    Discharge wound care:   Complete by: As directed    Routine drain care   Increase activity slowly   Complete by: As directed      Allergies as of 01/08/2020   No Known Allergies  Medication List    TAKE these medications   acetaminophen 325 MG tablet Commonly known as: TYLENOL Take 2 tablets (650 mg total) by mouth every 6 (six) hours as needed for mild pain (or Fever >/= 101).   amiodarone 200 MG tablet Commonly known as: PACERONE Take 1 tablet (200 mg total) by mouth daily.   apixaban 5 MG Tabs  tablet Commonly known as: ELIQUIS Take 1 tablet (5 mg total) by mouth 2 (two) times daily. Restart on 11/19 What changed: additional instructions   glipiZIDE 10 MG 24 hr tablet Commonly known as: GLUCOTROL XL Take 20 mg by mouth daily.   metFORMIN 1000 MG tablet Commonly known as: GLUCOPHAGE Take 1,000 mg by mouth in the morning and at bedtime.   metoprolol succinate 50 MG 24 hr tablet Commonly known as: Toprol XL Take 1 tablet (50 mg total) by mouth daily. Take with or immediately following a meal.   omeprazole 40 MG capsule Commonly known as: PRILOSEC Take 40 mg by mouth daily as needed (indigestion).   oxyCODONE 5 MG immediate release tablet Commonly known as: Oxy IR/ROXICODONE Take 1 tablet (5 mg total) by mouth every 6 (six) hours as needed for moderate pain or severe pain.     ASK your doctor about these medications   amoxicillin-clavulanate 875-125 MG tablet Commonly known as: AUGMENTIN Take 1 tablet by mouth every 12 (twelve) hours for 4 days. Ask about: Should I take this medication?            Discharge Care Instructions  (From admission, onward)         Start     Ordered   01/08/20 0000  Discharge wound care:       Comments: Routine drain care   01/08/20 1031         No Known Allergies  Follow-up Information    Humphrey, Andrew Humphrey. Go on 01/31/2020.   Specialty: General Humphrey Why: 130pm. Please arrive 30 minutes prior to your appointment. Please bring a copy of your photo ID and insurance card.  Contact information: [address] [place] [address] [phone]        Andrew Humphrey, [place]. Go on 01/11/2020.   Specialty: General Humphrey Why: 930am. This is a nurse visit for a drain check and possible removal. Please arrive 30 minutes prior to your appointment. Please bring a copy of your photo ID and insurance card.  Contact information: [address] [place]  [place] [phone]       Andrew Bal, PA-C Follow up.   Specialty: Family Medicine Contact information: [place] [address] [phone]        Andrew Sark, MD .   Specialty: Cardiology Contact information: [place] [address] [phone]                The results of significant diagnostics from this hospitalization (including imaging, microbiology, ancillary and laboratory) are listed below for reference.    Significant Diagnostic Studies: DG Abd Portable 1V  Result Date: 01/04/2020 CLINICAL DATA:  Lower abdominal pain EXAM: PORTABLE ABDOMEN - 1 VIEW COMPARISON:  None. FINDINGS: No dilated large or small bowel. There is contrast within the colon which is nondistended. Gas in the rectum. IMPRESSION: No evidence of bowel obstruction. Electronically Signed   By: Suzy Bouchard M.D.   On: 01/04/2020 20:34   US Abdomen Limited RUQ (LIVER/GB)  Result Date: 01/02/2020 CLINICAL  DATA:  Initial evaluation for acute abdominal pain, elevated bilirubin, lipase, and white blood cell count. EXAM: ULTRASOUND ABDOMEN LIMITED RIGHT UPPER QUADRANT COMPARISON:  Prior MRI from 12/06/2019 FINDINGS: Gallbladder: Scattered echogenic material seen within the gallbladder lumen, consistent with sludge. Few scattered small stones versus tumefactive sludge measuring up to 8 mm noted. Gallbladder wall appears thickened up to approximately 8 mm. No free pericholecystic fluid. A positive sonographic Murphy sign was elicited on exam. Common bile duct: Diameter: 5.6 mm Liver: No focal lesion identified. Within normal limits in parenchymal echogenicity. Portal vein is patent on color Doppler imaging with normal direction of blood flow towards the liver. Other: Small volume free fluid present within the right upper quadrant. IMPRESSION: 1. Gallbladder sludge and possible stones with associated gallbladder wall thickening and positive sonographic Murphy sign. Clinical  correlation for possible acute cholecystitis recommended. 2. No biliary dilatation.  No visible choledocholithiasis. 3. Small volume free fluid within the right upper quadrant. Electronically Signed   By: Jeannine Boga M.D.   On: 01/02/2020 01:48    Microbiology: No results found for this or any previous visit (from the past 240 hour(s)).   Labs: Basic Metabolic Panel: No results for input(s): NA, K, CL, CO2, GLUCOSE, BUN, CREATININE, CALCIUM, MG, PHOS in the last 168 hours. Liver Function Tests: No results for input(s): AST, ALT, ALKPHOS, BILITOT, PROT, ALBUMIN in the last 168 hours. No results for input(s): LIPASE, AMYLASE in the last 168 hours. No results for input(s): AMMONIA in the last 168 hours. CBC: No results for input(s): WBC, NEUTROABS, HGB, HCT, MCV, PLT in the last 168 hours. Cardiac Enzymes: No results for input(s): CKTOTAL, CKMB, CKMBINDEX, TROPONINI in the last 168 hours. BNP: BNP (last 3 results) No results for input(s): BNP in the last 8760 hours.  ProBNP (last 3 results) No results for input(s): PROBNP in the last 8760 hours.  CBG: No results for input(s): GLUCAP in the last 168 hours.     Signed:  Domenic Polite MD.  [facility] 01/18/2020, 3:22 PM

## 2020-01-26 ENCOUNTER — Other Ambulatory Visit: Payer: Self-pay

## 2020-01-26 ENCOUNTER — Other Ambulatory Visit (HOSPITAL_COMMUNITY): Payer: Self-pay | Admitting: Surgery

## 2020-01-26 ENCOUNTER — Encounter (HOSPITAL_COMMUNITY)
Admission: RE | Admit: 2020-01-26 | Discharge: 2020-01-26 | Disposition: A | Discharge status: Home / Self Care | Payer: Medicare HMO | Source: Ambulatory Visit | Attending: Surgery | Admitting: Surgery

## 2020-01-26 DIAGNOSIS — R109 Unspecified abdominal pain: Secondary | ICD-10-CM | POA: Diagnosis not present

## 2020-01-26 DIAGNOSIS — Z9049 Acquired absence of other specified parts of digestive tract: Secondary | ICD-10-CM

## 2020-01-26 MED ORDER — TECHNETIUM TC 99M MEBROFENIN IV KIT
5.5000 | PACK | Freq: Once | INTRAVENOUS | Status: AC | PRN
  Administered 2020-01-26: 5.5 via INTRAVENOUS

## 2020-01-27 ENCOUNTER — Other Ambulatory Visit (HOSPITAL_COMMUNITY)
Admission: RE | Admit: 2020-01-27 | Discharge: 2020-01-27 | Disposition: A | Discharge status: Home / Self Care | Payer: Medicare HMO | Source: Ambulatory Visit | Attending: Cardiology | Admitting: Cardiology

## 2020-01-27 DIAGNOSIS — I10 Essential (primary) hypertension: Secondary | ICD-10-CM | POA: Insufficient documentation

## 2020-01-27 DIAGNOSIS — I4891 Unspecified atrial fibrillation: Secondary | ICD-10-CM | POA: Diagnosis not present

## 2020-01-27 LAB — T4, FREE: Free T4: 1.11 ng/dL (ref 0.61–1.12)

## 2020-01-27 LAB — TSH: TSH: 6.092 u[IU]/mL — ABNORMAL HIGH (ref 0.350–4.500)

## 2020-02-02 ENCOUNTER — Other Ambulatory Visit: Payer: Self-pay

## 2020-02-02 ENCOUNTER — Encounter: Payer: Self-pay | Admitting: Student

## 2020-02-02 ENCOUNTER — Ambulatory Visit: Payer: Medicare HMO | Admitting: Student

## 2020-02-02 VITALS — BP 152/82 | HR 78 | Ht 67.0 in | Wt 178.0 lb

## 2020-02-02 DIAGNOSIS — I480 Paroxysmal atrial fibrillation: Secondary | ICD-10-CM

## 2020-02-02 DIAGNOSIS — D649 Anemia, unspecified: Secondary | ICD-10-CM

## 2020-02-02 DIAGNOSIS — I10 Essential (primary) hypertension: Secondary | ICD-10-CM | POA: Diagnosis not present

## 2020-02-02 MED ORDER — METOPROLOL SUCCINATE ER 100 MG PO TB24: 100.0000 mg | ORAL_TABLET | Freq: Every day | ORAL | 3 refills | Status: DC

## 2020-02-02 NOTE — Patient Instructions (Signed)
Medication Instructions:  INCREASE Toprol XL to 100 mg daily  *If you need a refill on your cardiac medications before your next appointment, please call your pharmacy*   Lab Work: None today If you have labs (blood work) drawn today and your tests are completely normal, you will receive your results only by: Marland Kitchen MyChart Message (if you have MyChart) OR . A paper copy in the mail If you have any lab test that is abnormal or we need to change your treatment, we will call you to review the results.   Testing/Procedures: None today   Follow-Up: At [facility], you and your health needs are our priority.  As part of our continuing mission to provide you with exceptional heart care, we have created designated Provider Care Teams.  These Care Teams include your primary Cardiologist (physician) and Advanced Practice Providers (APPs -  Physician Assistants and Nurse Practitioners) who all work together to provide you with the care you need, when you need it.  We recommend signing up for the patient portal called "MyChart".  Sign up information is provided on this After Visit Summary.  MyChart is used to connect with patients for Virtual Visits (Telemedicine).  Patients are able to view lab/test results, encounter notes, upcoming appointments, etc.  Non-urgent messages can be sent to your provider as well.   To learn more about what you can do with MyChart, go to [email or web address].    Your next appointment:   6 month(s)  The format for your next appointment:   In Person  Provider:   Rozann Lesches, MD   Other Instructions None     Thank you for choosing [facility] !

## 2020-02-02 NOTE — Progress Notes (Signed)
Cardiology Office Note    Date:  02/02/2020   ID:  Andrew Humphrey, DOB [birth year], MRN [id number]  PCP:  Lanelle Bal, PA-C  Cardiologist: Rozann Lesches, MD    Chief Complaint  Patient presents with  . Hospitalization Follow-up    History of Present Illness:    Andrew Humphrey is a 79 y.o. male with past medical history of paroxysmal atrial fibrillation, HTN, and history of GI bleed who presents to the office today for hospital follow-up.  He was last examined in clinic by Kerin Ransom, PA-C in 11/2019 following a recent hospitalization for cholecystitis and Klebsiella sepsis. Cardiology had been consulted during that admission due to atrial fibrillation with RVR but he converted back to normal sinus rhythm with IV Amiodarone. He was doing well at the time of his visit and Amiodarone was reduced to 200 mg daily and Toprol-XL was reduced to 50 mg daily with ASA being discontinued given the use of Eliquis.  In the interim, he was admitted to [facility] in 12/2019 for evaluation of epigastric pain along with nausea and vomiting. His EKG showed lateral ST elevation and reciprocal ST depression, therefore he was transferred to [facility] as a Code STEMI. At that time, his repeat EKG showed resolution of his ST segment changes and his clinical presentation was overall felt to be most consistent with cholecystitis as Lipase was elevated to 3987 and LFT's were elevated as well (AST 183 and ALT 159). HS Troponin values were negative and bedside echo showed no acute WMA. He did ultimately undergo laparoscopic cholecystectomy on 01/04/2020. He did develop hypotension in the postoperative setting which was felt to be secondary to SIRS and acute blood loss. This improved with administration of IV fluids and 2 units PRBCs. Eliquis was held in the postoperative setting and was recommend to resume this following discharge (listed in d/c summary to resume on 01/13/2020).  In talking with the  patient and his significant other today, he reports overall doing well since his hospitalization. He feels like his energy level and stamina continue to improve. He was evaluated by General Surgery earlier this week and his cholecystectomy drains were removed. He denies any recent chest pain or dyspnea on exertion. No recent palpitations, orthopnea, PND or lower extremity edema. He did restart Eliquis and denies any evidence of active bleeding.   Past Medical History:  Diagnosis Date  . Anemia   . Essential hypertension   . History of atrial fibrillation   . History of GI bleed   . Type 2 diabetes mellitus ([place])     Past Surgical History:  Procedure Laterality Date  . CHOLECYSTECTOMY N/A 01/04/2020   Procedure: LAPAROSCOPIC CHOLECYSTECTOMY;  Surgeon: Coralie Keens, MD;  Location: [facility];  Service: General;  Laterality: N/A;  . COLONOSCOPY N/A 11/09/2016   Procedure: COLONOSCOPY;  Surgeon: Danie Binder, MD;  [facility];  Service: Endoscopy;  Laterality: N/A;  . [place]  . TONSILLECTOMY      Current Medications: Outpatient Medications Prior to Visit  Medication Sig Dispense Refill  . acetaminophen (TYLENOL) 325 MG tablet Take 2 tablets (650 mg total) by mouth every 6 (six) hours as needed for mild pain (or Fever >/= 101).    Marland Kitchen amiodarone (PACERONE) 200 MG tablet Take 1 tablet (200 mg total) by mouth daily. 90 tablet 3  . apixaban (ELIQUIS) 5 MG TABS tablet Take 1 tablet (5 mg total) by mouth 2 (two) times  daily. Restart on 11/19    . glipiZIDE (GLUCOTROL XL) 10 MG 24 hr tablet Take 20 mg by mouth daily.    . metFORMIN (GLUCOPHAGE) 1000 MG tablet Take 1,000 mg by mouth in the morning and at bedtime.     Marland Kitchen omeprazole (PRILOSEC) 40 MG capsule Take 40 mg by mouth daily as needed (indigestion).     . metoprolol succinate (TOPROL XL) 50 MG 24 hr tablet Take 1 tablet (50 mg total) by mouth daily. Take with or immediately following a meal. 90 tablet 3  .  oxyCODONE (OXY IR/ROXICODONE) 5 MG immediate release tablet Take 1 tablet (5 mg total) by mouth every 6 (six) hours as needed for moderate pain or severe pain. 30 tablet 0   No facility-administered medications prior to visit.     Allergies:   Patient has no known allergies.   Social History   Socioeconomic History  . Marital status: Single    Spouse name: Not on file  . Number of children: Not on file  . Years of education: Not on file  . Highest education level: Not on file  Occupational History  . Not on file  Tobacco Use  . Smoking status: Never Smoker  . Smokeless tobacco: Never Used  Vaping Use  . Vaping Use: Never used  Substance and Sexual Activity  . Alcohol use: Yes    Comment: occasional beer  . Drug use: No  . Sexual activity: Not on file  Other Topics Concern  . Not on file  Social History Narrative  . Not on file   Social Determinants of Health   Financial Resource Strain: Not on file  Food Insecurity: Not on file  Transportation Needs: Not on file  Physical Activity: Not on file  Stress: Not on file  Social Connections: Not on file     Family History:  The patient's family history includes COPD in his sister; Hypertension in his brother and father.   Review of Systems:   Please see the history of present illness.     General:  No chills, fever, night sweats or weight changes.  Cardiovascular:  No chest pain, dyspnea on exertion, edema, orthopnea, palpitations, paroxysmal nocturnal dyspnea. Dermatological: No rash, lesions/masses Respiratory: No cough, dyspnea Urologic: No hematuria, dysuria Abdominal:   No nausea, vomiting, diarrhea, bright red blood per rectum, melena, or hematemesis. Positive for abdominal pain (improved).  Neurologic:  No visual changes, wkns, changes in mental status. All other systems reviewed and are otherwise negative except as noted above.   Physical Exam:    VS:  BP (!) 152/82   Pulse 78   Ht 5\' 7"  (1.702 m)   Wt  178 lb (80.7 kg)   SpO2 98%   BMI 27.88 kg/m    General: Well developed, well nourished,male appearing in no acute distress. Head: Normocephalic, atraumatic. Neck: No carotid bruits. JVD not elevated.  Lungs: Respirations regular and unlabored, without wheezes or rales.  Heart: Regular rate and rhythm. No S3 or S4.  No murmur, no rubs, or gallops appreciated. Abdomen: Appears non-distended. No obvious abdominal masses. Msk:  Strength and tone appear normal for age. No obvious joint deformities or effusions. Extremities: No clubbing or cyanosis. No lower extremity edema.  Distal pedal pulses are 2+ bilaterally. Neuro: Alert and oriented X 3. Moves all extremities spontaneously. No focal deficits noted. Psych:  Responds to questions appropriately with a normal affect. Skin: No rashes or lesions noted  Wt Readings from Last 3 Encounters:  02/02/20 178 lb (80.7 kg)  01/08/20 192 lb 6.4 oz (87.3 kg)  12/19/19 182 lb 3.2 oz (82.6 kg)     Studies/Labs Reviewed:   EKG:  EKG is not ordered today.   Recent Labs: 01/06/2020: ALT 31 01/07/2020: Magnesium 1.8 01/08/2020: BUN 6; Creatinine, Ser 0.73; Hemoglobin 8.2; Platelets 123; Potassium 3.5; Sodium 135 01/27/2020: TSH 6.092   Lipid Panel    Component Value Date/Time   CHOL 150 01/01/2020 2347   TRIG 39 01/01/2020 2347   HDL 61 01/01/2020 2347   CHOLHDL 2.5 01/01/2020 2347   VLDL 8 01/01/2020 2347   LDLCALC NOT CALCULATED 01/01/2020 2347    Additional studies/ records that were reviewed today include:   Echocardiogram: 12/09/2019 IMPRESSIONS    1. MIld distal septal hypokinesis. . Left ventricular ejection fraction,  by estimation, is 55 to 60%. The left ventricle has normal function. The  left ventricle demonstrates regional wall motion abnormalities (see  scoring diagram/findings for  description). There is mild left ventricular hypertrophy. Left ventricular  diastolic parameters were normal.  2. Right ventricular  systolic function is mildly reduced. The right  ventricular size is normal. There is mildly elevated pulmonary artery  systolic pressure.  3. Left atrial size was mild to moderately dilated.  4. The mitral valve is abnormal. Moderate mitral valve regurgitation.  5. The aortic valve is abnormal. Aortic valve regurgitation is trivial.  Mild to moderate aortic valve sclerosis/calcification is present, without  any evidence of aortic stenosis.  6. The inferior vena cava is dilated in size with <50% respiratory  variability, suggesting right atrial pressure of 15 mmHg.   Assessment:    1. PAF (paroxysmal atrial fibrillation) ([place])   2. Essential hypertension   3. Anemia, unspecified type      Plan:   In order of problems listed above:  1. Paroxysmal Atrial Fibrillation - Diagnosed during his admission for cholecystitis and Klebsiella sepsis and he converted back to NSR with IV Amiodarone. He maintained NSR during his most recent admission and is in NSR by examination today. He denies any recent palpitations. Continue Amiodarone 200mg  daily and Toprol-XL with dose adjustment to 100mg  daily as outlined below. Will request most recent labs from PCP to make sure LFT's have improved following his recent hospitalization and in the setting of Amiodarone use.  - He denies any evidence of active bleeding. Continue Eliquis 5mg  BID for anticoagulation.   2. HTN - BP was initially recorded as 172/96, rechecked and improved to 152/82 but he reports similar readings when at prior offices visits earlier this week. He was on Toprol-XL 100mg  daily prior to his diagnosis of atrial fibrillation and says his BP was well-controlled with this and he overall tolerated it very well. Will plan to titrate to his prior dosing of Toprol-XL 100mg  daily. He does have follow-up with his PCP next week and if BP remains above goal, would consider adding Amlodipine or Losartan.   3. Post-operative Anemia - Hgb was at  8.2 on 01/08/2020 and he reports this was checked by his PCP in the interim. Will request a copy of his most recent labs. He remains on iron supplementation.    Medication Adjustments/Labs and Tests Ordered: Current medicines are reviewed at length with the patient today.  Concerns regarding medicines are outlined above.  Medication changes, Labs and Tests ordered today are listed in the Patient Instructions below. Patient Instructions  Medication Instructions:  INCREASE Toprol XL to 100 mg daily  *If you need a  refill on your cardiac medications before your next appointment, please call your pharmacy*   Lab Work: None today If you have labs (blood work) drawn today and your tests are completely normal, you will receive your results only by: Marland Kitchen MyChart Message (if you have MyChart) OR . A paper copy in the mail If you have any lab test that is abnormal or we need to change your treatment, we will call you to review the results.   Testing/Procedures: None today   Follow-Up: At [facility], you and your health needs are our priority.  As part of our continuing mission to provide you with exceptional heart care, we have created designated Provider Care Teams.  These Care Teams include your primary Cardiologist (physician) and Advanced Practice Providers (APPs -  Physician Assistants and Nurse Practitioners) who all work together to provide you with the care you need, when you need it.  We recommend signing up for the patient portal called "MyChart".  Sign up information is provided on this After Visit Summary.  MyChart is used to connect with patients for Virtual Visits (Telemedicine).  Patients are able to view lab/test results, encounter notes, upcoming appointments, etc.  Non-urgent messages can be sent to your provider as well.   To learn more about what you can do with MyChart, go to [email or web address].    Your next appointment:   6 month(s)  The format for your next  appointment:   In Person  Provider:   Rozann Lesches, MD   Other Instructions None     Thank you for choosing [facility] !            Signed, Erma Heritage, PA-C  02/02/2020 7:25 PM    [place] [address] [place], [place] [address] Phone: [phone] Fax: [phone]

## 2020-02-07 DIAGNOSIS — K859 Acute pancreatitis without necrosis or infection, unspecified: Secondary | ICD-10-CM | POA: Diagnosis not present

## 2020-02-07 DIAGNOSIS — R1310 Dysphagia, unspecified: Secondary | ICD-10-CM | POA: Diagnosis not present

## 2020-02-07 DIAGNOSIS — E611 Iron deficiency: Secondary | ICD-10-CM | POA: Diagnosis not present

## 2020-02-07 DIAGNOSIS — E1165 Type 2 diabetes mellitus with hyperglycemia: Secondary | ICD-10-CM | POA: Diagnosis not present

## 2020-02-07 DIAGNOSIS — K819 Cholecystitis, unspecified: Secondary | ICD-10-CM | POA: Diagnosis not present

## 2020-02-07 DIAGNOSIS — D649 Anemia, unspecified: Secondary | ICD-10-CM | POA: Diagnosis not present

## 2020-02-07 DIAGNOSIS — E039 Hypothyroidism, unspecified: Secondary | ICD-10-CM | POA: Diagnosis not present

## 2020-02-07 DIAGNOSIS — I4891 Unspecified atrial fibrillation: Secondary | ICD-10-CM | POA: Diagnosis not present

## 2020-02-07 DIAGNOSIS — Z6829 Body mass index (BMI) 29.0-29.9, adult: Secondary | ICD-10-CM | POA: Diagnosis not present

## 2020-02-07 DIAGNOSIS — I10 Essential (primary) hypertension: Secondary | ICD-10-CM | POA: Diagnosis not present

## 2020-03-19 DIAGNOSIS — R059 Cough, unspecified: Secondary | ICD-10-CM | POA: Diagnosis not present

## 2020-03-19 DIAGNOSIS — Z20828 Contact with and (suspected) exposure to other viral communicable diseases: Secondary | ICD-10-CM | POA: Diagnosis not present

## 2020-08-23 DIAGNOSIS — Z6831 Body mass index (BMI) 31.0-31.9, adult: Secondary | ICD-10-CM | POA: Diagnosis not present

## 2020-08-23 DIAGNOSIS — E1165 Type 2 diabetes mellitus with hyperglycemia: Secondary | ICD-10-CM | POA: Diagnosis not present

## 2020-08-23 DIAGNOSIS — I10 Essential (primary) hypertension: Secondary | ICD-10-CM | POA: Diagnosis not present

## 2020-08-23 DIAGNOSIS — I4891 Unspecified atrial fibrillation: Secondary | ICD-10-CM | POA: Diagnosis not present

## 2020-09-24 DIAGNOSIS — I10 Essential (primary) hypertension: Secondary | ICD-10-CM | POA: Diagnosis not present

## 2020-09-24 DIAGNOSIS — E1165 Type 2 diabetes mellitus with hyperglycemia: Secondary | ICD-10-CM | POA: Diagnosis not present

## 2020-09-24 DIAGNOSIS — I700 Atherosclerosis of aorta: Secondary | ICD-10-CM | POA: Diagnosis not present

## 2020-09-24 DIAGNOSIS — Z6831 Body mass index (BMI) 31.0-31.9, adult: Secondary | ICD-10-CM | POA: Diagnosis not present

## 2020-09-24 DIAGNOSIS — I4891 Unspecified atrial fibrillation: Secondary | ICD-10-CM | POA: Diagnosis not present

## 2020-09-24 DIAGNOSIS — D649 Anemia, unspecified: Secondary | ICD-10-CM | POA: Diagnosis not present

## 2020-10-22 DIAGNOSIS — I10 Essential (primary) hypertension: Secondary | ICD-10-CM | POA: Diagnosis not present

## 2020-10-22 DIAGNOSIS — L03116 Cellulitis of left lower limb: Secondary | ICD-10-CM | POA: Diagnosis not present

## 2020-10-22 DIAGNOSIS — Z6832 Body mass index (BMI) 32.0-32.9, adult: Secondary | ICD-10-CM | POA: Diagnosis not present

## 2020-11-21 ENCOUNTER — Other Ambulatory Visit: Payer: Self-pay | Admitting: Cardiology

## 2020-11-21 NOTE — Telephone Encounter (Signed)
Prescription refill request for Eliquis received. Indication: PAF Last office visit: 02/02/20  B Strader PA-C Scr: 0.83 on 08/23/20 Age: 80 Weight: 80.7kg  Based on above findings Eliquis 5mg  twice daily is the appropriate dose.  Refill approved.

## 2020-11-25 IMAGING — DX DG ABD PORTABLE 1V
3 series · 3 of 3 positions shown · non-contrast
Comparison: None.

CLINICAL DATA: Lower abdominal pain

EXAM:
PORTABLE ABDOMEN - 1 VIEW

[abdomen kub (1 of 3)]
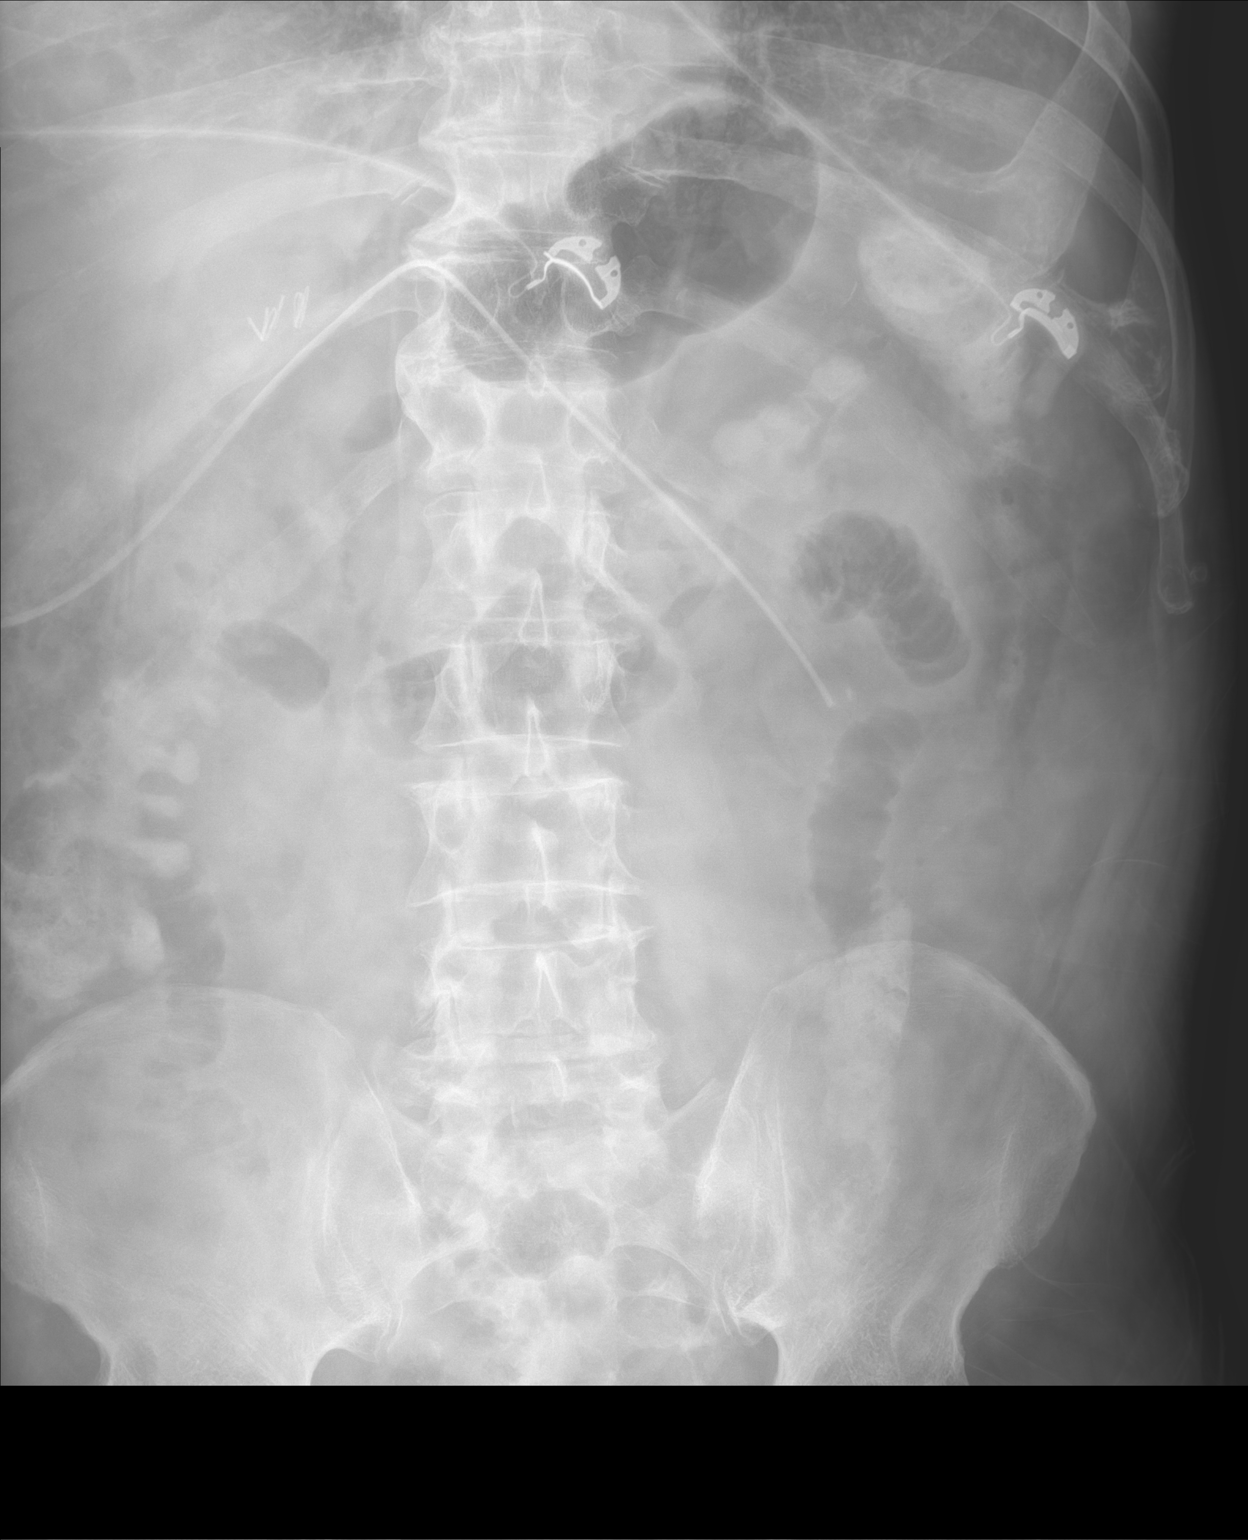

[abdomen kub (2 of 3)]
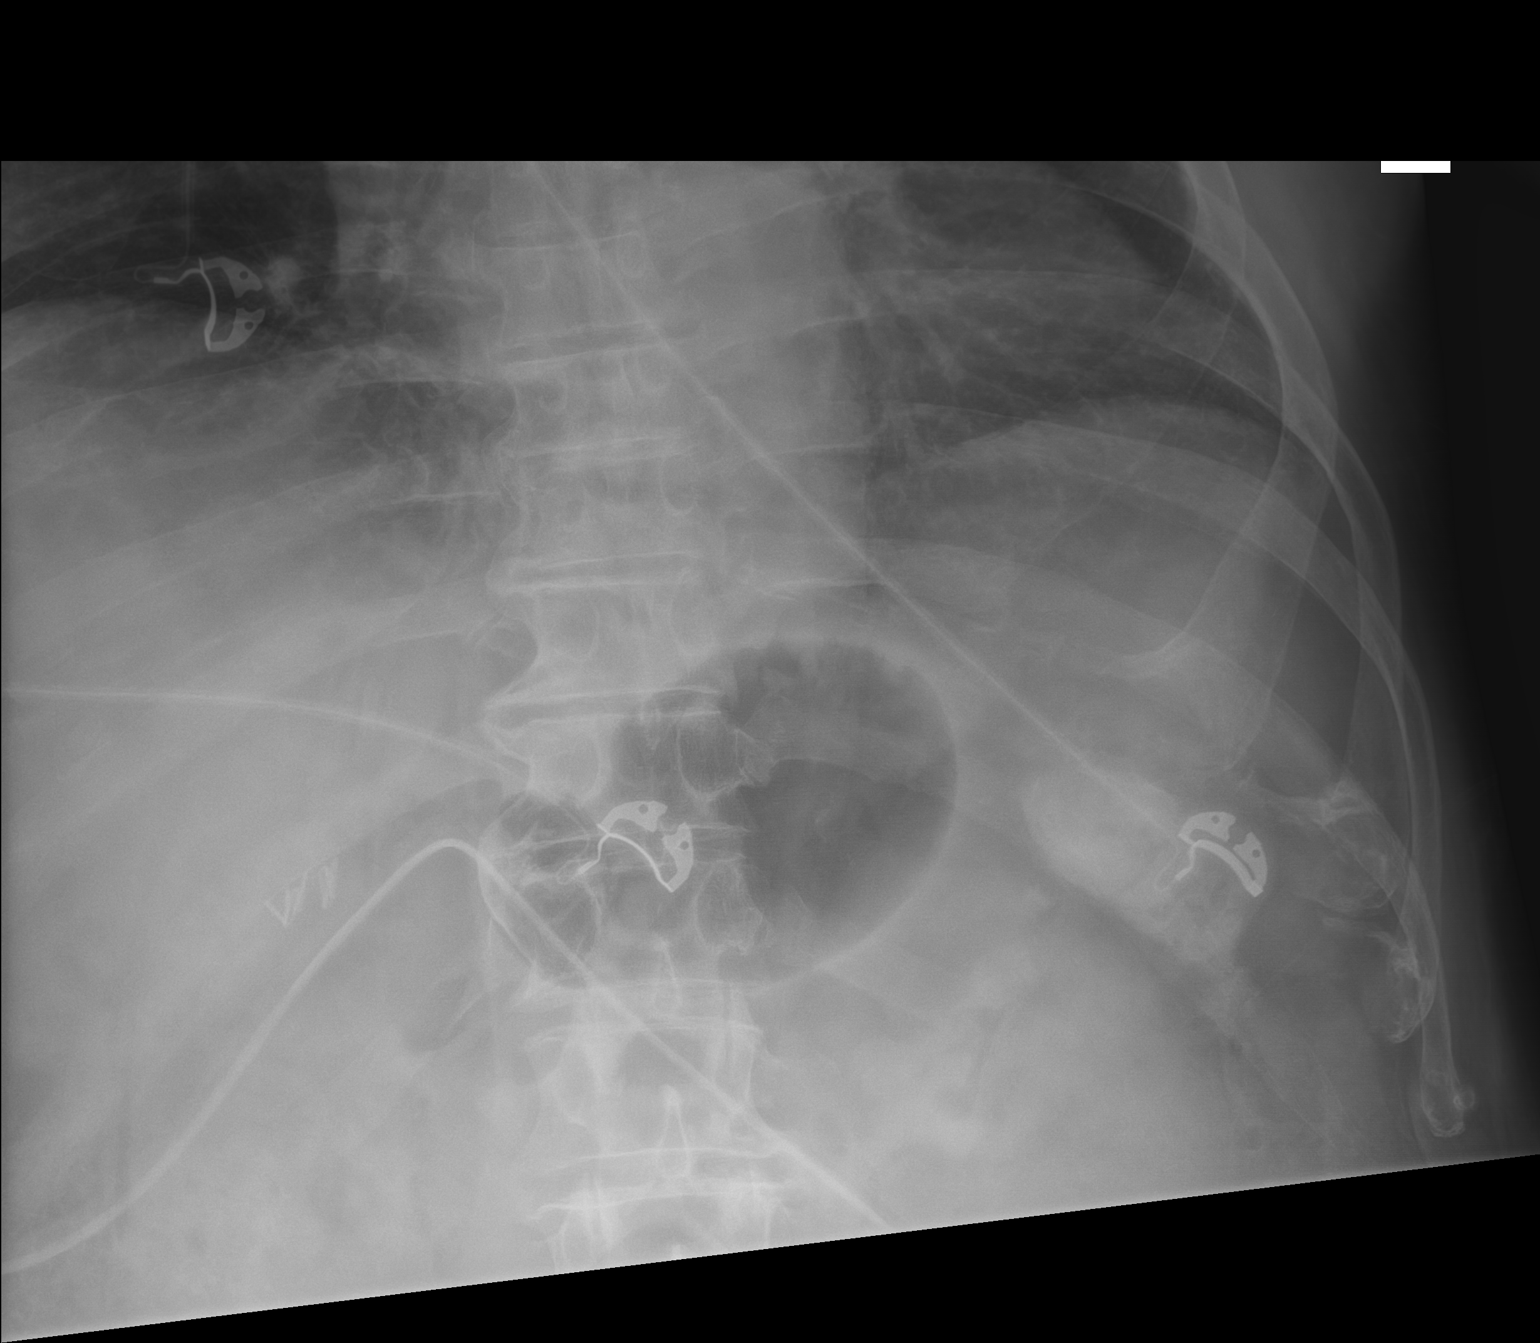

[abdomen kub (3 of 3)]
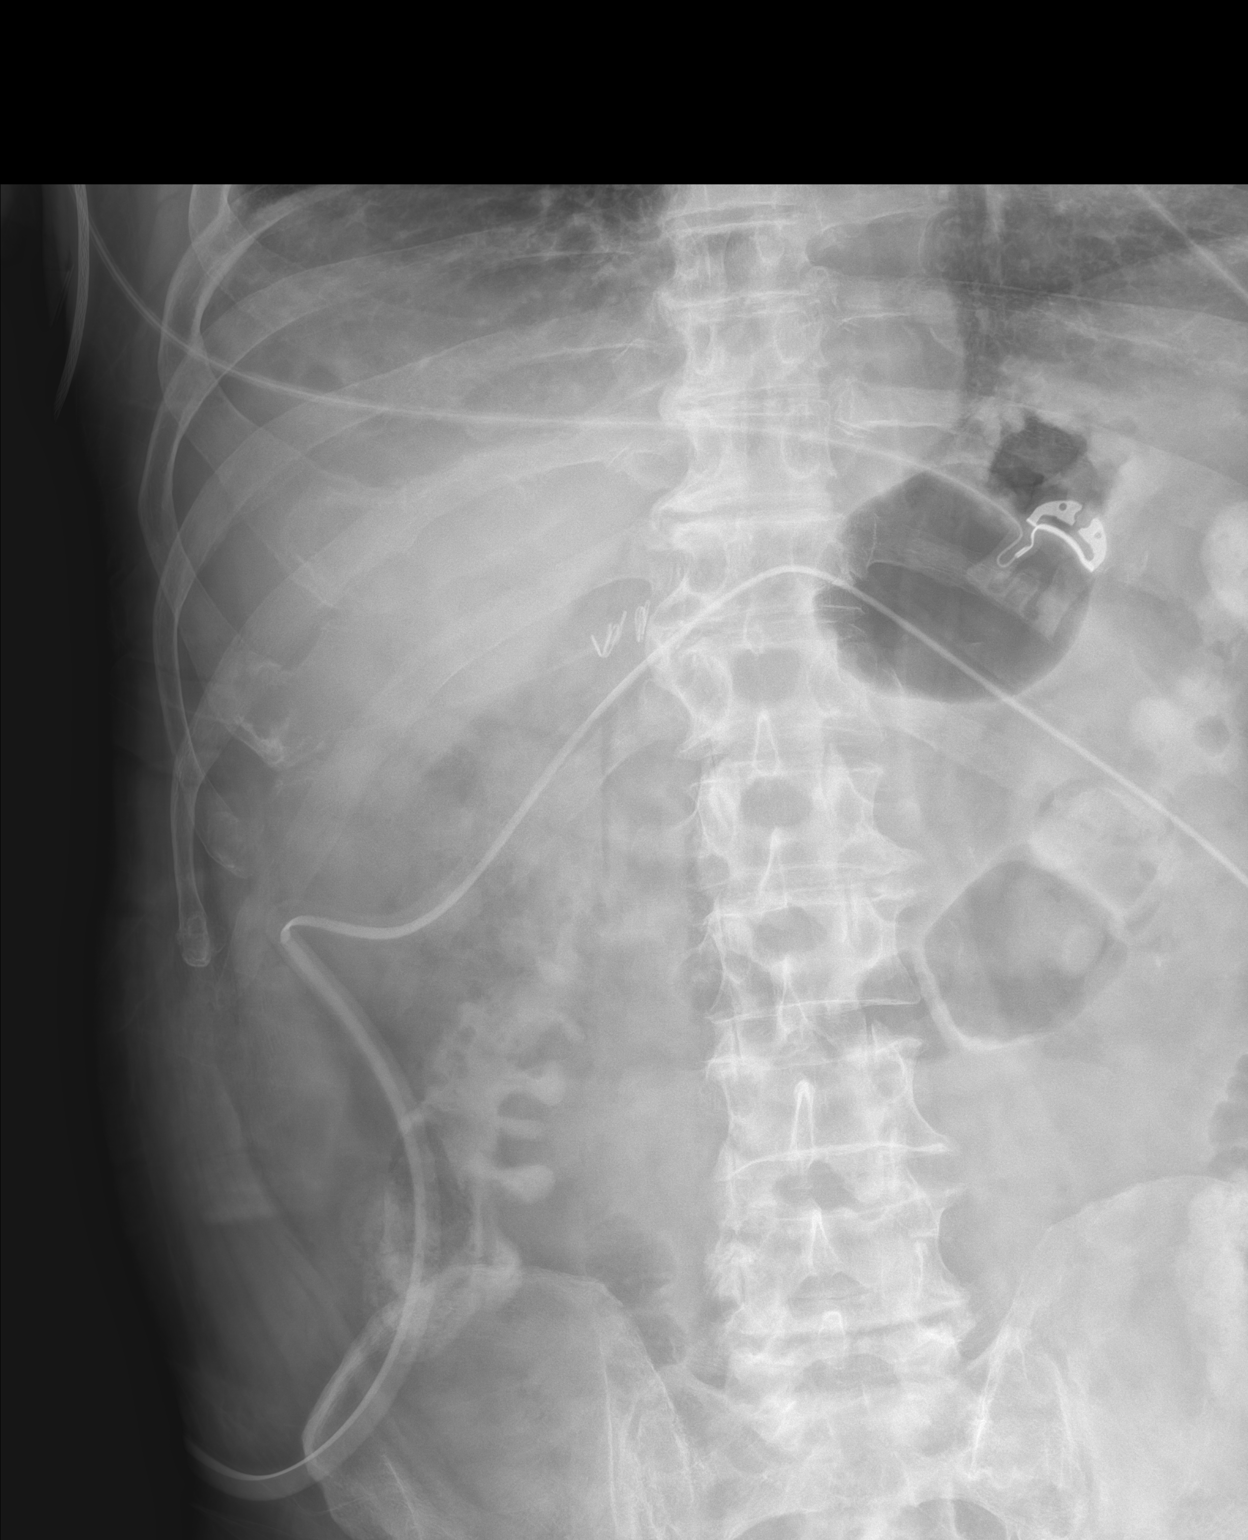

[3 of 3 positions shown; findings below may reference images not displayed]

FINDINGS: No dilated large or small bowel. There is contrast within the colon
which is nondistended. Gas in the rectum.
IMPRESSION: No evidence of bowel obstruction.

## 2020-12-06 DIAGNOSIS — D649 Anemia, unspecified: Secondary | ICD-10-CM | POA: Diagnosis not present

## 2020-12-06 DIAGNOSIS — I480 Paroxysmal atrial fibrillation: Secondary | ICD-10-CM | POA: Diagnosis not present

## 2020-12-06 DIAGNOSIS — Z6832 Body mass index (BMI) 32.0-32.9, adult: Secondary | ICD-10-CM | POA: Diagnosis not present

## 2020-12-06 DIAGNOSIS — I700 Atherosclerosis of aorta: Secondary | ICD-10-CM | POA: Diagnosis not present

## 2020-12-06 DIAGNOSIS — E1165 Type 2 diabetes mellitus with hyperglycemia: Secondary | ICD-10-CM | POA: Diagnosis not present

## 2020-12-06 DIAGNOSIS — E038 Other specified hypothyroidism: Secondary | ICD-10-CM | POA: Diagnosis not present

## 2021-01-10 ENCOUNTER — Other Ambulatory Visit: Payer: Self-pay | Admitting: *Deleted

## 2021-01-10 MED ORDER — AMIODARONE HCL 200 MG PO TABS
200.0000 mg | ORAL_TABLET | Freq: Every day | ORAL | 0 refills | Status: DC
Start: 2021-01-10 — End: 2021-01-10

## 2021-01-10 MED ORDER — AMIODARONE HCL 200 MG PO TABS: 200.0000 mg | ORAL_TABLET | Freq: Every day | ORAL | 0 refills | Status: DC

## 2021-03-05 DIAGNOSIS — D649 Anemia, unspecified: Secondary | ICD-10-CM | POA: Diagnosis not present

## 2021-03-05 DIAGNOSIS — Z6832 Body mass index (BMI) 32.0-32.9, adult: Secondary | ICD-10-CM | POA: Diagnosis not present

## 2021-03-05 DIAGNOSIS — I480 Paroxysmal atrial fibrillation: Secondary | ICD-10-CM | POA: Diagnosis not present

## 2021-03-05 DIAGNOSIS — I700 Atherosclerosis of aorta: Secondary | ICD-10-CM | POA: Diagnosis not present

## 2021-03-05 DIAGNOSIS — E038 Other specified hypothyroidism: Secondary | ICD-10-CM | POA: Diagnosis not present

## 2021-03-05 DIAGNOSIS — Z1331 Encounter for screening for depression: Secondary | ICD-10-CM | POA: Diagnosis not present

## 2021-03-05 DIAGNOSIS — E1165 Type 2 diabetes mellitus with hyperglycemia: Secondary | ICD-10-CM | POA: Diagnosis not present

## 2021-03-05 DIAGNOSIS — Z1389 Encounter for screening for other disorder: Secondary | ICD-10-CM | POA: Diagnosis not present

## 2021-03-23 ENCOUNTER — Other Ambulatory Visit: Payer: Self-pay | Admitting: Student

## 2021-04-08 ENCOUNTER — Other Ambulatory Visit: Payer: Self-pay | Admitting: Student

## 2021-04-12 ENCOUNTER — Telehealth: Payer: Self-pay | Admitting: Student

## 2021-04-12 MED ORDER — AMIODARONE HCL 200 MG PO TABS: ORAL_TABLET | ORAL | 0 refills | Status: DC

## 2021-04-12 NOTE — Telephone Encounter (Signed)
°*  STAT* If patient is at the pharmacy, call can be transferred to refill team.   1. Which medications need to be refilled? (please list name of each medication and dose if known) amiodarone (PACERONE) 200 MG tablet  2. Which pharmacy/location (including street and city if local pharmacy) is medication to be sent to? [place], [place]  3. Do they need a 30 day or 90 day supply? 90 day   Patient has 5 more tablets. He has an appointment 5/2.

## 2021-04-12 NOTE — Telephone Encounter (Signed)
Complete pt has appt for Jun 25, 2021

## 2021-05-22 ENCOUNTER — Other Ambulatory Visit: Payer: Self-pay | Admitting: Student

## 2021-05-23 DIAGNOSIS — E1165 Type 2 diabetes mellitus with hyperglycemia: Secondary | ICD-10-CM | POA: Diagnosis not present

## 2021-05-23 DIAGNOSIS — I509 Heart failure, unspecified: Secondary | ICD-10-CM | POA: Diagnosis not present

## 2021-05-23 DIAGNOSIS — D649 Anemia, unspecified: Secondary | ICD-10-CM | POA: Diagnosis not present

## 2021-05-23 DIAGNOSIS — Z6831 Body mass index (BMI) 31.0-31.9, adult: Secondary | ICD-10-CM | POA: Diagnosis not present

## 2021-05-23 DIAGNOSIS — I480 Paroxysmal atrial fibrillation: Secondary | ICD-10-CM | POA: Diagnosis not present

## 2021-05-23 DIAGNOSIS — E559 Vitamin D deficiency, unspecified: Secondary | ICD-10-CM | POA: Diagnosis not present

## 2021-05-23 DIAGNOSIS — R0602 Shortness of breath: Secondary | ICD-10-CM | POA: Diagnosis not present

## 2021-05-23 DIAGNOSIS — I10 Essential (primary) hypertension: Secondary | ICD-10-CM | POA: Diagnosis not present

## 2021-05-23 DIAGNOSIS — R5383 Other fatigue: Secondary | ICD-10-CM | POA: Diagnosis not present

## 2021-05-24 DIAGNOSIS — R0602 Shortness of breath: Secondary | ICD-10-CM | POA: Diagnosis not present

## 2021-05-24 DIAGNOSIS — N200 Calculus of kidney: Secondary | ICD-10-CM | POA: Diagnosis not present

## 2021-05-24 DIAGNOSIS — K5730 Diverticulosis of large intestine without perforation or abscess without bleeding: Secondary | ICD-10-CM | POA: Diagnosis not present

## 2021-05-24 DIAGNOSIS — I4891 Unspecified atrial fibrillation: Secondary | ICD-10-CM | POA: Diagnosis not present

## 2021-05-24 DIAGNOSIS — Z7901 Long term (current) use of anticoagulants: Secondary | ICD-10-CM | POA: Diagnosis not present

## 2021-05-24 DIAGNOSIS — I517 Cardiomegaly: Secondary | ICD-10-CM | POA: Diagnosis not present

## 2021-05-24 DIAGNOSIS — E119 Type 2 diabetes mellitus without complications: Secondary | ICD-10-CM | POA: Diagnosis not present

## 2021-05-24 DIAGNOSIS — D649 Anemia, unspecified: Secondary | ICD-10-CM | POA: Diagnosis not present

## 2021-05-24 DIAGNOSIS — R002 Palpitations: Secondary | ICD-10-CM | POA: Diagnosis not present

## 2021-05-24 DIAGNOSIS — R590 Localized enlarged lymph nodes: Secondary | ICD-10-CM | POA: Diagnosis not present

## 2021-05-24 DIAGNOSIS — I10 Essential (primary) hypertension: Secondary | ICD-10-CM | POA: Diagnosis not present

## 2021-05-24 DIAGNOSIS — R531 Weakness: Secondary | ICD-10-CM | POA: Diagnosis not present

## 2021-05-24 DIAGNOSIS — D175 Benign lipomatous neoplasm of intra-abdominal organs: Secondary | ICD-10-CM | POA: Diagnosis not present

## 2021-05-24 DIAGNOSIS — Z20822 Contact with and (suspected) exposure to covid-19: Secondary | ICD-10-CM | POA: Diagnosis not present

## 2021-06-03 DIAGNOSIS — E038 Other specified hypothyroidism: Secondary | ICD-10-CM | POA: Diagnosis not present

## 2021-06-03 DIAGNOSIS — I480 Paroxysmal atrial fibrillation: Secondary | ICD-10-CM | POA: Diagnosis not present

## 2021-06-03 DIAGNOSIS — E1165 Type 2 diabetes mellitus with hyperglycemia: Secondary | ICD-10-CM | POA: Diagnosis not present

## 2021-06-03 DIAGNOSIS — Z6831 Body mass index (BMI) 31.0-31.9, adult: Secondary | ICD-10-CM | POA: Diagnosis not present

## 2021-06-03 DIAGNOSIS — I700 Atherosclerosis of aorta: Secondary | ICD-10-CM | POA: Diagnosis not present

## 2021-06-03 DIAGNOSIS — I10 Essential (primary) hypertension: Secondary | ICD-10-CM | POA: Diagnosis not present

## 2021-06-03 DIAGNOSIS — D649 Anemia, unspecified: Secondary | ICD-10-CM | POA: Diagnosis not present

## 2021-06-05 ENCOUNTER — Telehealth: Payer: Self-pay | Admitting: Cardiology

## 2021-06-05 NOTE — Telephone Encounter (Signed)
?*  STAT* If patient is at the pharmacy, call can be transferred to refill team. ? ? ?1. Which medications need to be refilled? (please list name of each medication and dose if known)  ?amiodarone (PACERONE) 200 MG tablet ? ?2. Which pharmacy/location (including street and city if local pharmacy) is medication to be sent to?  ? ?[place], [place] ? ?3. Do they need a 30 day or 90 day supply?  ? ?Patient has a 1 week supply remaining, but will run out prior to 5/02 appointment with Bernerd Pho, NP. He would like enough medication to last him until this appointment if possible. Please assist. ? ?

## 2021-06-06 MED ORDER — AMIODARONE HCL 200 MG PO TABS: ORAL_TABLET | ORAL | 0 refills | Status: DC

## 2021-06-06 NOTE — Telephone Encounter (Signed)
30 day refill sent to [facility], amiodarone 200 mg qd ?

## 2021-06-25 ENCOUNTER — Encounter: Payer: Self-pay | Admitting: Student

## 2021-06-25 ENCOUNTER — Other Ambulatory Visit (HOSPITAL_COMMUNITY)
Admission: RE | Admit: 2021-06-25 | Discharge: 2021-06-25 | Disposition: A | Discharge status: Home / Self Care | Payer: Medicare PPO | Source: Ambulatory Visit | Attending: Student | Admitting: Student

## 2021-06-25 ENCOUNTER — Encounter: Payer: Self-pay | Admitting: *Deleted

## 2021-06-25 ENCOUNTER — Ambulatory Visit (INDEPENDENT_AMBULATORY_CARE_PROVIDER_SITE_OTHER): Payer: Medicare PPO | Admitting: Student

## 2021-06-25 VITALS — BP 136/80 | HR 66 | Ht 67.0 in | Wt 199.2 lb

## 2021-06-25 DIAGNOSIS — R0609 Other forms of dyspnea: Secondary | ICD-10-CM | POA: Insufficient documentation

## 2021-06-25 DIAGNOSIS — I480 Paroxysmal atrial fibrillation: Secondary | ICD-10-CM

## 2021-06-25 DIAGNOSIS — K921 Melena: Secondary | ICD-10-CM | POA: Diagnosis not present

## 2021-06-25 DIAGNOSIS — D649 Anemia, unspecified: Secondary | ICD-10-CM

## 2021-06-25 DIAGNOSIS — I10 Essential (primary) hypertension: Secondary | ICD-10-CM | POA: Diagnosis not present

## 2021-06-25 LAB — BASIC METABOLIC PANEL
Anion gap: 7 (ref 5–15)
BUN: 13 mg/dL (ref 8–23)
CO2: 22 mmol/L (ref 22–32)
Calcium: 9 mg/dL (ref 8.9–10.3)
Chloride: 104 mmol/L (ref 98–111)
Creatinine, Ser: 0.96 mg/dL (ref 0.61–1.24)
GFR, Estimated: >60 mL/min (ref >60)
Glucose, Bld: 163 mg/dL — ABNORMAL HIGH (ref 70–99)
Potassium: 4.2 mmol/L (ref 3.5–5.1)
Sodium: 133 mmol/L — ABNORMAL LOW (ref 135–145)

## 2021-06-25 LAB — CBC
HCT: 29.6 % — ABNORMAL LOW (ref 39.0–52.0)
Hemoglobin: 8.7 g/dL — ABNORMAL LOW (ref 13.0–17.0)
MCH: 32.7 pg (ref 26.0–34.0)
MCHC: 29.4 g/dL — ABNORMAL LOW (ref 30.0–36.0)
MCV: 111.3 fL — ABNORMAL HIGH (ref 80.0–100.0)
Platelets: 255 10*3/uL (ref 150–400)
RBC: 2.66 MIL/uL — ABNORMAL LOW (ref 4.22–5.81)
RDW: 23.5 % — ABNORMAL HIGH (ref 11.5–15.5)
WBC: 11.3 10*3/uL — ABNORMAL HIGH (ref 4.0–10.5)
nRBC: 0.3 % — ABNORMAL HIGH (ref 0.0–0.2)

## 2021-06-25 LAB — BRAIN NATRIURETIC PEPTIDE: B Natriuretic Peptide: 117 pg/mL — ABNORMAL HIGH (ref 0.0–100.0)

## 2021-06-25 MED ORDER — METOPROLOL SUCCINATE ER 100 MG PO TB24: ORAL_TABLET | ORAL | 1 refills | Status: DC

## 2021-06-25 MED ORDER — AMIODARONE HCL 200 MG PO TABS: 200.0000 mg | ORAL_TABLET | Freq: Every day | ORAL | 1 refills | Status: DC

## 2021-06-25 NOTE — Progress Notes (Signed)
? ?Cardiology Office Note   ? ?Date:  06/25/2021  ? ?ID:  Andrew Humphrey, DOB Nov 19, 1940, MRN [id number] ? ?PCP:  Lanelle Bal, PA-C  ?Cardiologist: Rozann Lesches, MD   ? ?Chief Complaint  ?Patient presents with  ? Follow-up  ?  Overdue Visit  ? ? ?History of Present Illness:   ? ?Andrew Humphrey is a 81 y.o. male with past medical history of paroxysmal atrial fibrillation, HTN, and history of GI bleed who presents to the office today for overdue follow-up. ? ?He was last examined by myself in 01/2020 and had recently been hospitalized for cholecystitis. At the time of follow-up, he reported overall doing well and denied any recent anginal symptoms. His Toprol-XL had been reduced for hypotension during admission but given his elevated BP during his office visit, this was titrated to his prior dose of 100 mg daily. ? ?In talking with the patient, his significant other and his daughter today, they report that he was diagnosed with progressive anemia last month as his hemoglobin was at 6.3 and he went to [facility] ED and received 2 units pRBC's and was discharged home. CT Abdomen was performed and showed a hypodense blood pool but no acute findings. Was noted to have diverticulosis along the sigmoid colon without diverticulitis and a stable small right adrenal myelolipoma. His last blood work was over 3 weeks ago and they report he is scheduled to see a Hematologist later this month but has not had any repeat labs in the interim. He does report having melena and has a family history of colon cancer. Denies any hematochezia or hematuria. ? ?He reports still having dyspnea on exertion but denies any associated chest pain or palpitations.  No recent orthopnea, PND or pitting edema. ? ? ?Past Medical History:  ?Diagnosis Date  ? Anemia   ? Essential hypertension   ? History of atrial fibrillation   ? History of GI bleed   ? Type 2 diabetes mellitus ([place])   ? ? ?Past Surgical History:  ?Procedure Laterality Date   ? CHOLECYSTECTOMY N/A 01/04/2020  ? Procedure: LAPAROSCOPIC CHOLECYSTECTOMY;  Surgeon: Coralie Keens, MD;  Location: [place];  Service: General;  Laterality: N/A;  ? COLONOSCOPY N/A 11/09/2016  ? Procedure: COLONOSCOPY;  Surgeon: Danie Binder, MD;  [facility];  Service: Endoscopy;  Laterality: N/A;  ? TESTICLE SURGERY    ? 1977  ? TONSILLECTOMY    ? ? ?Current Medications: ?Outpatient Medications Prior to Visit  ?Medication Sig Dispense Refill  ? acetaminophen (TYLENOL) 325 MG tablet Take 2 tablets (650 mg total) by mouth every 6 (six) hours as needed for mild pain (or Fever >/= 101).    ? amiodarone (PACERONE) 200 MG tablet TAKE 1 TABLET BY MOUTH ONCE DAILY NEEDS  APPOINTMENT  FOR  FURTHER  REFILLS 30 tablet 0  ? amLODipine (NORVASC) 10 MG tablet Take by mouth.    ? apixaban (ELIQUIS) 5 MG TABS tablet Take 1 tablet by mouth twice daily 60 tablet 5  ? Cholecalciferol (VITAMIN D-3) 25 MCG (1000 UT) CAPS Take by mouth.    ? Cyanocobalamin (VITAMIN B 12 PO) Take 1,000 mcg by mouth.    ? ferrous sulfate 325 (65 FE) MG tablet Take 325 mg by mouth daily with breakfast.    ? glipiZIDE (GLUCOTROL XL) 10 MG 24 hr tablet Take 20 mg by mouth daily.    ? metFORMIN (GLUCOPHAGE) 1000 MG tablet Take 1,000 mg by mouth in the morning  and at bedtime.     ? metoprolol succinate (TOPROL-XL) 100 MG 24 hr tablet TAKE 1 TABLET BY MOUTH ONCE DAILY WITH  OR  IMMEDIATELY  FOLLOWING  A  MEAL**PATIENT NEEDS FOLLOW UP APPOINTMENT FOR FURTHER REFILLS** 30 tablet 1  ? omeprazole (PRILOSEC) 40 MG capsule Take 40 mg by mouth daily as needed (indigestion).     ? ?No facility-administered medications prior to visit.  ?  ? ?Allergies:   Patient has no known allergies.  ? ?Social History  ? ?Socioeconomic History  ? Marital status: Single  ?  Spouse name: Not on file  ? Number of children: Not on file  ? Years of education: Not on file  ? Highest education level: Not on file  ?Occupational History  ? Not on file  ?Tobacco Use  ?  Smoking status: Never  ? Smokeless tobacco: Never  ?Vaping Use  ? Vaping Use: Never used  ?Substance and Sexual Activity  ? Alcohol use: Yes  ?  Comment: occasional beer  ? Drug use: No  ? Sexual activity: Not on file  ?Other Topics Concern  ? Not on file  ?Social History Narrative  ? Not on file  ? ?Social Determinants of Health  ? ?Financial Resource Strain: Not on file  ?Food Insecurity: Not on file  ?Transportation Needs: Not on file  ?Physical Activity: Not on file  ?Stress: Not on file  ?Social Connections: Not on file  ?  ? ?Family History:  The patient's family history includes COPD in his sister; Hypertension in his brother and father.  ? ?Review of Systems:   ? ?Please see the history of present illness.    ? ?All other systems reviewed and are otherwise negative except as noted above. ? ? ?Physical Exam:   ? ?VS:  BP 136/80   Pulse 66   Ht '5\' 7"'$  (1.702 m)   Wt 199 lb 3.2 oz (90.4 kg)   SpO2 94%   BMI 31.20 kg/m?    ?General: Well developed, well nourished,male appearing in no acute distress. Appears pale.  ?Head: Normocephalic, atraumatic. ?Neck: No carotid bruits. JVD not elevated.  ?Lungs: Respirations regular and unlabored, without wheezes or rales.  ?Heart: Regular rate and rhythm. No S3 or S4.  2/6 SEM along RUSB.  ?Abdomen: Appears non-distended. No obvious abdominal masses. ?Msk:  Strength and tone appear normal for age. No obvious joint deformities or effusions. ?Extremities: No clubbing or cyanosis. Trace ankle edema bilaterally.  Distal pedal pulses are 2+ bilaterally. ?Neuro: Alert and oriented X 3. Moves all extremities spontaneously. No focal deficits noted. ?Psych:  Responds to questions appropriately with a normal affect. ?Skin: No rashes or lesions noted ? ?Wt Readings from Last 3 Encounters:  ?06/25/21 199 lb 3.2 oz (90.4 kg)  ?02/02/20 178 lb (80.7 kg)  ?01/08/20 192 lb 6.4 oz (87.3 kg)  ?  ? ?Studies/Labs Reviewed:  ? ?EKG:  EKG is ordered today.  The ekg ordered today demonstrates  NSR, HR 66 with RAD and TWI along Lead III.  ? ?Recent Labs: ?06/25/2021: B Natriuretic Peptide 117.0; BUN 13; Creatinine, Ser 0.96; Hemoglobin 8.7; Platelets 255; Potassium 4.2; Sodium 133  ? ?Lipid Panel ?   ?Component Value Date/Time  ? CHOL 150 01/01/2020 2347  ? TRIG 39 01/01/2020 2347  ? HDL 61 01/01/2020 2347  ? CHOLHDL 2.5 01/01/2020 2347  ? VLDL 8 01/01/2020 2347  ? [place] NOT CALCULATED 01/01/2020 2347  ? ? ?Additional studies/ records that were reviewed today  include:  ? ?Echocardiogram: 11/2019 ?IMPRESSIONS  ? ? ? 1. MIld distal septal hypokinesis. . Left ventricular ejection fraction,  ?by estimation, is 55 to 60%. The left ventricle has normal function. The  ?left ventricle demonstrates regional wall motion abnormalities (see  ?scoring diagram/findings for  ?description). There is mild left ventricular hypertrophy. Left ventricular  ?diastolic parameters were normal.  ? 2. Right ventricular systolic function is mildly reduced. The right  ?ventricular size is normal. There is mildly elevated pulmonary artery  ?systolic pressure.  ? 3. Left atrial size was mild to moderately dilated.  ? 4. The mitral valve is abnormal. Moderate mitral valve regurgitation.  ? 5. The aortic valve is abnormal. Aortic valve regurgitation is trivial.  ?Mild to moderate aortic valve sclerosis/calcification is present, without  ?any evidence of aortic stenosis.  ? 6. The inferior vena cava is dilated in size with <50% respiratory  ?variability, suggesting right atrial pressure of 15 mmHg.  ? ?Assessment:   ? ?1. PAF (paroxysmal atrial fibrillation) ([place])   ?2. Essential hypertension   ?3. Dyspnea on exertion   ?4. Anemia, unspecified type   ?5. Melena   ? ? ? ?Plan:  ? ?In order of problems listed above: ? ?1. Paroxysmal Atrial Fibrillation ?- He denies any recent palpitations and is in normal sinus rhythm by examination and EKG today. He remains on Amiodarone 200 mg daily and Toprol-XL 100 mg daily. Will request a copy of most  recent labs from his PCP. If a TSH and LFT's have not been obtained, will need to check given the use of Amiodarone. ?- He is on Eliquis 5 mg twice daily for anticoagulation and will continue for now. If his h

## 2021-06-25 NOTE — Patient Instructions (Signed)
Medication Instructions:  ?Your physician recommends that you continue on your current medications as directed. Please refer to the Current Medication list given to you today. ? ?*If you need a refill on your cardiac medications before your next appointment, please call your pharmacy* ? ? ?Lab Work: ?NONE  ? ?If you have labs (blood work) drawn today and your tests are completely normal, you will receive your results only by: ?MyChart Message (if you have MyChart) OR ?A paper copy in the mail ?If you have any lab test that is abnormal or we need to change your treatment, we will call you to review the results. ? ? ?Testing/Procedures: ?NONE  ? ? ?Follow-Up: ?At [facility], you and your health needs are our priority.  As part of our continuing mission to provide you with exceptional heart care, we have created designated Provider Care Teams.  These Care Teams include your primary Cardiologist (physician) and Advanced Practice Providers (APPs -  Physician Assistants and Nurse Practitioners) who all work together to provide you with the care you need, when you need it. ? ?We recommend signing up for the patient portal called "MyChart".  Sign up information is provided on this After Visit Summary.  MyChart is used to connect with patients for Virtual Visits (Telemedicine).  Patients are able to view lab/test results, encounter notes, upcoming appointments, etc.  Non-urgent messages can be sent to your provider as well.   ?To learn more about what you can do with MyChart, go to [email or web address].   ? ?Your next appointment:   ?6 month(s) ? ?The format for your next appointment:   ?In Person ? ?Provider:   ?You may see Rozann Lesches, MD or one of the following Advanced Practice Providers on your designated Care Team:   ?Bernerd Pho, PA-C  ?Ermalinda Barrios, PA-C Thank you for choosing [facility]! ? ?  ? ? ?Other Instructions ? ? ?Important Information About Sugar ? ? ? ? ? ? ?

## 2021-06-26 ENCOUNTER — Encounter: Payer: Self-pay | Admitting: Internal Medicine

## 2021-06-26 ENCOUNTER — Telehealth: Payer: Self-pay | Admitting: Student

## 2021-06-26 MED ORDER — AMIODARONE HCL 200 MG PO TABS: 100.0000 mg | ORAL_TABLET | Freq: Every day | ORAL | 3 refills | Status: DC

## 2021-06-26 NOTE — Telephone Encounter (Signed)
? ?  Please let the patient know I reviewed the labs from his PCP and his thyroid level was abnormal with TSH at 9.640. Sometimes Amiodarone can impact thyroid function I would recommend reducing his dose to 100 mg daily. He should have a repeat TSH along with Free T3 and T4 in 6-8 weeks for recheck by [place] or his PCP.  ? ?Signed, ?Erma Heritage, PA-C ?06/26/2021, 3:54 PM ? ?

## 2021-06-26 NOTE — Telephone Encounter (Signed)
Pt notified an states that he would prefer to have labs done by PCP  ?

## 2021-07-02 DIAGNOSIS — N200 Calculus of kidney: Secondary | ICD-10-CM | POA: Diagnosis not present

## 2021-07-02 DIAGNOSIS — I10 Essential (primary) hypertension: Secondary | ICD-10-CM | POA: Diagnosis not present

## 2021-07-02 DIAGNOSIS — D649 Anemia, unspecified: Secondary | ICD-10-CM | POA: Diagnosis not present

## 2021-07-02 DIAGNOSIS — D62 Acute posthemorrhagic anemia: Secondary | ICD-10-CM | POA: Diagnosis not present

## 2021-07-02 DIAGNOSIS — D3500 Benign neoplasm of unspecified adrenal gland: Secondary | ICD-10-CM | POA: Diagnosis not present

## 2021-07-02 DIAGNOSIS — Z7984 Long term (current) use of oral hypoglycemic drugs: Secondary | ICD-10-CM | POA: Diagnosis not present

## 2021-07-02 DIAGNOSIS — E119 Type 2 diabetes mellitus without complications: Secondary | ICD-10-CM | POA: Diagnosis not present

## 2021-07-02 DIAGNOSIS — R768 Other specified abnormal immunological findings in serum: Secondary | ICD-10-CM | POA: Diagnosis not present

## 2021-07-02 DIAGNOSIS — K5730 Diverticulosis of large intestine without perforation or abscess without bleeding: Secondary | ICD-10-CM | POA: Diagnosis not present

## 2021-07-02 DIAGNOSIS — Z888 Allergy status to other drugs, medicaments and biological substances status: Secondary | ICD-10-CM | POA: Diagnosis not present

## 2021-07-17 ENCOUNTER — Other Ambulatory Visit: Payer: Self-pay | Admitting: Cardiology

## 2021-07-17 DIAGNOSIS — D62 Acute posthemorrhagic anemia: Secondary | ICD-10-CM | POA: Diagnosis not present

## 2021-07-17 DIAGNOSIS — Z8719 Personal history of other diseases of the digestive system: Secondary | ICD-10-CM | POA: Diagnosis not present

## 2021-07-18 NOTE — Telephone Encounter (Signed)
Prescription refill request for Eliquis received.  Indication: afib  Last office visit: 06/25/2021, afib  Scr: 1.26, 07/02/2021 Age: 81 yo  Weight: 90.4 kg   Pt is on the correct dose of Eliquis. Refill sent.

## 2021-07-23 ENCOUNTER — Inpatient Hospital Stay (HOSPITAL_COMMUNITY)
Admission: EM | Admit: 2021-07-23 | Discharge: 2021-07-26 | DRG: 378 | Disposition: A | Discharge status: Home / Self Care | Payer: Medicare PPO | Attending: Internal Medicine | Admitting: Internal Medicine

## 2021-07-23 ENCOUNTER — Emergency Department (HOSPITAL_COMMUNITY): Payer: Medicare PPO

## 2021-07-23 ENCOUNTER — Other Ambulatory Visit: Payer: Self-pay

## 2021-07-23 DIAGNOSIS — D122 Benign neoplasm of ascending colon: Secondary | ICD-10-CM | POA: Diagnosis present

## 2021-07-23 DIAGNOSIS — R7401 Elevation of levels of liver transaminase levels: Secondary | ICD-10-CM | POA: Diagnosis present

## 2021-07-23 DIAGNOSIS — D539 Nutritional anemia, unspecified: Secondary | ICD-10-CM | POA: Diagnosis present

## 2021-07-23 DIAGNOSIS — K922 Gastrointestinal hemorrhage, unspecified: Secondary | ICD-10-CM | POA: Diagnosis not present

## 2021-07-23 DIAGNOSIS — Z9049 Acquired absence of other specified parts of digestive tract: Secondary | ICD-10-CM

## 2021-07-23 DIAGNOSIS — Z7901 Long term (current) use of anticoagulants: Secondary | ICD-10-CM

## 2021-07-23 DIAGNOSIS — D123 Benign neoplasm of transverse colon: Secondary | ICD-10-CM | POA: Diagnosis present

## 2021-07-23 DIAGNOSIS — Z602 Problems related to living alone: Secondary | ICD-10-CM | POA: Diagnosis present

## 2021-07-23 DIAGNOSIS — K5730 Diverticulosis of large intestine without perforation or abscess without bleeding: Secondary | ICD-10-CM | POA: Diagnosis present

## 2021-07-23 DIAGNOSIS — D62 Acute posthemorrhagic anemia: Secondary | ICD-10-CM | POA: Diagnosis present

## 2021-07-23 DIAGNOSIS — Z800 Family history of malignant neoplasm of digestive organs: Secondary | ICD-10-CM

## 2021-07-23 DIAGNOSIS — K6289 Other specified diseases of anus and rectum: Secondary | ICD-10-CM | POA: Diagnosis not present

## 2021-07-23 DIAGNOSIS — K2970 Gastritis, unspecified, without bleeding: Secondary | ICD-10-CM | POA: Diagnosis present

## 2021-07-23 DIAGNOSIS — H938X1 Other specified disorders of right ear: Secondary | ICD-10-CM | POA: Diagnosis present

## 2021-07-23 DIAGNOSIS — I440 Atrioventricular block, first degree: Secondary | ICD-10-CM | POA: Diagnosis present

## 2021-07-23 DIAGNOSIS — K5790 Diverticulosis of intestine, part unspecified, without perforation or abscess without bleeding: Secondary | ICD-10-CM | POA: Diagnosis not present

## 2021-07-23 DIAGNOSIS — Z7984 Long term (current) use of oral hypoglycemic drugs: Secondary | ICD-10-CM

## 2021-07-23 DIAGNOSIS — K921 Melena: Secondary | ICD-10-CM | POA: Diagnosis present

## 2021-07-23 DIAGNOSIS — D649 Anemia, unspecified: Secondary | ICD-10-CM | POA: Diagnosis present

## 2021-07-23 DIAGNOSIS — D124 Benign neoplasm of descending colon: Secondary | ICD-10-CM | POA: Diagnosis present

## 2021-07-23 DIAGNOSIS — I480 Paroxysmal atrial fibrillation: Secondary | ICD-10-CM | POA: Diagnosis present

## 2021-07-23 DIAGNOSIS — R945 Abnormal results of liver function studies: Secondary | ICD-10-CM | POA: Diagnosis not present

## 2021-07-23 DIAGNOSIS — K635 Polyp of colon: Secondary | ICD-10-CM | POA: Diagnosis not present

## 2021-07-23 DIAGNOSIS — I10 Essential (primary) hypertension: Secondary | ICD-10-CM | POA: Diagnosis present

## 2021-07-23 DIAGNOSIS — D509 Iron deficiency anemia, unspecified: Secondary | ICD-10-CM | POA: Diagnosis present

## 2021-07-23 DIAGNOSIS — Z79899 Other long term (current) drug therapy: Secondary | ICD-10-CM | POA: Diagnosis not present

## 2021-07-23 DIAGNOSIS — R195 Other fecal abnormalities: Secondary | ICD-10-CM

## 2021-07-23 DIAGNOSIS — K449 Diaphragmatic hernia without obstruction or gangrene: Secondary | ICD-10-CM | POA: Diagnosis present

## 2021-07-23 DIAGNOSIS — R0602 Shortness of breath: Secondary | ICD-10-CM | POA: Diagnosis not present

## 2021-07-23 DIAGNOSIS — R0609 Other forms of dyspnea: Secondary | ICD-10-CM | POA: Diagnosis not present

## 2021-07-23 DIAGNOSIS — E119 Type 2 diabetes mellitus without complications: Secondary | ICD-10-CM | POA: Diagnosis present

## 2021-07-23 DIAGNOSIS — I4891 Unspecified atrial fibrillation: Secondary | ICD-10-CM | POA: Diagnosis present

## 2021-07-23 LAB — HEPATIC FUNCTION PANEL
ALT: 64 U/L — ABNORMAL HIGH (ref 0–44)
AST: 82 U/L — ABNORMAL HIGH (ref 15–41)
Albumin: 3.1 g/dL — ABNORMAL LOW (ref 3.5–5.0)
Alkaline Phosphatase: 41 U/L (ref 38–126)
Bilirubin, Direct: 0.2 mg/dL (ref 0.0–0.2)
Indirect Bilirubin: 0.2 mg/dL — ABNORMAL LOW (ref 0.3–0.9)
Total Bilirubin: 0.4 mg/dL (ref 0.3–1.2)
Total Protein: 6.4 g/dL — ABNORMAL LOW (ref 6.5–8.1)

## 2021-07-23 LAB — HEMOGLOBIN A1C
Hgb A1c MFr Bld: 4.8 % (ref 4.8–5.6)
Mean Plasma Glucose: 91.06 mg/dL

## 2021-07-23 LAB — HEMOGLOBIN AND HEMATOCRIT, BLOOD
HCT: 20.9 % — ABNORMAL LOW (ref 39.0–52.0)
Hemoglobin: 6.7 g/dL — CL (ref 13.0–17.0)

## 2021-07-23 LAB — GLUCOSE, CAPILLARY: Glucose-Capillary: 110 mg/dL — ABNORMAL HIGH (ref 70–99)

## 2021-07-23 LAB — RETICULOCYTES
Immature Retic Fract: 40 % — ABNORMAL HIGH (ref 2.3–15.9)
RBC.: 1.99 MIL/uL — ABNORMAL LOW (ref 4.22–5.81)
Retic Count, Absolute: 84.8 10*3/uL (ref 19.0–186.0)
Retic Ct Pct: 4.3 % — ABNORMAL HIGH (ref 0.4–3.1)

## 2021-07-23 LAB — VITAMIN B12: Vitamin B-12: 1007 pg/mL — ABNORMAL HIGH (ref 180–914)

## 2021-07-23 LAB — CBC
HCT: 23.3 % — ABNORMAL LOW (ref 39.0–52.0)
Hemoglobin: 7.2 g/dL — ABNORMAL LOW (ref 13.0–17.0)
MCH: 34.6 pg — ABNORMAL HIGH (ref 26.0–34.0)
MCHC: 30.9 g/dL (ref 30.0–36.0)
MCV: 112 fL — ABNORMAL HIGH (ref 80.0–100.0)
Platelets: 230 10*3/uL (ref 150–400)
RBC: 2.08 MIL/uL — ABNORMAL LOW (ref 4.22–5.81)
RDW: 22.4 % — ABNORMAL HIGH (ref 11.5–15.5)
WBC: 16.4 10*3/uL — ABNORMAL HIGH (ref 4.0–10.5)
nRBC: 1 % — ABNORMAL HIGH (ref 0.0–0.2)

## 2021-07-23 LAB — BASIC METABOLIC PANEL
Anion gap: 12 (ref 5–15)
BUN: 19 mg/dL (ref 8–23)
CO2: 19 mmol/L — ABNORMAL LOW (ref 22–32)
Calcium: 8.9 mg/dL (ref 8.9–10.3)
Chloride: 104 mmol/L (ref 98–111)
Creatinine, Ser: 1.09 mg/dL (ref 0.61–1.24)
GFR, Estimated: >60 mL/min (ref >60)
Glucose, Bld: 193 mg/dL — ABNORMAL HIGH (ref 70–99)
Potassium: 4 mmol/L (ref 3.5–5.1)
Sodium: 135 mmol/L (ref 135–145)

## 2021-07-23 LAB — IRON AND TIBC
Iron: 97 ug/dL (ref 45–182)
Saturation Ratios: 28 % (ref 17.9–39.5)
TIBC: 342 ug/dL (ref 250–450)
UIBC: 245 ug/dL

## 2021-07-23 LAB — BRAIN NATRIURETIC PEPTIDE: B Natriuretic Peptide: 68.3 pg/mL (ref 0.0–100.0)

## 2021-07-23 LAB — POC OCCULT BLOOD, ED: Fecal Occult Bld: POSITIVE — AB

## 2021-07-23 LAB — TROPONIN I (HIGH SENSITIVITY)
Troponin I (High Sensitivity): 6 ng/L (ref <18)
Troponin I (High Sensitivity): 6 ng/L (ref <18)

## 2021-07-23 LAB — FOLATE: Folate: 19.3 ng/mL (ref >5.9)

## 2021-07-23 LAB — FERRITIN: Ferritin: 967 ng/mL — ABNORMAL HIGH (ref 24–336)

## 2021-07-23 LAB — PREPARE RBC (CROSSMATCH)

## 2021-07-23 MED ORDER — AMIODARONE HCL 200 MG PO TABS
100.0000 mg | ORAL_TABLET | Freq: Every day | ORAL | Status: DC
  Administered 2021-07-24 – 2021-07-26 (×2): 100 mg via ORAL
  Filled 2021-07-23 (×2): qty 1

## 2021-07-23 MED ORDER — METOPROLOL SUCCINATE ER 100 MG PO TB24
100.0000 mg | ORAL_TABLET | Freq: Every day | ORAL | Status: DC
  Administered 2021-07-24 – 2021-07-26 (×2): 100 mg via ORAL
  Filled 2021-07-23 (×2): qty 1

## 2021-07-23 MED ORDER — INSULIN ASPART 100 UNIT/ML IJ SOLN
0.0000 [IU] | INTRAMUSCULAR | Status: DC
  Administered 2021-07-23 – 2021-07-24 (×2): 2 [IU] via SUBCUTANEOUS
  Administered 2021-07-24: 3 [IU] via SUBCUTANEOUS
  Administered 2021-07-24: 2 [IU] via SUBCUTANEOUS
  Administered 2021-07-24: 3 [IU] via SUBCUTANEOUS
  Administered 2021-07-25: 2 [IU] via SUBCUTANEOUS
  Administered 2021-07-25: 3 [IU] via SUBCUTANEOUS
  Administered 2021-07-26 (×2): 2 [IU] via SUBCUTANEOUS
  Administered 2021-07-26: 5 [IU] via SUBCUTANEOUS

## 2021-07-23 MED ORDER — ONDANSETRON HCL 4 MG PO TABS: 4.0000 mg | ORAL_TABLET | Freq: Four times a day (QID) | ORAL | Status: DC | PRN

## 2021-07-23 MED ORDER — SODIUM CHLORIDE 0.9% IV SOLUTION: Freq: Once | INTRAVENOUS | Status: AC

## 2021-07-23 MED ORDER — AMLODIPINE BESYLATE 10 MG PO TABS
10.0000 mg | ORAL_TABLET | Freq: Every day | ORAL | Status: DC
  Administered 2021-07-24 – 2021-07-26 (×2): 10 mg via ORAL
  Filled 2021-07-23 (×2): qty 1

## 2021-07-23 MED ORDER — ONDANSETRON HCL 4 MG/2ML IJ SOLN: 4.0000 mg | Freq: Four times a day (QID) | INTRAMUSCULAR | Status: DC | PRN

## 2021-07-23 MED ORDER — PANTOPRAZOLE SODIUM 40 MG IV SOLR
40.0000 mg | Freq: Two times a day (BID) | INTRAVENOUS | Status: DC
  Administered 2021-07-24 (×2): 40 mg via INTRAVENOUS
  Filled 2021-07-23 (×2): qty 10

## 2021-07-23 MED ORDER — ACETAMINOPHEN 325 MG PO TABS: 650.0000 mg | ORAL_TABLET | Freq: Four times a day (QID) | ORAL | Status: DC | PRN

## 2021-07-23 MED ORDER — ACETAMINOPHEN 650 MG RE SUPP: 650.0000 mg | Freq: Four times a day (QID) | RECTAL | Status: DC | PRN

## 2021-07-23 MED ORDER — SODIUM CHLORIDE 0.9 % IV BOLUS
1000.0000 mL | Freq: Once | INTRAVENOUS | Status: AC
  Administered 2021-07-23: 1000 mL via INTRAVENOUS

## 2021-07-23 MED ORDER — PANTOPRAZOLE SODIUM 40 MG IV SOLR
40.0000 mg | Freq: Once | INTRAVENOUS | Status: AC
Start: 2021-07-23 — End: 2021-07-23
  Administered 2021-07-23: 40 mg via INTRAVENOUS
  Filled 2021-07-23: qty 10

## 2021-07-23 MED ORDER — SENNOSIDES-DOCUSATE SODIUM 8.6-50 MG PO TABS: 1.0000 | ORAL_TABLET | Freq: Every evening | ORAL | Status: DC | PRN

## 2021-07-23 NOTE — ED Provider Notes (Signed)
[facility] EMERGENCY DEPARTMENT Provider Note   CSN: [id number] Arrival date & time: 07/23/21  1058     History  Chief Complaint  Patient presents with   Shortness of Breath   Weakness    Andrew Humphrey is a 81 y.o. male with a past medical history of A-fib on Eliquis, diabetes presenting to the ED with a chief complaint of generalized weakness, shortness of breath.  Patient has been having the symptoms for the past several months.  He saw his PCP when symptoms began.  He was told that he could have "some fluid on his lungs" and was given Lasix for 1 week.  Blood work was also drawn and he was found to be anemic around a hemoglobin of 6.  He presented to an outside ER and was given 2 units of blood and discharge.  Subsequently found to have a hemoglobin maintained at 8.  Underwent 1 iron infusion and is on oral iron supplementation as well.  He was also told to follow-up with GI and is scheduled for an appointment on 08/14/2021.  No history of GI bleed in the past.  Last colonoscopy was about 5 years ago and was reassuring.  No hematemesis.  He does not monitor his stools and is unsure if he has had any dark or tarry stools. Denies any chest pain, abdominal pain, vomiting, fever. Reports compliance with his home medications.   Shortness of Breath Associated symptoms: no abdominal pain, no chest pain, no cough, no ear pain, no fever, no rash, no sore throat, no vomiting and no wheezing   Weakness Associated symptoms: shortness of breath   Associated symptoms: no abdominal pain, no chest pain, no cough, no diarrhea, no dizziness, no dysuria, no fever, no myalgias, no nausea, no urgency and no vomiting       Home Medications Prior to Admission medications   Medication Sig Start Date End Date Taking? Authorizing Provider  acetaminophen (TYLENOL) 325 MG tablet Take 2 tablets (650 mg total) by mouth every 6 (six) hours as needed for mild pain (or Fever >/= 101).  01/08/20  Yes Domenic Polite, MD  amiodarone (PACERONE) 200 MG tablet Take 0.5 tablets (100 mg total) by mouth daily. 06/26/21  Yes Strader, [place] M, PA-C  amLODipine (NORVASC) 10 MG tablet Take by mouth. 06/04/21  Yes [provider]  apixaban (ELIQUIS) 5 MG TABS tablet Take 1 tablet by mouth twice daily 07/18/21  Yes Strader, [place] M, PA-C  Cholecalciferol (VITAMIN D-3) 25 MCG (1000 UT) CAPS Take by mouth.   Yes [provider]  Cyanocobalamin (VITAMIN B 12 PO) Take 1,000 mcg by mouth.   Yes [provider]  ferrous sulfate 325 (65 FE) MG tablet Take 325 mg by mouth daily with breakfast.   Yes [provider]  glipiZIDE (GLUCOTROL XL) 10 MG 24 hr tablet Take 20 mg by mouth daily. 11/21/19  Yes [provider]  metFORMIN (GLUCOPHAGE) 1000 MG tablet Take 1,000 mg by mouth in the morning and at bedtime.  06/12/17  Yes [provider]  metoprolol succinate (TOPROL-XL) 100 MG 24 hr tablet TAKE 1 TABLET BY MOUTH ONCE DAILY WITH  OR  IMMEDIATELY  FOLLOWING  A  MEAL 06/25/21  Yes Strader, [place] M, PA-C  omeprazole (PRILOSEC) 40 MG capsule Take 40 mg by mouth daily as needed (indigestion).  Patient not taking: Reported on 07/23/2021 11/21/19   [provider]      Allergies    Patient  has no known allergies.    Review of Systems   Review of Systems  Constitutional:  Positive for fatigue. Negative for appetite change, chills and fever.  HENT:  Negative for ear pain, rhinorrhea, sneezing and sore throat.   Eyes:  Negative for photophobia and visual disturbance.  Respiratory:  Positive for shortness of breath. Negative for cough, chest tightness and wheezing.   Cardiovascular:  Negative for chest pain and palpitations.  Gastrointestinal:  Negative for abdominal pain, blood in stool, constipation, diarrhea, nausea and vomiting.  Genitourinary:  Negative for dysuria, hematuria and urgency.  Musculoskeletal:  Negative for myalgias.  Skin:   Negative for rash.  Neurological:  Negative for dizziness, weakness and light-headedness.   Physical Exam Updated Vital Signs BP 124/69   Pulse 68   Temp 98.3 F (36.8 C) (Oral)   Resp 17   SpO2 100%  Physical Exam Vitals and nursing note reviewed. Exam conducted with a chaperone present.  Constitutional:      General: He is not in acute distress.    Appearance: He is well-developed.  HENT:     Head: Normocephalic and atraumatic.     Nose: Nose normal.  Eyes:     General: No scleral icterus.       Left eye: No discharge.     Conjunctiva/sclera: Conjunctivae normal.  Cardiovascular:     Rate and Rhythm: Normal rate and regular rhythm.     Heart sounds: Normal heart sounds. No murmur heard.   No friction rub. No gallop.  Pulmonary:     Effort: Pulmonary effort is normal. No respiratory distress.     Breath sounds: Normal breath sounds.  Abdominal:     General: Bowel sounds are normal. There is no distension.     Palpations: Abdomen is soft.     Tenderness: There is no abdominal tenderness. There is no guarding.  Genitourinary:    Comments: Stool is black on rectal exam. No tenderness Musculoskeletal:        General: Normal range of motion.     Cervical back: Normal range of motion and neck supple.  Skin:    General: Skin is warm and dry.     Findings: No rash.  Neurological:     Mental Status: He is alert.     Motor: No abnormal muscle tone.     Coordination: Coordination normal.    ED Results / Procedures / Treatments   Labs (all labs ordered are listed, but only abnormal results are displayed) Labs Reviewed  BASIC METABOLIC PANEL - Abnormal; Notable for the following components:      Result Value   CO2 19 (*)    Glucose, Bld 193 (*)    All other components within normal limits  CBC - Abnormal; Notable for the following components:   WBC 16.4 (*)    RBC 2.08 (*)    Hemoglobin 7.2 (*)    HCT 23.3 (*)    MCV 112.0 (*)    MCH 34.6 (*)    RDW 22.4 (*)     nRBC 1.0 (*)    All other components within normal limits  RETICULOCYTES - Abnormal; Notable for the following components:   Retic Ct Pct 4.3 (*)    RBC. 1.99 (*)    Immature Retic Fract 40.0 (*)    All other components within normal limits  POC OCCULT BLOOD, ED - Abnormal; Notable for the following components:   Fecal Occult Bld POSITIVE (*)    All other  components within normal limits  BRAIN NATRIURETIC PEPTIDE  VITAMIN B12  FOLATE  IRON AND TIBC  FERRITIN  TYPE AND SCREEN  TROPONIN I (HIGH SENSITIVITY)  TROPONIN I (HIGH SENSITIVITY)    EKG EKG Interpretation  Date/Time:  Tuesday Jul 23 2021 11:19:08 EDT Ventricular Rate:  68 PR Interval:  210 QRS Duration: 114 QT Interval:  424 QTC Calculation: 450 R Axis:   -30 Text Interpretation: Sinus rhythm with 1st degree A-V block Left axis deviation Abnormal ECG no acute ST/T changes when compared to 2021 Confirmed by Sherwood Gambler [phone]) on 07/23/2021 2:20:28 PM  Radiology DG Chest 2 View  Result Date: 07/23/2021 CLINICAL DATA:  Short of breath EXAM: CHEST - 2 VIEW COMPARISON:  05/24/2021 FINDINGS: Heart size upper normal. Normal vascularity. Prominent lung markings appear chronic. No acute infiltrate or effusion. IMPRESSION: Chronic lung disease.  No superimposed acute abnormality. Electronically Signed   By: Franchot Gallo M.D.   On: 07/23/2021 12:14    Procedures Procedures    Medications Ordered in ED Medications  sodium chloride 0.9 % bolus 1,000 mL (has no administration in time range)    ED Course/ Medical Decision Making/ A&P Clinical Course as of 07/23/21 1707  Tue Jul 23, 2021  1435 Hemoglobin(!): 7.2 Hgb was 9.2 at the beginning of May 2023. [HK]  1436 Fecal Occult Blood, POC(!): POSITIVE [HK]  1706 Spoke to Dr. Hilarie Fredrickson, on-call GI doctor.  Recommends starting PPI bid, holding Eliquis and NPO after midnight. [HK]    Clinical Course User Index [HK] Delia Heady, PA-C                           Medical  Decision Making Amount and/or Complexity of Data Reviewed Labs: ordered. Decision-making details documented in ED Course. Radiology: ordered.  Risk Prescription drug management.   81 year old male with past medical history of A-fib on Eliquis, diabetes, hypertension presenting to the ED with a chief complaint of generalized fatigue, weakness, shortness of breath.  Patient has been having symptoms for the past several months.  Initially told that he had fluid on his lungs, given a diuretic for 1 week.  Also found to be anemic subsequently on CBC and was given 2 units of blood in the ER is discharged.  He has yet to follow-up with GI due to not getting an appointment until next month.  Underwent 1 iron infusion and is on oral iron supplementation as well.  No history of GI bleed that he is aware of.  He does not monitor his stools.  But denies any hematemesis.  On exam abdomen is soft.  His stool was black on rectal exam and is Hemoccult positive.  No external abnormalities.  Chest x-ray is unremarkable.  EKG shows sinus rhythm, no ST or T wave changes from prior tracings.  He does have a leukocytosis of 16, he is anemic with a hemoglobin of 7.2.  His hemoglobin was 9.2 at the beginning of this month.  I also ordered an anemia panel on him.  Troponin is negative.  I suspect he is anemic in the setting of a slow GI bleed.  He will benefit from admission for ongoing management of his anemia as well as further work-up of the bleed.   I have consulted GI who recommends starting PPI twice daily, holding Eliquis and making patient n.p.o. after midnight tonight.    Patient admitted to IMTS who will evaluate the patient prior to administering any  blood products.    Portions of this note were generated with [employer]. Dictation errors may occur despite best attempts at proofreading.         Final Clinical Impression(s) / ED Diagnoses Final diagnoses:  Symptomatic anemia   Gastrointestinal hemorrhage, unspecified gastrointestinal hemorrhage type    Rx / DC Orders ED Discharge Orders     None         Delia Heady, PA-C 07/23/21 1708    Sherwood Gambler, MD 07/24/21 [phone]

## 2021-07-23 NOTE — ED Triage Notes (Signed)
Pt here w family for weakness that has been getting worse x2 months but yesterday pt was unable to get out of bed/ Pt has had increased weakness w/ walking and shob w/ exertion. In April pt has low hemoglobin and was put on lasix x1 week, pt felt better after transfusion but then got worse. Pt had iron transfusion last week, to follow up w/ GI doc at the end of June.

## 2021-07-23 NOTE — ED Notes (Signed)
PA at bedside updating pt on plan of care

## 2021-07-23 NOTE — Progress Notes (Signed)
Date and time results received: 07/23/21 2253 (use smartphrase ".now" to insert current time)  Test: Hemoglobin Critical Value: 6.7  Name of Provider Notified: Buddy Duty DO  Orders Received? Or Actions Taken?: Orders Received - See Orders for details

## 2021-07-23 NOTE — H&P (Cosign Needed Addendum)
Date: 07/23/2021               Patient Name:  Andrew Humphrey MRN: [id number]  DOB: [birth year] Age / Sex: 81 y.o., male   PCP: Lanelle Bal, PA-C         Medical Service: Internal Medicine Teaching Service         Attending Physician: Dr. Lottie Mussel, MD    First Contact: Sabino Dick Pager: [phone]  Second Contact: Dr. Linwood Dibbles Pager: [phone]       After Hours (After 5p/  First Contact Pager: [phone]  weekends / holidays): Second Contact Pager: [phone]   Chief Complaint: Weakness and shortness of breath  History of Present Illness:  Andrew Humphrey is a 81 y.o. male with a history of paroxysmal afib, T2DM, HTN, and anemia requiring 2u pRBCs who presents with worsening fatigue, SOB, and generalized weakness, admitted for concern for GI bleed. He is accompanied by his wife.   Patient states that for the last 2 months he had been experiencing SOB, weakness, and low energy, but fatigue and SOB have worsened in the last week after receiving an IV iron infusion on 5/24. He now gets SOB walking 5 feet and had to be physically assisted when trying to get in the car yesterday. His wife notes that he has been crying lately because of how bad he's felt and his frustration with not knowing the cause. He endorses decreased appetite but denies noticeable weight loss. He also has trouble initiating sleep, although his wife notes that he naps during the daytime. He denies dizziness, feeling cold, craving ice or other non-food items, fever, n/v, abdominal pain, and changes in bowel movements. His nurse noted his BM was black today. Patient states he does not look at his stool at home. He denies seeing blood on toilet paper or his shorts. He states that he temporarily felt better after receiving 2u of pRBCs on 3/31 after labs at PCP revealed Hgb of 7. He also states that he was started on a 7 day course of Lasix in late March after PCP found "fluid on his lungs" which improved symptoms,  but a recent 10 day course of Lasix has not helped.   Colonoscopy performed in 10/2016 after patient noticed bright red blood in rectum. Rectal bleeding attributed to diverticulosis in the recto-sigmoid colon and in the sigmoid colon. Also had one 3 mm polyp in the cecum, removed, three 4 to 5 mm polyps in the mid descending colon, in the proximal transverse colon and in the proximal ascending colon, removed, and external hemorrhoids.  Recently saw Hem/Onc at [facility] on 5/9 for evaluation of his anemia. Uncertain etiology of his anemia, further work-up notable for folate wnl at 15.9, B12 elevated at 998, zinc wnl at 87, elevated retic count of 5.47%, iron low at 35, TIBC 347, saturation 10%, serum free light chain ratio 0.31, total proteins 7.3, IgM wnl at 90, IgG wnl at 1,498, and IgA mildly elevated to 471.8.   On arrival to the ED, patient was normotensive with BP 118/74, HR 68, and satting 100% on RA. Labs notable for WBC 16.4, macrocytic anemia with Hgb 7.2, MCV 112, and positive fecal occult blood test. Troponin 6, 6, BNP 68.3.  Received bolus of IV NS. GI consulted due to concern for GI bleed.   Meds:  Current Meds  Medication Sig   acetaminophen (TYLENOL) 325 MG tablet Take 2 tablets (650 mg total) by mouth every  6 (six) hours as needed for mild pain (or Fever >/= 101).   amiodarone (PACERONE) 200 MG tablet Take 0.5 tablets (100 mg total) by mouth daily.   amLODipine (NORVASC) 10 MG tablet Take by mouth.   apixaban (ELIQUIS) 5 MG TABS tablet Take 1 tablet by mouth twice daily   Cholecalciferol (VITAMIN D-3) 25 MCG (1000 UT) CAPS Take by mouth.   Cyanocobalamin (VITAMIN B 12 PO) Take 1,000 mcg by mouth.   ferrous sulfate 325 (65 FE) MG tablet Take 325 mg by mouth daily with breakfast.   glipiZIDE (GLUCOTROL XL) 10 MG 24 hr tablet Take 20 mg by mouth daily.   metFORMIN (GLUCOPHAGE) 1000 MG tablet Take 1,000 mg by mouth in the morning and at bedtime.    metoprolol succinate (TOPROL-XL)  100 MG 24 hr tablet TAKE 1 TABLET BY MOUTH ONCE DAILY WITH  OR  IMMEDIATELY  FOLLOWING  A  MEAL   Allergies: Allergies as of 07/23/2021   (No Known Allergies)   Past Medical History:  Diagnosis Date   Anemia    Essential hypertension    History of atrial fibrillation    History of GI bleed    Type 2 diabetes mellitus ([place])    Past Surgical History:  Cholecystectomy 12/2019 Testicular surgery in 1997   Family History:  Father died of colon cancer at age 53 Mother with some kind of GYN cancer, received radiation and had hysterectomy  Sister deceased, unknown causes Brother still living, in good health   Social History:  Lives with wife in [place], daughter is RT at [employer] and also lives nearby with 1.5 y.o. grandson. Retired Engineer, building services. Has used chewing tobacco since age 89. Occasionally drinks beer.   Review of Systems: A complete ROS was negative except as per HPI.   Physical Exam: Blood pressure 125/72, pulse 70, temperature 98.3 F (36.8 C), temperature source Oral, resp. rate 12, SpO2 99 %. General: Pale elderly gentleman, in NAD  HEENT: BL conjunctival pallor, LEFT ear with nontender ulcerated papule concerning for basal cell carcinoma (see photo below)  CV: RRR, II/VI systolic murmur, 2+ distal pulses Pulm: Crackles at BL lung bases, normal WOB on room air Abd: Soft, mildly distended, +BS Extremeties: No LE edema Skin: Cool, dry, scattered petechiae and healing scabs present Neuro: A&O x3. Moves all extremities. Normal sensation to gross touch.  Psych: Appropriate behavior and affect      EKG: personally reviewed my interpretation is Sinus Rhythm, 1st degree AV block, LAD   CXR: personally reviewed my interpretation is No acute findings   Assessment & Plan by Problem: Principal Problem:   Symptomatic anemia  Andrew Humphrey is a 81 y.o. male with a history of paroxysmal afib, T2DM, HTN, and anemia requiring 2u pRBCs who presents with worsening fatigue,  SOB, and generalized weakness, admitted for symptomatic anemia likely 2/2 GI bleed.  # Acute blood loss anemia 2/2 GI bleed  #Hx of IDA Patient with prior GI bleed and recent macrocytic anemia presenting with worsening symptomatic anemia with Hgb of 7.2, black stool, and positive fecal occult test. GI bleed likely chronic in nature. Concern for GI malignancy given patient's family history of colon cancer, history of IDA and age. GI was consulted in the ED, patient is npo for procedure tomorrow. Anemia appears to be macrocytic with MCV of 112, B12 elevated and folate wnl, and patient does not regularly drink alcohol. Retic count elevated, macrocytosis could be due to number of large reticulocytes. - GI  consulted, appreciate recs  - Check H&H tonight and repeat CBC in AM  - Transfuse for Hgb < 7 - Hold Eliquis in setting of GI bleed - IV protonix 40 mg BID  # Systolic murmur # HFpEF Patient found to have a systolic murmur and bibasilar rales on exam. No documented history of heart failure but last Echo 11/2019 showed EF 55-60%, RWM abnormalities, mild, LVH, mild PASP, mod MV regurg and mild-mod LA dilation. Patient reports he was told he has a murmur by his doctor but not to worry about it. He was recently prescribed trials of lasix for "fluid on lungs". Patient with bibasilar rales but not fluid overloaded on exam (no LE edema or O2 requirement). Patient likely has progressive HF in the setting of his chronic anemia.  - Repeat Echo - Strict I&Os, daily weight - Continue beta, can start ARB if BP allows - Keep K+ > 4.0, Mg > 2.0 - Tele  # Paroxysmal afib  EKG on admission shows sinus rhythm with 1st degree AV block. Follows with Cardiology in [place], on Eliquis 5 mg BID and amiodarone 10 mg daily. HR stable in the 60s-70s.  - Hold Eliquis as above  - Restart Amiodarone 100 mg and metoprolol 100 after procedure - Tele  # HTN  Normotensive in the ED. Home meds are metoprolol 100 mg and  amlodipine 10 mg daily.  - Restart meds after procedure   # DM Patient takes Metformin 1000 mg and Glipizide 20 mg daily. Hgb A1c 9.9 in Nov 2021, will place patient on SSI and recheck.  - Q4hr SSI - Q4h CBG checks until after procedure - f/u repeat Hgb A1c  # Concern for malignancy of ear Patient notes that lesion on R ear has been present for "a while" and is painless. High concern for malignancy, patient has been instructed to see Dermatology but has not scheduled an appt yet.  - Outpatient Dermatology   IVF: None Diet: NPO for possible procedure in the AM  Code: Full  Prior to Admission Living Arrangement: Home Anticipated Discharge Location: Home Dispo: Admit patient to Observation with expected length of stay less than 2 midnights.  Signed: Stefani Dama, Medical Student 07/23/2021, 4:45 PM  Pager: [phone]  Please contact the on call pager after 5 pm and on weekends at [phone]. After 5pm on weekdays and 1pm on weekends: On Call pager: [phone]  I have reviewed the note by Sabino Dick MS 4 and was present during the interview and physical exam.  I agree with the findings, assessment, and plan.  Lacinda Axon, MD 07/23/2021, 8:46 PM IM Resident, PGY-2 Pager: [phone] Isaiah 41:10

## 2021-07-23 NOTE — ED Notes (Signed)
Chaperoned rectal exam with PA, delivered occult card to lab

## 2021-07-24 ENCOUNTER — Observation Stay (HOSPITAL_COMMUNITY): Payer: Medicare PPO

## 2021-07-24 DIAGNOSIS — Z602 Problems related to living alone: Secondary | ICD-10-CM | POA: Diagnosis present

## 2021-07-24 DIAGNOSIS — D509 Iron deficiency anemia, unspecified: Secondary | ICD-10-CM | POA: Diagnosis present

## 2021-07-24 DIAGNOSIS — K449 Diaphragmatic hernia without obstruction or gangrene: Secondary | ICD-10-CM | POA: Diagnosis present

## 2021-07-24 DIAGNOSIS — I480 Paroxysmal atrial fibrillation: Secondary | ICD-10-CM | POA: Diagnosis present

## 2021-07-24 DIAGNOSIS — E119 Type 2 diabetes mellitus without complications: Secondary | ICD-10-CM | POA: Diagnosis present

## 2021-07-24 DIAGNOSIS — Z79899 Other long term (current) drug therapy: Secondary | ICD-10-CM | POA: Diagnosis not present

## 2021-07-24 DIAGNOSIS — Z9049 Acquired absence of other specified parts of digestive tract: Secondary | ICD-10-CM | POA: Diagnosis not present

## 2021-07-24 DIAGNOSIS — R0609 Other forms of dyspnea: Secondary | ICD-10-CM

## 2021-07-24 DIAGNOSIS — D123 Benign neoplasm of transverse colon: Secondary | ICD-10-CM | POA: Diagnosis present

## 2021-07-24 DIAGNOSIS — K2970 Gastritis, unspecified, without bleeding: Secondary | ICD-10-CM | POA: Diagnosis present

## 2021-07-24 DIAGNOSIS — R195 Other fecal abnormalities: Secondary | ICD-10-CM | POA: Diagnosis not present

## 2021-07-24 DIAGNOSIS — I440 Atrioventricular block, first degree: Secondary | ICD-10-CM | POA: Diagnosis present

## 2021-07-24 DIAGNOSIS — K635 Polyp of colon: Secondary | ICD-10-CM | POA: Diagnosis not present

## 2021-07-24 DIAGNOSIS — D649 Anemia, unspecified: Secondary | ICD-10-CM

## 2021-07-24 DIAGNOSIS — Z7901 Long term (current) use of anticoagulants: Secondary | ICD-10-CM

## 2021-07-24 DIAGNOSIS — D124 Benign neoplasm of descending colon: Secondary | ICD-10-CM | POA: Diagnosis present

## 2021-07-24 DIAGNOSIS — Z800 Family history of malignant neoplasm of digestive organs: Secondary | ICD-10-CM | POA: Diagnosis not present

## 2021-07-24 DIAGNOSIS — D122 Benign neoplasm of ascending colon: Secondary | ICD-10-CM | POA: Diagnosis present

## 2021-07-24 DIAGNOSIS — Z7984 Long term (current) use of oral hypoglycemic drugs: Secondary | ICD-10-CM | POA: Diagnosis not present

## 2021-07-24 DIAGNOSIS — K922 Gastrointestinal hemorrhage, unspecified: Secondary | ICD-10-CM | POA: Diagnosis not present

## 2021-07-24 DIAGNOSIS — D539 Nutritional anemia, unspecified: Secondary | ICD-10-CM | POA: Diagnosis present

## 2021-07-24 DIAGNOSIS — I10 Essential (primary) hypertension: Secondary | ICD-10-CM | POA: Diagnosis present

## 2021-07-24 DIAGNOSIS — D62 Acute posthemorrhagic anemia: Secondary | ICD-10-CM | POA: Diagnosis present

## 2021-07-24 DIAGNOSIS — K5730 Diverticulosis of large intestine without perforation or abscess without bleeding: Secondary | ICD-10-CM | POA: Diagnosis present

## 2021-07-24 DIAGNOSIS — R7401 Elevation of levels of liver transaminase levels: Secondary | ICD-10-CM | POA: Diagnosis present

## 2021-07-24 DIAGNOSIS — K6289 Other specified diseases of anus and rectum: Secondary | ICD-10-CM | POA: Diagnosis not present

## 2021-07-24 DIAGNOSIS — H938X1 Other specified disorders of right ear: Secondary | ICD-10-CM | POA: Diagnosis present

## 2021-07-24 DIAGNOSIS — K921 Melena: Secondary | ICD-10-CM | POA: Diagnosis present

## 2021-07-24 LAB — ECHOCARDIOGRAM COMPLETE
AR max vel: 1.68 cm2
AV Area VTI: 1.66 cm2
AV Area mean vel: 1.66 cm2
AV Mean grad: 12 mmHg
AV Peak grad: 22.1 mmHg
Ao pk vel: 2.35 m/s
Area-P 1/2: 3.15 cm2
Calc EF: 58.3 %
Height: 67 in
S' Lateral: 2.8 cm
Single Plane A2C EF: 56.5 %
Single Plane A4C EF: 58.7 %
Weight: 3125.24 oz

## 2021-07-24 LAB — CBC
HCT: 24 % — ABNORMAL LOW (ref 39.0–52.0)
Hemoglobin: 7.7 g/dL — ABNORMAL LOW (ref 13.0–17.0)
MCH: 33.3 pg (ref 26.0–34.0)
MCHC: 32.1 g/dL (ref 30.0–36.0)
MCV: 103.9 fL — ABNORMAL HIGH (ref 80.0–100.0)
Platelets: 176 10*3/uL (ref 150–400)
RBC: 2.31 MIL/uL — ABNORMAL LOW (ref 4.22–5.81)
RDW: 24.2 % — ABNORMAL HIGH (ref 11.5–15.5)
WBC: 10.9 10*3/uL — ABNORMAL HIGH (ref 4.0–10.5)
nRBC: 1 % — ABNORMAL HIGH (ref 0.0–0.2)

## 2021-07-24 LAB — BASIC METABOLIC PANEL
Anion gap: 8 (ref 5–15)
BUN: 12 mg/dL (ref 8–23)
CO2: 20 mmol/L — ABNORMAL LOW (ref 22–32)
Calcium: 8 mg/dL — ABNORMAL LOW (ref 8.9–10.3)
Chloride: 105 mmol/L (ref 98–111)
Creatinine, Ser: 0.91 mg/dL (ref 0.61–1.24)
GFR, Estimated: >60 mL/min (ref >60)
Glucose, Bld: 114 mg/dL — ABNORMAL HIGH (ref 70–99)
Potassium: 3.8 mmol/L (ref 3.5–5.1)
Sodium: 133 mmol/L — ABNORMAL LOW (ref 135–145)

## 2021-07-24 LAB — BPAM RBC
Blood Product Expiration Date: 202306202359
ISSUE DATE / TIME: 202305302328
Unit Type and Rh: 6200

## 2021-07-24 LAB — GLUCOSE, CAPILLARY
Glucose-Capillary: 101 mg/dL — ABNORMAL HIGH (ref 70–99)
Glucose-Capillary: 117 mg/dL — ABNORMAL HIGH (ref 70–99)
Glucose-Capillary: 124 mg/dL — ABNORMAL HIGH (ref 70–99)
Glucose-Capillary: 126 mg/dL — ABNORMAL HIGH (ref 70–99)
Glucose-Capillary: 127 mg/dL — ABNORMAL HIGH (ref 70–99)
Glucose-Capillary: 158 mg/dL — ABNORMAL HIGH (ref 70–99)
Glucose-Capillary: 167 mg/dL — ABNORMAL HIGH (ref 70–99)

## 2021-07-24 LAB — TYPE AND SCREEN
ABO/RH(D): A POS
Antibody Screen: NEGATIVE
Unit division: 0

## 2021-07-24 LAB — HEMOGLOBIN AND HEMATOCRIT, BLOOD
HCT: 26.8 % — ABNORMAL LOW (ref 39.0–52.0)
HCT: 28.6 % — ABNORMAL LOW (ref 39.0–52.0)
Hemoglobin: 8.4 g/dL — ABNORMAL LOW (ref 13.0–17.0)
Hemoglobin: 9.2 g/dL — ABNORMAL LOW (ref 13.0–17.0)

## 2021-07-24 LAB — MAGNESIUM: Magnesium: 1.8 mg/dL (ref 1.7–2.4)

## 2021-07-24 MED ORDER — METOCLOPRAMIDE HCL 5 MG/ML IJ SOLN
10.0000 mg | Freq: Four times a day (QID) | INTRAMUSCULAR | Status: AC
  Administered 2021-07-24: 10 mg via INTRAVENOUS
  Filled 2021-07-24: qty 2

## 2021-07-24 MED ORDER — PEG-KCL-NACL-NASULF-NA ASC-C 100 G PO SOLR: 1.0000 | Freq: Once | ORAL | Status: DC

## 2021-07-24 MED ORDER — MAGNESIUM SULFATE 2 GM/50ML IV SOLN
2.0000 g | Freq: Once | INTRAVENOUS | Status: AC
  Administered 2021-07-24: 2 g via INTRAVENOUS
  Filled 2021-07-24: qty 50

## 2021-07-24 MED ORDER — PEG-KCL-NACL-NASULF-NA ASC-C 100 G PO SOLR
0.5000 | Freq: Once | ORAL | Status: AC
  Administered 2021-07-24: 100 g via ORAL

## 2021-07-24 MED ORDER — PEG-KCL-NACL-NASULF-NA ASC-C 100 G PO SOLR
0.5000 | Freq: Once | ORAL | Status: AC
  Administered 2021-07-24: 100 g via ORAL
  Filled 2021-07-24: qty 1

## 2021-07-24 NOTE — H&P (View-Only) (Signed)
[facility] Consult: 8:10 AM 07/24/2021  LOS: 0 days    Referring Provider: Dr Despina Hidden  Primary Care Physician:  Lanelle Bal, PA-C Primary Gastroenterologist:  Dr. Barney Drain.       Reason for Consultation:  FOBT +, macrocytic anemia.  Chronic Eliquis.     HPI: Andrew Humphrey is a 81 y.o. male.  NIDDM.  A  fib, on Eliquis (last dose 07/23/21 in AM).  Biliary pancreatitis 12/2019, s/p lap chole.  Inguinal and umbilical hernias w/O complication.   2018 Colonoscopy: For eval hematochezia.  Multiple small, large diverticula in the rectosigmoid and sigmoid.  Polyps (TAs wo HGD) measuring up to 5 mm x 4 resected from cecum, descending, transverse, ascending colon.  Nonbleeding external hemorrhoids.  Experiencing weakness for 2 months.  This progressed to the point yesterday he was unable to get out of bed, had significant dyspnea on exertion.  Anemia dating to > 1 year, Hb 7.4 12/2019 attributed to acute blood loss after laparoscopic cholecystectomy, received 3 PRBCs.  Hgb 7 in 04/2021, transfused 2 PRBCs on 4/1. Parenteral iron infusion on 5/24 orchestrated by [facility].  Upcoming [facility] appointment 6/21.  Macrocytosis.  B12, folate okay a month ago but iron low 35, normal TIBC and sats, ferritin normal 60.Marland Kitchen Appetite good.  Has 1-2 bowel movements daily, does not look to see what color they are but did see black stool for the first time yesterday.  Denies syncope, dizziness, palpitations.  Denies abdominal pain, heartburn, dysphagia, weight loss.  No unusual or excessive bleeding or bruising.  No use of NSAIDs, may take an aspirin infrequently..  Hgb 6.7 .Marland Kitchen  1 PRBC .. 7.7.  MCV 112.  No INR.  FOBT +.   T bili 0.4.  alk phos 41.  AST/ALT 82/64.  Na 133.  Renal fx normal.   Iron studies normal.  B12  elevated.  Folate normal.    Father died in his 53s from colon cancer. Patient is divorced.  Lives alone with his dog in [place].  3 kids, son and daughter nearby.  His daughter works as a Statistician at [employer].  He drinks a beer "on race days".  Which is infrequently.  Not a smoker, does not chew tobacco.  Retired Engineer, building services.    Past Medical History:  Diagnosis Date   Anemia    Essential hypertension    History of atrial fibrillation    History of GI bleed    Type 2 diabetes mellitus ([place])     Past Surgical History:  Procedure Laterality Date   CHOLECYSTECTOMY N/A 01/04/2020   Procedure: LAPAROSCOPIC CHOLECYSTECTOMY;  Surgeon: Coralie Keens, MD;  Location: [place];  Service: General;  Laterality: N/A;   COLONOSCOPY N/A 11/09/2016   Procedure: COLONOSCOPY;  Surgeon: Danie Binder, MD;  [facility];  Service: Endoscopy;  Laterality: N/A;   TESTICLE SURGERY     1977   TONSILLECTOMY      Prior to Admission medications   Medication Sig Start Date End Date Taking? Authorizing Provider  acetaminophen (TYLENOL) 325 MG tablet Take 2 tablets (650 mg total) by mouth every 6 (six) hours as needed for mild pain (or Fever >/= 101). 01/08/20  Yes Joseph, Preetha, MD  amiodarone (PACERONE) 200 MG tablet Take 0.5 tablets (100 mg total) by mouth daily. 06/26/21  Yes Strader, Brittany M, PA-C  amLODipine (NORVASC) 10 MG tablet Take by mouth. 06/04/21  Yes [provider]  apixaban (ELIQUIS) 5 MG TABS tablet Take 1 tablet by mouth twice daily 07/18/21  Yes Strader, Brittany M, PA-C  Cholecalciferol (VITAMIN D-3) 25 MCG (1000 UT) CAPS Take by mouth.   Yes [provider]  Cyanocobalamin (VITAMIN B 12 PO) Take 1,000 mcg by mouth.   Yes [provider]  ferrous sulfate 325 (65 FE) MG tablet Take 325 mg by mouth daily with breakfast.   Yes [provider]  glipiZIDE (GLUCOTROL XL) 10 MG 24 hr tablet Take 20 mg by mouth daily. 11/21/19  Yes  [provider]  metFORMIN (GLUCOPHAGE) 1000 MG tablet Take 1,000 mg by mouth in the morning and at bedtime.  06/12/17  Yes [provider]  metoprolol succinate (TOPROL-XL) 100 MG 24 hr tablet TAKE 1 TABLET BY MOUTH ONCE DAILY WITH  OR  IMMEDIATELY  FOLLOWING  A  MEAL 06/25/21  Yes Strader, Brittany M, PA-C  omeprazole (PRILOSEC) 40 MG capsule Take 40 mg by mouth daily as needed (indigestion).  Patient not taking: Reported on 07/23/2021 11/21/19   [provider]    Scheduled Meds:  amiodarone  100 mg Oral Daily   amLODipine  10 mg Oral Daily   insulin aspart  0-15 Units Subcutaneous Q4H   metoprolol succinate  100 mg Oral Daily   pantoprazole (PROTONIX) IV  40 mg Intravenous Q12H   Infusions:  magnesium sulfate bolus IVPB     PRN Meds: acetaminophen **OR** acetaminophen, ondansetron **OR** ondansetron (ZOFRAN) IV, senna-docusate   Allergies as of 07/23/2021   (No Known Allergies)    Family History  Problem Relation Age of Onset   Hypertension Father    COPD Sister    Hypertension Brother    Colon cancer Neg Hx    Colon polyps Neg Hx     Social History   Socioeconomic History   Marital status: Single    Spouse name: Not on file   Number of children: Not on file   Years of education: Not on file   Highest education level: Not on file  Occupational History   Not on file  Tobacco Use   Smoking status: Never   Smokeless tobacco: Never  Vaping Use   Vaping Use: Never used  Substance and Sexual Activity   Alcohol use: Yes    Comment: occasional beer   Drug use: No   Sexual activity: Not on file  Other Topics Concern   Not on file  Social History Narrative   Not on file   Social Determinants of Health   Financial Resource Strain: Not on file  Food Insecurity: Not on file  Transportation Needs: Not on file  Physical Activity: Not on file  Stress: Not on file  Social Connections: Not on file  Intimate Partner Violence: Not on file     REVIEW OF SYSTEMS: Constitutional: See HPI. ENT:  No nose bleeds Pulm: See HPI. CV:  No palpitations, no LE edema.  No angina. GU:  No hematuria, no frequency GI: See HPI. Heme: See HPI. Transfusions: See HPI. Neuro:  No headaches, no peripheral tingling or   numbness.  No dizziness.  No syncope, no seizures. Derm: A briar thorn stuck in his right ear a couple of weeks ago and there is been a sore at that area ever since despite him removing the thorn.  He has upcoming appointment with a dermatologist for evaluation. Endocrine:  No sweats or chills.  No polyuria or dysuria.  Home glucose monitoring with sugars generally in the low to mid 100s. Immunization: Not queried. Travel: Not queried.   PHYSICAL EXAM: Vital signs in last 24 hours: Vitals:   07/23/21 2349 07/24/21 0258  BP: 126/63 115/61  Pulse: 69 67  Resp: 16 18  Temp: 98.4 F (36.9 C) 97.6 F (36.4 C)  SpO2: 97% 96%   Wt Readings from Last 3 Encounters:  07/24/21 88.6 kg  06/25/21 90.4 kg  02/02/20 80.7 kg    General: Pleasant, pale but well-appearing otherwise.  Comfortable.  Able to provide good history. Head: No facial asymmetry or swelling.  No signs of head trauma. Eyes: Conjunctiva pale Ears: Scabbed sore on upper right ear which is not bleeding. Nose: No discharge or congestion Mouth: Full dentures.  Mucosa moist, clear, pink.  Tongue midline Neck: No JVD, no masses, no thyromegaly Lungs: Clear bilaterally.  No labored breathing.  Good breath sounds. Heart: RRR.  No MRG.  S1, S2 present Abdomen: Soft without tenderness or distention.  Active bowel sounds.  No HSM, masses, bruits, hernias..   Rectal: Rectal exam deferred.  This was performed by the PA in the ED yesterday who noted black stool which tested FOBT positive Musc/Skeltl: No joint redness, swelling or gross deformity. Extremities: No CCE. Neurologic: Alert and oriented x3.  Moves all 4 limbs without tremor or gross weakness though formal  strength testing not performed. Skin: No telangiectasia, no rash.  Sore on right ear upper helix Tattoos:  none observed.  Nodes: No cervical adenopathy Psych: Cooperative.  NAD.  Intake/Output from previous day: 05/30 0701 - 05/31 0700 In: 451.2 [I.V.:47.2; Blood:404] Out: 550 [Urine:550] Intake/Output this shift: Total I/O In: -  Out: 250 [Urine:250]  LAB RESULTS: Recent Labs    07/23/21 1144 07/23/21 2227 07/24/21 0619  WBC 16.4*  --  10.9*  HGB 7.2* 6.7* 7.7*  HCT 23.3* 20.9* 24.0*  PLT 230  --  176   BMET Lab Results  Component Value Date   NA 133 (L) 07/24/2021   NA 135 07/23/2021   NA 133 (L) 06/25/2021   K 3.8 07/24/2021   K 4.0 07/23/2021   K 4.2 06/25/2021   CL 105 07/24/2021   CL 104 07/23/2021   CL 104 06/25/2021   CO2 20 (L) 07/24/2021   CO2 19 (L) 07/23/2021   CO2 22 06/25/2021   GLUCOSE 114 (H) 07/24/2021   GLUCOSE 193 (H) 07/23/2021   GLUCOSE 163 (H) 06/25/2021   BUN 12 07/24/2021   BUN 19 07/23/2021   BUN 13 06/25/2021   CREATININE 0.91 07/24/2021   CREATININE 1.09 07/23/2021   CREATININE 0.96 06/25/2021   CALCIUM 8.0 (L) 07/24/2021   CALCIUM 8.9 07/23/2021   CALCIUM 9.0 06/25/2021   LFT Recent Labs    07/23/21 2227  PROT 6.4*  ALBUMIN 3.1*  AST 82*  ALT 64*  ALKPHOS 41  BILITOT 0.4  BILIDIR 0.2  IBILI 0.2*   PT/INR Lab Results  Component Value Date   INR 1.12 11/08/2016   Hepatitis Panel No results for input(s): HEPBSAG, HCVAB, HEPAIGM, HEPBIGM in the last 72 hours. C-Diff No components found for:  CDIFF Lipase     Component Value Date/Time   LIPASE 33 01/05/2020 0218    Drugs of Abuse  No results found for: LABOPIA, COCAINSCRNUR, LABBENZ, AMPHETMU, THCU, LABBARB   RADIOLOGY STUDIES: DG Chest 2 View  Result Date: 07/23/2021 CLINICAL DATA:  Short of breath EXAM: CHEST - 2 VIEW COMPARISON:  05/24/2021 FINDINGS: Heart size upper normal. Normal vascularity. Prominent lung markings appear chronic. No acute infiltrate  or effusion. IMPRESSION: Chronic lung disease.  No superimposed acute abnormality. Electronically Signed   By: Franchot Gallo M.D.   On: 07/23/2021 12:14      IMPRESSION:     Acute on chronic macrocytic anemia.  Received 1 PRBC overnight with adequate bump of Hgb.  Received 1 unit PRBC in March and infusion parenteral iron last week.  Black, FOBT positive stool.  Eliquis for history A-fib.  Last dose was the morning of 5/30.    Adenomatous colon polyps on colonoscopy 2018  Elevated transaminases.  Patient does not drink.  Lap chole and biliary pancreatitis 12/2019    PLAN:      Allow clear liquids.  Case discussed with Dr. Hilarie Fredrickson.  Plan EGD and colonoscopy tomorrow.  Begin clear liquids now see orders regarding bowel prep.  Patient agreeable to proceed "the sooner the better".    For now will keep Protonix in place though could switch to po.      ? Obtain ultrasound abdomen for eval of the LFTs?    Azucena Freed  07/24/2021, 8:10 AM Phone [phone]

## 2021-07-24 NOTE — Progress Notes (Signed)
  Transition of Care [facility]) Screening Note   Patient Details  Name: Andrew Humphrey Date of Birth: 1940/03/31   Transition of Care [facility]) CM/SW Contact:    Tom-Johnson, Renea Ee, RN Phone Number: 07/24/2021, 3:14 PM  Patient is admitted for Symptomatic Anemia. Hgb on admission was 6.7. 2 unit PRBC given, now at 8.4. On Eliquis for A-fib.  From home alone. Has a supportive daughter. Independent with care and drive self prior to admission. Has a cane and walker at home.  PCP is Lanelle Bal, PA-C and uses [facility] in [place].   Transition of Care Department [facility]) has reviewed patient and no TOC needs or recommendations have been identified at this time. TOC will continue to monitor patient advancement through interdisciplinary progression rounds. If new patient transition needs arise, please place a TOC consult.

## 2021-07-24 NOTE — Progress Notes (Addendum)
Hospital day: 1  Subjective:   Overnight events: Hgb 6.7 ~10 PM, s/p 1u pRBCs, Hgb improved to 7.7 this AM.   Patient seen at bedside during morning rounds. He reports significant improvement in his SOB and fatigue compared to yesterday. He notes that he had another "coal colored" BM this morning.   Objective:  Vital signs in last 24 hours: Vitals:   07/23/21 2348 07/23/21 2349 07/24/21 0258 07/24/21 0415  BP: 126/63 126/63 115/61   Pulse: 69 69 67   Resp: 16 16 18    Temp: 98.4 F (36.9 C) 98.4 F (36.9 C) 97.6 F (36.4 C)   TempSrc: Oral Oral Oral   SpO2: 97% 97% 96%   Weight:    88.6 kg  Height:        Filed Weights   07/23/21 2137 07/24/21 0415  Weight: 88.9 kg 88.6 kg     Intake/Output Summary (Last 24 hours) at 07/24/2021 0732 Last data filed at 07/24/2021 0300 Gross per 24 hour  Intake 451.17 ml  Output 550 ml  Net -98.83 ml   Net IO Since Admission: -98.83 mL [07/24/21 0732]   Pertinent Labs:    Latest Ref Rng & Units 07/24/2021    6:19 AM 07/23/2021   10:27 PM 07/23/2021   11:44 AM  CBC  WBC 4.0 - 10.5 K/uL 10.9    16.4    Hemoglobin 13.0 - 17.0 g/dL 7.7   6.7   7.2    Hematocrit 39.0 - 52.0 % 24.0   20.9   23.3    Platelets 150 - 400 K/uL 176    230         Latest Ref Rng & Units 07/24/2021    6:19 AM 07/23/2021   10:27 PM 07/23/2021   11:44 AM  CMP  Glucose 70 - 99 mg/dL 114    193    BUN 8 - 23 mg/dL 12    19    Creatinine 0.61 - 1.24 mg/dL 0.91    1.09    Sodium 135 - 145 mmol/L 133    135    Potassium 3.5 - 5.1 mmol/L 3.8    4.0    Chloride 98 - 111 mmol/L 105    104    CO2 22 - 32 mmol/L 20    19    Calcium 8.9 - 10.3 mg/dL 8.0    8.9    Total Protein 6.5 - 8.1 g/dL  6.4     Total Bilirubin 0.3 - 1.2 mg/dL  0.4     Alkaline Phos 38 - 126 U/L  41     AST 15 - 41 U/L  82     ALT 0 - 44 U/L  64       Recent Labs    07/23/21 2330 07/24/21 0351 07/24/21 0728  GLUCAP 124* 127* 117*     Imaging: DG Chest 2 View  Result Date:  07/23/2021 CLINICAL DATA:  Short of breath EXAM: CHEST - 2 VIEW COMPARISON:  05/24/2021 FINDINGS: Heart size upper normal. Normal vascularity. Prominent lung markings appear chronic. No acute infiltrate or effusion. IMPRESSION: Chronic lung disease.  No superimposed acute abnormality. Electronically Signed   By: Franchot Gallo M.D.   On: 07/23/2021 12:14    Physical Exam  General: Pale, pleasant male in NAD HEENT: Conjunctivae pale, lesion present on R external ear in helix area  CV: II/VI systolic murmur, regular rate and rhythm, distal pulses intact  Pulmonary: Bibasilar crackles present,  normal WOB on RA Abdominal: Soft, nontender and nondistended. +BS Extremities: No LE edema  Skin: Warm, dry, several petechiae and healing scabs present  Neuro: A&O x3 Psych: Appropriate behavior and affect   Assessment/Plan: Andrew Humphrey is a 81 y.o. male with a history of paroxysmal afib, T2DM, HTN, and anemia requiring 2u pRBCs who presents with worsening fatigue, SOB, and generalized weakness, admitted for symptomatic anemia likely 2/2 GI bleed.  Principal Problem:   Symptomatic anemia   # Acute blood loss anemia 2/2 GI bleed  # Hx of IDA Patient with hx of GI bleed and recently identified macrocytic anemia presenting with worsening symptomatic anemia with Hgb of 7.2, black stool, and positive fecal occult test. Suspect GI bleed, likely chronic in nature. Concern for GI malignancy given patient's family history of colon cancer, history of IDA, and age. GI was consulted in the ED, EGD and colonoscopy scheduled for 9:30 AM on 6/1. Required 1u pRBCs after Hgb decreased to 6.7 last night. Rechecking H&H this morning.  - GI consulted, appreciate recs  - f/u H&H  - Transfuse for Hgb < 7 - Hold Eliquis in setting of GI bleed - IV protonix 40 mg BID  # Elevated liver enzymes  AST 82, ALT 64, tbili and alk phos wnl. Patient with history of acute biliary pancreatitis s/p cholecystectomy in 12/2019.  Will repeat CMP tomorrow and if AST/ALT are persistently elevated, will consider RUQ ultrasound.  - Repeat CMP in AM   # Systolic murmur # HFpEF Patient with systolic murmur and bibasilar rales on exam. No documented history of heart failure, Echo from 11/2019 showed EF 55-60%, RWM abnormalities, mild, LVH, mild PASP, mod MV regurg and mild-mod LA dilation. He was recently prescribed trial of lasix for "fluid on lungs".  Mg 1.8, K 3.8, will replete this morning. Echo scheduled for 11 AM this morning. - f/u Echo results - Strict I&Os, daily weight - Continue metoprolol succinate 100 mg, can start ARB if BP allows - Keep K > 4.0, Mg > 2.0, supplement with po K and IV Mg - Telemetry   # Paroxysmal afib  EKG on admission shows sinus rhythm with 1st degree AV block. Follows with Cardiology in [place], on Eliquis 5 mg BID and amiodarone 100 mg daily. HR stable in the 60s-70s.  - Hold Eliquis as above  - Continue Amiodarone 100 mg and metoprolol succ 100 mg - Telemetry    # HTN  Normotensive in the ED. Home meds are metoprolol succ 100 mg and amlodipine 10 mg daily.  - Continue metoprolol succ 100 mg and amlodipine 10 mg   # DM Patient takes Metformin 1000 mg and Glipizide 20 mg daily. Hgb A1c 9.9 in Nov 2021, will place patient on SSI. Hgb A1c 4.8 on recheck, likely inaccurate in the setting of chronic GI bleed.  - Q4hr SSI - Q4h CBG checks    # Concern for malignancy of ear Patient notes that lesion on R ear has been present for "a while" and is painless. High concern for malignancy, patient states that he has scheduled Derm appt.  - Outpatient Dermatology   Diet: Clear liquids, npo at midnight for procedure IVF: None VTE: SCDs  CODE: Full  Prior to Admission Living Arrangement: Home Anticipated Discharge Location: Home Barriers to Discharge: Medical stability  Dispo: Admit to inpatient with anticipated stay > 2 midnights  Signed: Sabino Dick, MS4 Internal Medicine  Teaching Service [phone] Please contact the on call pager after 5 pm  and on weekends at [phone].

## 2021-07-24 NOTE — Plan of Care (Signed)
  Problem: Activity: Goal: Risk for activity intolerance will decrease Outcome: Progressing   Problem: Nutrition: Goal: Adequate nutrition will be maintained Outcome: Progressing   Problem: Coping: Goal: Level of anxiety will decrease Outcome: Progressing   Problem: Elimination: Goal: Will not experience complications related to urinary retention Outcome: Progressing   

## 2021-07-24 NOTE — Hospital Course (Signed)
Andrew Humphrey is a 81 y.o. male with past history of paroxysmal afib, HTN, and T2DM who was admitted for symptomatic anemia 2/2 to GI bleed. Hospital course by problem is outlined below.   ED Course On arrival to the ED, patient was normotensive with BP 118/74, HR 68, and satting 100% on RA. Labs notable for WBC 16.4, macrocytic anemia with Hgb 7.2, MCV 112, and positive fecal occult blood test. Troponin 6, 6, BNP 68.3.  Received bolus of IV NS. GI consulted due to concern for GI bleed.    Acute blood loss anemia 2/2 GI bleed Received 1u pRBCs after Hgb dropped to 6.7. GI performed EGD and colonoscopy to evaluate heme positive stool and anemia. EGD with gastritis, biopsied, and 3 cm hiatal hernia, no evidence of active or recent bleeding. Colonoscopy findings included three 3 to 6 mm polyps,moderate diverticulosis in the sigmoid colon, and submucosal nodule in the distal rectum, no ulceration or evidence of bleeding. GI recommended VRE ***.   Afib  Patient's Eliquis 5 mg BID was held in the setting of GI bleed. Amiodarone 100 mg and metoprolol 100 mg daily were continued.  Concern for HFpEF, resolved Patient with SOB, systolic murmur, and bibasilar crackles on lung exam. Echo obtained, showed LVEF 60-65% and grade I diastolic dysfunction, no findings warranting Cardiology referral.   Elevated liver enzymes  Liver enzymes mildly elevated AST ***, ALT ***. HCV labs ***, RUQ [place] ***.   Ear lesion concerning for malignancy ***  HTN  Patient was continued on home amlodipine 10 mg. He remained normotensive during his admission.   DM Patient was placed on SSI.

## 2021-07-24 NOTE — Consult Note (Addendum)
[facility] Consult: 8:10 AM 07/24/2021  LOS: 0 days    Referring Provider: Dr Despina Hidden  Primary Care Physician:  Lanelle Bal, PA-C Primary Gastroenterologist:  Dr. Barney Drain.       Reason for Consultation:  FOBT +, macrocytic anemia.  Chronic Eliquis.     HPI: Andrew Humphrey is a 81 y.o. male.  NIDDM.  A  fib, on Eliquis (last dose 07/23/21 in AM).  Biliary pancreatitis 12/2019, s/p lap chole.  Inguinal and umbilical hernias w/O complication.   2018 Colonoscopy: For eval hematochezia.  Multiple small, large diverticula in the rectosigmoid and sigmoid.  Polyps (TAs wo HGD) measuring up to 5 mm x 4 resected from cecum, descending, transverse, ascending colon.  Nonbleeding external hemorrhoids.  Experiencing weakness for 2 months.  This progressed to the point yesterday he was unable to get out of bed, had significant dyspnea on exertion.  Anemia dating to > 1 year, Hb 7.4 12/2019 attributed to acute blood loss after laparoscopic cholecystectomy, received 3 PRBCs.  Hgb 7 in 04/2021, transfused 2 PRBCs on 4/1. Parenteral iron infusion on 5/24 orchestrated by [facility].  Upcoming [facility] appointment 6/21.  Macrocytosis.  B12, folate okay a month ago but iron low 35, normal TIBC and sats, ferritin normal 60.Marland Kitchen Appetite good.  Has 1-2 bowel movements daily, does not look to see what color they are but did see black stool for the first time yesterday.  Denies syncope, dizziness, palpitations.  Denies abdominal pain, heartburn, dysphagia, weight loss.  No unusual or excessive bleeding or bruising.  No use of NSAIDs, may take an aspirin infrequently..  Hgb 6.7 .Marland Kitchen  1 PRBC .. 7.7.  MCV 112.  No INR.  FOBT +.   T bili 0.4.  alk phos 41.  AST/ALT 82/64.  Na 133.  Renal fx normal.   Iron studies normal.  B12  elevated.  Folate normal.    Father died in his 53s from colon cancer. Patient is divorced.  Lives alone with his dog in [place].  3 kids, son and daughter nearby.  His daughter works as a Statistician at [employer].  He drinks a beer "on race days".  Which is infrequently.  Not a smoker, does not chew tobacco.  Retired Engineer, building services.    Past Medical History:  Diagnosis Date   Anemia    Essential hypertension    History of atrial fibrillation    History of GI bleed    Type 2 diabetes mellitus ([place])     Past Surgical History:  Procedure Laterality Date   CHOLECYSTECTOMY N/A 01/04/2020   Procedure: LAPAROSCOPIC CHOLECYSTECTOMY;  Surgeon: Coralie Keens, MD;  Location: [place];  Service: General;  Laterality: N/A;   COLONOSCOPY N/A 11/09/2016   Procedure: COLONOSCOPY;  Surgeon: Danie Binder, MD;  [facility];  Service: Endoscopy;  Laterality: N/A;   TESTICLE SURGERY     1977   TONSILLECTOMY      Prior to Admission medications   Medication Sig Start Date End Date Taking? Authorizing Provider  acetaminophen (TYLENOL) 325 MG tablet Take 2 tablets (650 mg total) by mouth every 6 (six) hours as needed for mild pain (or Fever >/= 101). 01/08/20  Yes Domenic Polite, MD  amiodarone (PACERONE) 200 MG tablet Take 0.5 tablets (100 mg total) by mouth daily. 06/26/21  Yes Strader, [place] M, PA-C  amLODipine (NORVASC) 10 MG tablet Take by mouth. 06/04/21  Yes [provider]  apixaban (ELIQUIS) 5 MG TABS tablet Take 1 tablet by mouth twice daily 07/18/21  Yes Strader, [place] M, PA-C  Cholecalciferol (VITAMIN D-3) 25 MCG (1000 UT) CAPS Take by mouth.   Yes [provider]  Cyanocobalamin (VITAMIN B 12 PO) Take 1,000 mcg by mouth.   Yes [provider]  ferrous sulfate 325 (65 FE) MG tablet Take 325 mg by mouth daily with breakfast.   Yes [provider]  glipiZIDE (GLUCOTROL XL) 10 MG 24 hr tablet Take 20 mg by mouth daily. 11/21/19  Yes  [provider]  metFORMIN (GLUCOPHAGE) 1000 MG tablet Take 1,000 mg by mouth in the morning and at bedtime.  06/12/17  Yes [provider]  metoprolol succinate (TOPROL-XL) 100 MG 24 hr tablet TAKE 1 TABLET BY MOUTH ONCE DAILY WITH  OR  IMMEDIATELY  FOLLOWING  A  MEAL 06/25/21  Yes Strader, [place] M, PA-C  omeprazole (PRILOSEC) 40 MG capsule Take 40 mg by mouth daily as needed (indigestion).  Patient not taking: Reported on 07/23/2021 11/21/19   [provider]    Scheduled Meds:  amiodarone  100 mg Oral Daily   amLODipine  10 mg Oral Daily   insulin aspart  0-15 Units Subcutaneous Q4H   metoprolol succinate  100 mg Oral Daily   pantoprazole (PROTONIX) IV  40 mg Intravenous Q12H   Infusions:  magnesium sulfate bolus IVPB     PRN Meds: acetaminophen **OR** acetaminophen, ondansetron **OR** ondansetron (ZOFRAN) IV, senna-docusate   Allergies as of 07/23/2021   (No Known Allergies)    Family History  Problem Relation Age of Onset   Hypertension Father    COPD Sister    Hypertension Brother    Colon cancer Neg Hx    Colon polyps Neg Hx     Social History   Socioeconomic History   Marital status: Single    Spouse name: Not on file   Number of children: Not on file   Years of education: Not on file   Highest education level: Not on file  Occupational History   Not on file  Tobacco Use   Smoking status: Never   Smokeless tobacco: Never  Vaping Use   Vaping Use: Never used  Substance and Sexual Activity   Alcohol use: Yes    Comment: occasional beer   Drug use: No   Sexual activity: Not on file  Other Topics Concern   Not on file  Social History Narrative   Not on file   Social Determinants of Health   Financial Resource Strain: Not on file  Food Insecurity: Not on file  Transportation Needs: Not on file  Physical Activity: Not on file  Stress: Not on file  Social Connections: Not on file  Intimate Partner Violence: Not on file     REVIEW OF SYSTEMS: Constitutional: See HPI. ENT:  No nose bleeds Pulm: See HPI. CV:  No palpitations, no LE edema.  No angina. GU:  No hematuria, no frequency GI: See HPI. Heme: See HPI. Transfusions: See HPI. Neuro:  No headaches, no peripheral tingling or  numbness.  No dizziness.  No syncope, no seizures. Derm: A briar thorn stuck in his right ear a couple of weeks ago and there is been a sore at that area ever since despite him removing the thorn.  He has upcoming appointment with a dermatologist for evaluation. Endocrine:  No sweats or chills.  No polyuria or dysuria.  Home glucose monitoring with sugars generally in the low to mid 100s. Immunization: Not queried. Travel: Not queried.   PHYSICAL EXAM: Vital signs in last 24 hours: Vitals:   07/23/21 2349 07/24/21 0258  BP: 126/63 115/61  Pulse: 69 67  Resp: 16 18  Temp: 98.4 F (36.9 C) 97.6 F (36.4 C)  SpO2: 97% 96%   Wt Readings from Last 3 Encounters:  07/24/21 88.6 kg  06/25/21 90.4 kg  02/02/20 80.7 kg    General: Pleasant, pale but well-appearing otherwise.  Comfortable.  Able to provide good history. Head: No facial asymmetry or swelling.  No signs of head trauma. Eyes: Conjunctiva pale Ears: Scabbed sore on upper right ear which is not bleeding. Nose: No discharge or congestion Mouth: Full dentures.  Mucosa moist, clear, pink.  Tongue midline Neck: No JVD, no masses, no thyromegaly Lungs: Clear bilaterally.  No labored breathing.  Good breath sounds. Heart: RRR.  No MRG.  S1, S2 present Abdomen: Soft without tenderness or distention.  Active bowel sounds.  No HSM, masses, bruits, hernias..   Rectal: Rectal exam deferred.  This was performed by the PA in the ED yesterday who noted black stool which tested FOBT positive Musc/Skeltl: No joint redness, swelling or gross deformity. Extremities: No CCE. Neurologic: Alert and oriented x3.  Moves all 4 limbs without tremor or gross weakness though formal  strength testing not performed. Skin: No telangiectasia, no rash.  Sore on right ear upper helix Tattoos:  none observed.  Nodes: No cervical adenopathy Psych: Cooperative.  NAD.  Intake/Output from previous day: 05/30 0701 - 05/31 0700 In: 451.2 [I.V.:47.2; Blood:404] Out: 550 [Urine:550] Intake/Output this shift: Total I/O In: -  Out: 250 [Urine:250]  LAB RESULTS: Recent Labs    07/23/21 1144 07/23/21 2227 07/24/21 0619  WBC 16.4*  --  10.9*  HGB 7.2* 6.7* 7.7*  HCT 23.3* 20.9* 24.0*  PLT 230  --  176   BMET Lab Results  Component Value Date   NA 133 (L) 07/24/2021   NA 135 07/23/2021   NA 133 (L) 06/25/2021   K 3.8 07/24/2021   K 4.0 07/23/2021   K 4.2 06/25/2021   CL 105 07/24/2021   CL 104 07/23/2021   CL 104 06/25/2021   CO2 20 (L) 07/24/2021   CO2 19 (L) 07/23/2021   CO2 22 06/25/2021   GLUCOSE 114 (H) 07/24/2021   GLUCOSE 193 (H) 07/23/2021   GLUCOSE 163 (H) 06/25/2021   BUN 12 07/24/2021   BUN 19 07/23/2021   BUN 13 06/25/2021   CREATININE 0.91 07/24/2021   CREATININE 1.09 07/23/2021   CREATININE 0.96 06/25/2021   CALCIUM 8.0 (L) 07/24/2021   CALCIUM 8.9 07/23/2021   CALCIUM 9.0 06/25/2021   LFT Recent Labs    07/23/21 2227  PROT 6.4*  ALBUMIN 3.1*  AST 82*  ALT 64*  ALKPHOS 41  BILITOT 0.4  BILIDIR 0.2  IBILI 0.2*   PT/INR Lab Results  Component Value Date   INR 1.12 11/08/2016   Hepatitis Panel No results for input(s): HEPBSAG, HCVAB, HEPAIGM, HEPBIGM in the last 72 hours. C-Diff No components found for:  CDIFF Lipase     Component Value Date/Time   LIPASE 33 01/05/2020 0218    Drugs of Abuse  No results found for: LABOPIA, COCAINSCRNUR, LABBENZ, AMPHETMU, THCU, LABBARB   RADIOLOGY STUDIES: DG Chest 2 View  Result Date: 07/23/2021 CLINICAL DATA:  Short of breath EXAM: CHEST - 2 VIEW COMPARISON:  05/24/2021 FINDINGS: Heart size upper normal. Normal vascularity. Prominent lung markings appear chronic. No acute infiltrate  or effusion. IMPRESSION: Chronic lung disease.  No superimposed acute abnormality. Electronically Signed   By: Franchot Gallo M.D.   On: 07/23/2021 12:14      IMPRESSION:     Acute on chronic macrocytic anemia.  Received 1 PRBC overnight with adequate bump of Hgb.  Received 1 unit PRBC in March and infusion parenteral iron last week.  Black, FOBT positive stool.  Eliquis for history A-fib.  Last dose was the morning of 5/30.    Adenomatous colon polyps on colonoscopy 2018  Elevated transaminases.  Patient does not drink.  Lap chole and biliary pancreatitis 12/2019    PLAN:      Allow clear liquids.  Case discussed with Dr. Hilarie Fredrickson.  Plan EGD and colonoscopy tomorrow.  Begin clear liquids now see orders regarding bowel prep.  Patient agreeable to proceed "the sooner the better".    For now will keep Protonix in place though could switch to po.      ? Obtain ultrasound abdomen for eval of the LFTs?    Azucena Freed  07/24/2021, 8:10 AM Phone [phone]

## 2021-07-25 ENCOUNTER — Encounter: Payer: Self-pay | Admitting: Internal Medicine

## 2021-07-25 ENCOUNTER — Inpatient Hospital Stay (HOSPITAL_COMMUNITY): Payer: Medicare PPO | Admitting: Anesthesiology

## 2021-07-25 ENCOUNTER — Encounter (HOSPITAL_COMMUNITY): Payer: Self-pay | Admitting: Internal Medicine

## 2021-07-25 ENCOUNTER — Encounter (HOSPITAL_COMMUNITY)
Admission: EM | Disposition: A | Discharge status: Home / Self Care | Payer: Self-pay | Source: Home / Self Care | Attending: Internal Medicine

## 2021-07-25 DIAGNOSIS — K2970 Gastritis, unspecified, without bleeding: Secondary | ICD-10-CM

## 2021-07-25 DIAGNOSIS — D122 Benign neoplasm of ascending colon: Secondary | ICD-10-CM

## 2021-07-25 DIAGNOSIS — K6289 Other specified diseases of anus and rectum: Secondary | ICD-10-CM | POA: Diagnosis not present

## 2021-07-25 DIAGNOSIS — R195 Other fecal abnormalities: Secondary | ICD-10-CM

## 2021-07-25 DIAGNOSIS — K635 Polyp of colon: Secondary | ICD-10-CM | POA: Diagnosis not present

## 2021-07-25 DIAGNOSIS — D123 Benign neoplasm of transverse colon: Secondary | ICD-10-CM

## 2021-07-25 DIAGNOSIS — D124 Benign neoplasm of descending colon: Secondary | ICD-10-CM

## 2021-07-25 DIAGNOSIS — K449 Diaphragmatic hernia without obstruction or gangrene: Secondary | ICD-10-CM | POA: Diagnosis not present

## 2021-07-25 HISTORY — PX: COLONOSCOPY WITH PROPOFOL: SHX5780

## 2021-07-25 HISTORY — PX: BIOPSY: SHX5522

## 2021-07-25 HISTORY — PX: POLYPECTOMY: SHX5525

## 2021-07-25 HISTORY — PX: ESOPHAGOGASTRODUODENOSCOPY (EGD) WITH PROPOFOL: SHX5813

## 2021-07-25 HISTORY — PX: GIVENS CAPSULE STUDY: SHX5432

## 2021-07-25 LAB — COMPREHENSIVE METABOLIC PANEL
ALT: 73 U/L — ABNORMAL HIGH (ref 0–44)
AST: 95 U/L — ABNORMAL HIGH (ref 15–41)
Albumin: 3.1 g/dL — ABNORMAL LOW (ref 3.5–5.0)
Alkaline Phosphatase: 47 U/L (ref 38–126)
Anion gap: 8 (ref 5–15)
BUN: 7 mg/dL — ABNORMAL LOW (ref 8–23)
CO2: 17 mmol/L — ABNORMAL LOW (ref 22–32)
Calcium: 7.9 mg/dL — ABNORMAL LOW (ref 8.9–10.3)
Chloride: 111 mmol/L (ref 98–111)
Creatinine, Ser: 0.95 mg/dL (ref 0.61–1.24)
GFR, Estimated: >60 mL/min (ref >60)
Glucose, Bld: 118 mg/dL — ABNORMAL HIGH (ref 70–99)
Potassium: 3.7 mmol/L (ref 3.5–5.1)
Sodium: 136 mmol/L (ref 135–145)
Total Bilirubin: 0.9 mg/dL (ref 0.3–1.2)
Total Protein: 6.2 g/dL — ABNORMAL LOW (ref 6.5–8.1)

## 2021-07-25 LAB — CBC
HCT: 24.3 % — ABNORMAL LOW (ref 39.0–52.0)
Hemoglobin: 7.7 g/dL — ABNORMAL LOW (ref 13.0–17.0)
MCH: 33.6 pg (ref 26.0–34.0)
MCHC: 31.7 g/dL (ref 30.0–36.0)
MCV: 106.1 fL — ABNORMAL HIGH (ref 80.0–100.0)
Platelets: 178 10*3/uL (ref 150–400)
RBC: 2.29 MIL/uL — ABNORMAL LOW (ref 4.22–5.81)
RDW: 24 % — ABNORMAL HIGH (ref 11.5–15.5)
WBC: 10.6 10*3/uL — ABNORMAL HIGH (ref 4.0–10.5)
nRBC: 1 % — ABNORMAL HIGH (ref 0.0–0.2)

## 2021-07-25 LAB — GLUCOSE, CAPILLARY
Glucose-Capillary: 147 mg/dL — ABNORMAL HIGH (ref 70–99)
Glucose-Capillary: 117 mg/dL — ABNORMAL HIGH (ref 70–99)
Glucose-Capillary: 131 mg/dL — ABNORMAL HIGH (ref 70–99)
Glucose-Capillary: 167 mg/dL — ABNORMAL HIGH (ref 70–99)

## 2021-07-25 LAB — HEMOGLOBIN AND HEMATOCRIT, BLOOD
HCT: 26.2 % — ABNORMAL LOW (ref 39.0–52.0)
Hemoglobin: 8.3 g/dL — ABNORMAL LOW (ref 13.0–17.0)

## 2021-07-25 SURGERY — ESOPHAGOGASTRODUODENOSCOPY (EGD) WITH PROPOFOL
Anesthesia: Monitor Anesthesia Care

## 2021-07-25 MED ORDER — PROPOFOL 500 MG/50ML IV EMUL
INTRAVENOUS | Status: DC | PRN
  Administered 2021-07-25: 100 ug/kg/min via INTRAVENOUS

## 2021-07-25 MED ORDER — LIDOCAINE 2% (20 MG/ML) 5 ML SYRINGE
INTRAMUSCULAR | Status: DC | PRN
  Administered 2021-07-25: 60 mg via INTRAVENOUS

## 2021-07-25 MED ORDER — PANTOPRAZOLE SODIUM 40 MG PO TBEC
40.0000 mg | DELAYED_RELEASE_TABLET | Freq: Every day | ORAL | Status: DC
  Administered 2021-07-26: 40 mg via ORAL
  Filled 2021-07-25: qty 1

## 2021-07-25 MED ORDER — PHENYLEPHRINE 80 MCG/ML (10ML) SYRINGE FOR IV PUSH (FOR BLOOD PRESSURE SUPPORT)
PREFILLED_SYRINGE | INTRAVENOUS | Status: DC | PRN
  Administered 2021-07-25: 80 ug via INTRAVENOUS

## 2021-07-25 MED ORDER — METOCLOPRAMIDE HCL 5 MG/ML IJ SOLN
10.0000 mg | Freq: Four times a day (QID) | INTRAMUSCULAR | Status: AC
  Administered 2021-07-25 (×2): 10 mg via INTRAVENOUS
  Filled 2021-07-25 (×2): qty 2

## 2021-07-25 MED ORDER — LACTATED RINGERS IV SOLN: INTRAVENOUS | Status: DC | PRN

## 2021-07-25 SURGICAL SUPPLY — 25 items

## 2021-07-25 NOTE — Interval H&P Note (Signed)
History and Physical Interval Note: For EGD/colon today to eval heme + dark stools and acute blood loss anemia. Eliquis remains on hold.  The nature of the procedure, as well as the risks, benefits, and alternatives were carefully and thoroughly reviewed with the patient. Ample time for discussion and questions allowed. The patient understood, was satisfied, and agreed to proceed.      Latest Ref Rng & Units 07/25/2021    2:37 AM 07/24/2021    7:28 PM 07/24/2021   12:33 PM  CBC  WBC 4.0 - 10.5 K/uL 10.6      Hemoglobin 13.0 - 17.0 g/dL 7.7   9.2   8.4    Hematocrit 39.0 - 52.0 % 24.3   28.6   26.8    Platelets 150 - 400 K/uL 178         07/25/2021 9:59 AM  Andrew Humphrey  has presented today for surgery, with the diagnosis of Anemia.  FOBT positive black stool.  Chronic Eliquis.  History adenomatous colon polyps 2018.  The various methods of treatment have been discussed with the patient and family. After consideration of risks, benefits and other options for treatment, the patient has consented to  Procedure(s): ESOPHAGOGASTRODUODENOSCOPY (EGD) WITH PROPOFOL (N/A) COLONOSCOPY WITH PROPOFOL (N/A) as a surgical intervention.  The patient's history has been reviewed, patient examined, no change in status, stable for surgery.  I have reviewed the patient's chart and labs.  Questions were answered to the patient's satisfaction.     [name] [name]

## 2021-07-25 NOTE — Transfer of Care (Signed)
Immediate Anesthesia Transfer of Care Note  Patient: Andrew Humphrey  Procedure(s) Performed: ESOPHAGOGASTRODUODENOSCOPY (EGD) WITH PROPOFOL COLONOSCOPY WITH PROPOFOL BIOPSY POLYPECTOMY  Patient Location: PACU  Anesthesia Type:MAC  Level of Consciousness: awake, alert  and patient cooperative  Airway & Oxygen Therapy: Patient Spontanous Breathing  Post-op Assessment: Report given to RN, Post -op Vital signs reviewed and stable and Patient moving all extremities X 4  Post vital signs: Reviewed and stable  Last Vitals:  Vitals Value Taken Time  BP 111/68 07/25/21 1109  Temp    Pulse 65 07/25/21 1109  Resp 14 07/25/21 1109  SpO2 99 % 07/25/21 1109  Vitals shown include unvalidated device data.  Last Pain:  Vitals:   07/25/21 0800  TempSrc: Temporal  PainSc: 0-No pain         Complications: No notable events documented.

## 2021-07-25 NOTE — Anesthesia Procedure Notes (Signed)
Procedure Name: MAC Date/Time: 07/25/2021 10:17 AM Performed by: Kyung Rudd, CRNA Pre-anesthesia Checklist: Patient identified, Emergency Drugs available, Suction available and Patient being monitored Patient Re-evaluated:Patient Re-evaluated prior to induction Oxygen Delivery Method: Nasal cannula Induction Type: IV induction Placement Confirmation: positive ETCO2 Dental Injury: Teeth and Oropharynx as per pre-operative assessment

## 2021-07-25 NOTE — Progress Notes (Addendum)
Hospital day: 2  Subjective:   Overnight events: No acute events.   Patient seen at bedside during morning rounds. Daughter is present. Will reports continued improvement in SOB/fatigue. Would like to go home.   Objective:  Vital signs in last 24 hours: Vitals:   07/24/21 1751 07/24/21 2048 07/25/21 0419 07/25/21 0800  BP: 129/65 123/73 (!) 116/55 114/64  Pulse: 72 67 69 71  Resp: '17 18 18 16  '$ Temp: 97.8 F (36.6 C) 98.1 F (36.7 C) 97.9 F (36.6 C) 97.8 F (36.6 C)  TempSrc: Oral Oral Oral Temporal  SpO2: 100% 99% 99% 97%  Weight:   87.3 kg 87.3 kg  Height:    '5\' 7"'$  (1.702 m)    Filed Weights   07/24/21 0415 07/25/21 0419 07/25/21 0800  Weight: 88.6 kg 87.3 kg 87.3 kg     Intake/Output Summary (Last 24 hours) at 07/25/2021 0927 Last data filed at 07/25/2021 0811 Gross per 24 hour  Intake 50 ml  Output 605 ml  Net -555 ml   Net IO Since Admission: -903.83 mL [07/25/21 0927]   Pertinent Labs:    Latest Ref Rng & Units 07/25/2021    2:37 AM 07/24/2021    7:28 PM 07/24/2021   12:33 PM  CBC  WBC 4.0 - 10.5 K/uL 10.6      Hemoglobin 13.0 - 17.0 g/dL 7.7   9.2   8.4    Hematocrit 39.0 - 52.0 % 24.3   28.6   26.8    Platelets 150 - 400 K/uL 178           Latest Ref Rng & Units 07/25/2021    2:37 AM 07/24/2021    6:19 AM 07/23/2021   10:27 PM  CMP  Glucose 70 - 99 mg/dL 118   114     BUN 8 - 23 mg/dL 7   12     Creatinine 0.61 - 1.24 mg/dL 0.95   0.91     Sodium 135 - 145 mmol/L 136   133     Potassium 3.5 - 5.1 mmol/L 3.7   3.8     Chloride 98 - 111 mmol/L 111   105     CO2 22 - 32 mmol/L 17   20     Calcium 8.9 - 10.3 mg/dL 7.9   8.0     Total Protein 6.5 - 8.1 g/dL 6.2    6.4    Total Bilirubin 0.3 - 1.2 mg/dL 0.9    0.4    Alkaline Phos 38 - 126 U/L 47    41    AST 15 - 41 U/L 95    82    ALT 0 - 44 U/L 73    64      Recent Labs    07/24/21 2357 07/25/21 0419 07/25/21 0741  GLUCAP 101* 147* 117*     Imaging: ECHOCARDIOGRAM  COMPLETE  Result Date: 07/24/2021    ECHOCARDIOGRAM REPORT   Patient Name:   CAEDEN FOOTS Date of Exam: 07/24/2021 Medical Rec #:  [id number]         Height:       67.0 in Accession #:    [id number]        Weight:       195.3 lb Date of Birth:  1940/11/03         BSA:          2.002 m Patient Age:    64 years  BP:           115/61 mmHg Patient Gender: M                 HR:           69 bpm. Exam Location:  Inpatient Procedure: 2D Echo, Cardiac Doppler and Color Doppler Indications:    Dyspnea  History:        Patient has prior history of Echocardiogram examinations.                 Arrythmias:Atrial Fibrillation; Risk Factors:Hypertension and                 Diabetes.  Sonographer:    Jyl Heinz Referring Phys: [id number] [place]  1. Left ventricular ejection fraction, by estimation, is 60 to 65%. The left ventricle has normal function. The left ventricle has no regional wall motion abnormalities. There is mild concentric left ventricular hypertrophy. Left ventricular diastolic parameters are consistent with Grade I diastolic dysfunction (impaired relaxation). Elevated left ventricular end-diastolic pressure.  2. Right ventricular systolic function is normal. The right ventricular size is normal. There is normal pulmonary artery systolic pressure.  3. Left atrial size was moderately dilated.  4. The mitral valve is normal in structure. Trivial mitral valve regurgitation. No evidence of mitral stenosis. Moderate mitral annular calcification.  5. The aortic valve is tricuspid. There is mild calcification of the aortic valve. There is mild thickening of the aortic valve. Aortic valve regurgitation is mild. Mild aortic valve stenosis. Aortic valve area, by VTI measures 1.66 cm. Aortic valve mean gradient measures 12.0 mmHg. Aortic valve Vmax measures 2.35 m/s.  6. Aortic dilatation noted. There is mild dilatation of the aortic root, measuring 36 mm.  7. The inferior vena cava is normal in  size with greater than 50% respiratory variability, suggesting right atrial pressure of 3 mmHg. FINDINGS  Left Ventricle: Left ventricular ejection fraction, by estimation, is 60 to 65%. The left ventricle has normal function. The left ventricle has no regional wall motion abnormalities. The left ventricular internal cavity size was normal in size. There is  mild concentric left ventricular hypertrophy. Left ventricular diastolic parameters are consistent with Grade I diastolic dysfunction (impaired relaxation). Elevated left ventricular end-diastolic pressure. Right Ventricle: The right ventricular size is normal. No increase in right ventricular wall thickness. Right ventricular systolic function is normal. There is normal pulmonary artery systolic pressure. The tricuspid regurgitant velocity is 1.81 m/s, and  with an assumed right atrial pressure of 15 mmHg, the estimated right ventricular systolic pressure is 60.6 mmHg. Left Atrium: Left atrial size was moderately dilated. Right Atrium: Right atrial size was normal in size. Pericardium: There is no evidence of pericardial effusion. Mitral Valve: The mitral valve is normal in structure. Moderate mitral annular calcification. Trivial mitral valve regurgitation. No evidence of mitral valve stenosis. Tricuspid Valve: The tricuspid valve is normal in structure. Tricuspid valve regurgitation is trivial. No evidence of tricuspid stenosis. Aortic Valve: The aortic valve is tricuspid. There is mild calcification of the aortic valve. There is mild thickening of the aortic valve. Aortic valve regurgitation is mild. Mild aortic stenosis is present. Aortic valve mean gradient measures 12.0 mmHg. Aortic valve peak gradient measures 22.1 mmHg. Aortic valve area, by VTI measures 1.66 cm. Pulmonic Valve: The pulmonic valve was normal in structure. Pulmonic valve regurgitation is mild. No evidence of pulmonic stenosis. Aorta: Aortic dilatation noted. There is mild dilatation of  the aortic root, measuring  36 mm. Venous: The inferior vena cava is normal in size with greater than 50% respiratory variability, suggesting right atrial pressure of 3 mmHg. IAS/Shunts: No atrial level shunt detected by color flow Doppler.  LEFT VENTRICLE PLAX 2D LVIDd:         4.90 cm      Diastology LVIDs:         2.80 cm      LV e' medial:    4.57 cm/s LV PW:         1.30 cm      LV E/e' medial:  21.4 LV IVS:        1.20 cm      LV e' lateral:   6.42 cm/s LVOT diam:     2.00 cm      LV E/e' lateral: 15.2 LV SV:         88 LV SV Index:   44 LVOT Area:     3.14 cm  LV Volumes (MOD) LV vol d, MOD A2C: 121.0 ml LV vol d, MOD A4C: 127.0 ml LV vol s, MOD A2C: 52.6 ml LV vol s, MOD A4C: 52.4 ml LV SV MOD A2C:     68.4 ml LV SV MOD A4C:     127.0 ml LV SV MOD BP:      73.6 ml RIGHT VENTRICLE            IVC RV Basal diam:  3.00 cm    IVC diam: 2.10 cm RV Mid diam:    2.70 cm RV S prime:     8.81 cm/s TAPSE (M-mode): 2.3 cm LEFT ATRIUM             Index        RIGHT ATRIUM           Index LA diam:        5.10 cm 2.55 cm/m   RA Area:     11.50 cm LA Vol (A2C):   63.7 ml 31.82 ml/m  RA Volume:   22.40 ml  11.19 ml/m LA Vol (A4C):   69.9 ml 34.91 ml/m LA Biplane Vol: 69.6 ml 34.76 ml/m  AORTIC VALVE AV Area (Vmax):    1.68 cm AV Area (Vmean):   1.66 cm AV Area (VTI):     1.66 cm AV Vmax:           235.00 cm/s AV Vmean:          162.667 cm/s AV VTI:            0.527 m AV Peak Grad:      22.1 mmHg AV Mean Grad:      12.0 mmHg LVOT Vmax:         126.00 cm/s LVOT Vmean:        85.900 cm/s LVOT VTI:          0.279 m LVOT/AV VTI ratio: 0.53  AORTA Ao Root diam: 3.60 cm Ao Asc diam:  3.50 cm MITRAL VALVE                TRICUSPID VALVE MV Area (PHT): 3.15 cm     TR Peak grad:   13.1 mmHg MV Decel Time: 241 msec     TR Vmax:        181.00 cm/s MV E velocity: 97.90 cm/s MV A velocity: 116.00 cm/s  SHUNTS MV E/A ratio:  0.84         Systemic VTI:  0.28 m  Systemic Diam: 2.00 cm Skeet Latch MD  Electronically signed by Skeet Latch MD Signature Date/Time: 07/24/2021/2:56:11 PM    Final     Physical Exam  General: Sleepy appearing elderly male in NAD HEENT: Normocephalic, anicteric sclerae  CV: II/VI systolic murmur heard, regular rate and rhythm  Pulmonary: Bibasilar crackles heard Abdominal: Soft, NTND  Extremities: No LE edema  Skin: Lesion present on R superior external ear Neuro: No gross focal deficits Psych: Appropriate behavior and affect  Assessment/Plan: Andrew Humphrey is a 81 y.o. male with a history of paroxysmal afib on Eliquis, HTN, T2DM, and IDA presenting with symptomatic anemia 2/2 GI bleed.   Principal Problem:   Symptomatic anemia Active Problems:   Hypertension   Diabetes mellitus without complication (HCC)   Atrial fibrillation (HCC)   Gastrointestinal hemorrhage  # Acute blood loss anemia 2/2 GI bleed  Positive fecal occult blood test in the ED. S/p 1u of pRBCs on 5/30 after Hgb dropped to 6.7. Hgb increased to 9.2 last night but back down to 7.7 this AM. Will repeat H&H and transfuse for Hgb < 7. EGD and colonoscopy at 9:30 AM to evaluate heme positive stool and anemia. Colonoscopy in 2018 with 4-5 adenomatous colon polyps and diverticulosis, father passed away from colon cancer in early 72s.  - Repeat H&H - Transfuse for Hgb < 7  - f/u EGD and colonoscopy results  - Continue to hold Eliquis  - IV Protonix BID  - GI consulted, appreciate recs   # Paroxsymal afib  EKG on admission showed sinus rhythm with 1st degree AV block. Follows with Cardiology in [place], on Eliquis 5 mg BID and amiodarone 100 mg daily. HR stable in the 60s-70s.  - Continue Amiodarone 100 mg daily  - Hold Eliquis in setting of GI bleed  - Telemetry   # T2DM  Home regimen is Metformin 1000 mg and Glipizide 20 mg daily. - SSI  - CBG q4h   # HTN  BP normotensive, most recently 116/55. - Continue daily amlodipine 10 mg and metoprolol succ 100 mg.   # Concern  for HFpEF, resolved  Patient with bibasilar lung crackles and systolic murmur, obtained Echo due to concern for HF. No JVD or LE edema on physical exam. Echo with LVEF 60-65% and grade I diastolic dysfunction, no findings to warrant Cardiology referral.   # Elevated liver enzymes  AST 82 -> 95, ALT 64 -> 73, tbili wnl. Will check HCV labs to evaluate for chronic hep C infection that can cause mildly elevated liver enzymes and obtain RUQ [place]. Patient and daughter request that any concerning results only be shared with daughter.  - f/u HCV labs  - f/u RUQ [place]   # Concern for malignancy of ear Patient notes that lesion on R ear has been present for "a while" and is painless. High concern for malignancy, patient's daughter states that they do not yet have Dermatology appointment and need a referral.  - Outpatient Dermatology referral   Diet: NPO for GI procedure, resume normal diet after  IVF: None VTE: SCDs CODE: Full  Prior to Admission Living Arrangement: Home Anticipated Discharge Location: Home Barriers to Discharge: Medical stability  Dispo: Anticipated discharge in 1-2 days  Signed: Sabino Dick, [facility] Internal Medicine Teaching Service [phone] Please contact the on call pager after 5 pm and on weekends at [phone].

## 2021-07-25 NOTE — Op Note (Signed)
[facility] Patient Name: Andrew Humphrey Procedure Date : 07/25/2021 MRN: [id number] Attending MD: Jerene Bears , MD Date of Birth: 09/08/1940 CSN: [id number] Age: 81 Admit Type: Inpatient Procedure:                Colonoscopy Indications:              Heme positive stool, acute on chronic anemia Providers:                [name]. Hilarie Fredrickson, MD, Jeanella Cara, RN,                            Despina Pole, Technician Referring MD:             [facility] Medicines:                Monitored Anesthesia Care Complications:            No immediate complications. Estimated Blood Loss:     Estimated blood loss was minimal. Procedure:                Pre-Anesthesia Assessment:                           - Prior to the procedure, a History and Physical                            was performed, and patient medications and                            allergies were reviewed. The patient's tolerance of                            previous anesthesia was also reviewed. The risks                            and benefits of the procedure and the sedation                            options and risks were discussed with the patient.                            All questions were answered, and informed consent                            was obtained. Prior Anticoagulants: The patient has                            taken Eliquis (apixaban), last dose was 2 days                            prior to procedure. ASA Grade Assessment: III - A                            patient with severe systemic disease. After  reviewing the risks and benefits, the patient was                            deemed in satisfactory condition to undergo the                            procedure.                           After obtaining informed consent, the colonoscope                            was passed under direct vision. Throughout the                            procedure,  the patient's blood pressure, pulse, and                            oxygen saturations were monitored continuously. The                            [id number]-[id number] ([id number]) Olympus coloscope was                            introduced through the anus and advanced to the 5                            cm into the ileum. Scope In: 10:44:21 AM Scope Out: 11:01:16 AM Scope Withdrawal Time: 0 hours 14 minutes 47 seconds  Total Procedure Duration: 0 hours 16 minutes 55 seconds  Findings:      The digital rectal exam revealed a less than 1 cm (diameter) mobile       rectal polyp/nodule palpated 1 to 2 cm from the anal verge.      The terminal ileum appeared normal.      Three sessile polyps were found in the descending colon, transverse       colon and ascending colon. The polyps were 3 to 6 mm in size. These       polyps were removed with a cold snare. Resection and retrieval were       complete.      Multiple small and large-mouthed diverticula were found in the sigmoid       colon.      One 10 mm submucosal nodule was found in the distal rectum.      No additional abnormalities were found on retroflexion. Impression:               - Palpable rectal nodule 1 to 2 cm from the anal                            verge.                           - The examined portion of the ileum was normal.                           -  Three 3 to 6 mm polyps in the descending colon,                            in the transverse colon and in the ascending colon,                            removed with a cold snare. Resected and retrieved.                           - Moderate diverticulosis in the sigmoid colon.                           - Submucosal nodule in the distal rectum. No                            ulceration or evidence of bleeding. Moderate Sedation:      N/A Recommendation:           - Return patient to hospital ward for ongoing care.                           - VCE diet protocol.                           -  Continue present medications.                           - Await pathology results.                           - No clear source of heme positive stools on                            today's EGD/colonoscopy. Patient has been seen by                            [facility] for evaluation of his anemia with                            plans for follow-up to exclude MDS if GI evaluation                            negative. Reasonable to proceed with VCE today to                            examine remaining small bowel if patient agreeable                            (with knowledge there will be visible blood in the                            stomach with gastric biopsies done today).  Reasonable to resume Eliquis tomorrow.                           - Repeat Hgb tomorrow if lower still will likely                            need repeat transfusion prior to discharge. Procedure Code(s):        --- Professional ---                           [phone], Colonoscopy, flexible; with removal of                            tumor(s), polyp(s), or other lesion(s) by snare                            technique Diagnosis Code(s):        --- Professional ---                           K62.89, Other specified diseases of anus and rectum                           K63.5, Polyp of colon                           R19.5, Other fecal abnormalities                           K57.30, Diverticulosis of large intestine without                            perforation or abscess without bleeding CPT copyright 2019 American Medical Association. All rights reserved. The codes documented in this report are preliminary and upon coder review may  be revised to meet current compliance requirements. Jerene Bears, MD 07/25/2021 11:34:15 AM This report has been signed electronically. Number of Addenda: 0

## 2021-07-25 NOTE — Anesthesia Preprocedure Evaluation (Signed)
Anesthesia Evaluation  Patient identified by MRN, date of birth, ID band Patient awake    Reviewed: Allergy & Precautions, NPO status , Patient's Chart, lab work & pertinent test results  Airway Mallampati: II  TM Distance: >3 FB Neck ROM: Full    Dental no notable dental hx.    Pulmonary neg pulmonary ROS,    Pulmonary exam normal breath sounds clear to auscultation       Cardiovascular hypertension, Normal cardiovascular exam+ dysrhythmias Atrial Fibrillation  Rhythm:Regular Rate:Normal     Neuro/Psych negative neurological ROS  negative psych ROS   GI/Hepatic negative GI ROS, Neg liver ROS,   Endo/Other  diabetes  Renal/GU negative Renal ROS  negative genitourinary   Musculoskeletal negative musculoskeletal ROS (+)   Abdominal   Peds negative pediatric ROS (+)  Hematology  (+) Blood dyscrasia, anemia ,   Anesthesia Other Findings   Reproductive/Obstetrics negative OB ROS                             Anesthesia Physical Anesthesia Plan  ASA: 3  Anesthesia Plan: MAC   Post-op Pain Management: Minimal or no pain anticipated   Induction: Intravenous  PONV Risk Score and Plan: 1 and Propofol infusion and Treatment may vary due to age or medical condition  Airway Management Planned: Simple Face Mask  Additional Equipment:   Intra-op Plan:   Post-operative Plan:   Informed Consent: I have reviewed the patients History and Physical, chart, labs and discussed the procedure including the risks, benefits and alternatives for the proposed anesthesia with the patient or authorized representative who has indicated his/her understanding and acceptance.     Dental advisory given  Plan Discussed with: CRNA and Surgeon  Anesthesia Plan Comments:         Anesthesia Quick Evaluation

## 2021-07-25 NOTE — Op Note (Signed)
[facility] Patient Name: Andrew Humphrey Procedure Date : 07/25/2021 MRN: [id number] Attending MD: Jerene Bears , MD Date of Birth: 01-05-1941 CSN: [id number] Age: 81 Admit Type: Inpatient Procedure:                Upper GI endoscopy Indications:              Heme positive stool, acute on chronic anemia Providers:                [name]. Hilarie Fredrickson, MD, Jeanella Cara, RN,                            Despina Pole, Technician Referring MD:             [facility] Medicines:                Monitored Anesthesia Care Complications:            No immediate complications. Estimated Blood Loss:     Estimated blood loss was minimal. Procedure:                Pre-Anesthesia Assessment:                           - Prior to the procedure, a History and Physical                            was performed, and patient medications and                            allergies were reviewed. The patient's tolerance of                            previous anesthesia was also reviewed. The risks                            and benefits of the procedure and the sedation                            options and risks were discussed with the patient.                            All questions were answered, and informed consent                            was obtained. Prior Anticoagulants: The patient has                            taken Eliquis (apixaban), last dose was 2 days                            prior to procedure. ASA Grade Assessment: III - A                            patient with severe systemic disease. After  reviewing the risks and benefits, the patient was                            deemed in satisfactory condition to undergo the                            procedure.                           After obtaining informed consent, the endoscope was                            passed under direct vision. Throughout the                             procedure, the patient's blood pressure, pulse, and                            oxygen saturations were monitored continuously. The                            [id number]-[id number] ([id number]) Olympus endoscope was introduced                            through the mouth, and advanced to the second part                            of duodenum. The upper GI endoscopy was                            accomplished without difficulty. The patient                            tolerated the procedure well. Scope In: Scope Out: Findings:      Normal mucosa was found in the entire esophagus.      A 3 cm hiatal hernia was present.      Moderate inflammation characterized by erythema and granularity was       found in the gastric body and in the gastric antrum. Biopsies were taken       with a cold forceps for histology and Helicobacter pylori testing.      The examined duodenum was normal. Impression:               - Normal mucosa was found in the entire esophagus.                           - 3 cm hiatal hernia.                           - Gastritis without evidence of active or recent                            bleeding. Biopsied.                           -  Normal examined duodenum. Moderate Sedation:      N/A Recommendation:           - Patient has a contact number available for                            emergencies. The signs and symptoms of potential                            delayed complications were discussed with the                            patient. Return to normal activities tomorrow.                            Written discharge instructions were provided to the                            patient.                           - Resume previous diet.                           - Continue present medications.                           - Await pathology results.                           - See the other procedure note for documentation of                            additional recommendations. Procedure  Code(s):        --- Professional ---                           [phone], Esophagogastroduodenoscopy, flexible,                            transoral; with biopsy, single or multiple Diagnosis Code(s):        --- Professional ---                           K44.9, Diaphragmatic hernia without obstruction or                            gangrene                           K29.70, Gastritis, unspecified, without bleeding                           R19.5, Other fecal abnormalities CPT copyright 2019 American Medical Association. All rights reserved. The codes documented in this report are preliminary and upon coder review may  be revised to meet current compliance requirements. Jerene Bears, MD 07/25/2021 11:15:02 AM This report has been signed electronically. Number of Addenda:  0 

## 2021-07-25 NOTE — Anesthesia Postprocedure Evaluation (Signed)
Anesthesia Post Note  Patient: Andrew Humphrey  Procedure(s) Performed: ESOPHAGOGASTRODUODENOSCOPY (EGD) WITH PROPOFOL COLONOSCOPY WITH PROPOFOL BIOPSY POLYPECTOMY     Patient location during evaluation: PACU Anesthesia Type: MAC Level of consciousness: awake and alert Pain management: pain level controlled Vital Signs Assessment: post-procedure vital signs reviewed and stable Respiratory status: spontaneous breathing, nonlabored ventilation, respiratory function stable and patient connected to nasal cannula oxygen Cardiovascular status: stable and blood pressure returned to baseline Postop Assessment: no apparent nausea or vomiting Anesthetic complications: no   No notable events documented.  Last Vitals:  Vitals:   07/25/21 1140 07/25/21 1219  BP: 120/68 130/79  Pulse: 67 69  Resp: 16 18  Temp: 36.7 C 36.8 C  SpO2: 99% 100%    Last Pain:  Vitals:   07/25/21 1110  TempSrc:   PainSc: 0-No pain                 Candelario Steppe S

## 2021-07-26 ENCOUNTER — Encounter (HOSPITAL_COMMUNITY): Payer: Self-pay | Admitting: Internal Medicine

## 2021-07-26 ENCOUNTER — Inpatient Hospital Stay (HOSPITAL_COMMUNITY): Payer: Medicare PPO

## 2021-07-26 ENCOUNTER — Encounter: Payer: Self-pay | Admitting: Internal Medicine

## 2021-07-26 LAB — GLUCOSE, CAPILLARY
Glucose-Capillary: 148 mg/dL — ABNORMAL HIGH (ref 70–99)
Glucose-Capillary: 149 mg/dL — ABNORMAL HIGH (ref 70–99)
Glucose-Capillary: 220 mg/dL — ABNORMAL HIGH (ref 70–99)
Glucose-Capillary: 91 mg/dL (ref 70–99)

## 2021-07-26 LAB — CBC
HCT: 24.2 % — ABNORMAL LOW (ref 39.0–52.0)
Hemoglobin: 7.9 g/dL — ABNORMAL LOW (ref 13.0–17.0)
MCH: 35 pg — ABNORMAL HIGH (ref 26.0–34.0)
MCHC: 32.6 g/dL (ref 30.0–36.0)
MCV: 107.1 fL — ABNORMAL HIGH (ref 80.0–100.0)
Platelets: 184 10*3/uL (ref 150–400)
RBC: 2.26 MIL/uL — ABNORMAL LOW (ref 4.22–5.81)
RDW: 24.3 % — ABNORMAL HIGH (ref 11.5–15.5)
WBC: 9.1 10*3/uL (ref 4.0–10.5)
nRBC: 0.8 % — ABNORMAL HIGH (ref 0.0–0.2)

## 2021-07-26 LAB — HCV AB W REFLEX TO QUANT PCR: HCV Ab: NONREACTIVE

## 2021-07-26 LAB — HEMOGLOBIN AND HEMATOCRIT, BLOOD
HCT: 27.3 % — ABNORMAL LOW (ref 39.0–52.0)
Hemoglobin: 8.7 g/dL — ABNORMAL LOW (ref 13.0–17.0)

## 2021-07-26 LAB — SURGICAL PATHOLOGY

## 2021-07-26 LAB — HCV INTERPRETATION

## 2021-07-26 MED ORDER — PANTOPRAZOLE SODIUM 40 MG PO TBEC: 40.0000 mg | DELAYED_RELEASE_TABLET | Freq: Every day | ORAL | 0 refills | Status: AC

## 2021-07-26 NOTE — Progress Notes (Signed)
PT AVS reviewed with pt and he verbalized understanding of all DC teaching and instructions. PT IV removed and has all pt belongings in his possession.

## 2021-07-26 NOTE — Discharge Summary (Addendum)
Name: Andrew Humphrey MRN: [id number] DOB: [birth year] 81 y.o. PCP: Lanelle Bal, PA-C  Date of Admission: 07/23/2021 11:16 AM Date of Discharge: 07/26/2021 Attending Physician: Sid Falcon, MD  Discharge Diagnosis: 1. Anemia 2/2 GI bleed  2. Paroxysmal atrial fibrillation 3. Elevated transaminases 4. Ear lesion  Discharge Medications: Allergies as of 07/26/2021   No Known Allergies      Medication List     STOP taking these medications    omeprazole 40 MG capsule Commonly known as: PRILOSEC       TAKE these medications    acetaminophen 325 MG tablet Commonly known as: TYLENOL Take 2 tablets (650 mg total) by mouth every 6 (six) hours as needed for mild pain (or Fever >/= 101).   amiodarone 200 MG tablet Commonly known as: PACERONE Take 0.5 tablets (100 mg total) by mouth daily.   amLODipine 10 MG tablet Commonly known as: NORVASC Take by mouth.   Eliquis 5 MG Tabs tablet Generic drug: apixaban Take 1 tablet by mouth twice daily   ferrous sulfate 325 (65 FE) MG tablet Take 325 mg by mouth daily with breakfast.   glipiZIDE 10 MG 24 hr tablet Commonly known as: GLUCOTROL XL Take 20 mg by mouth daily.   metFORMIN 1000 MG tablet Commonly known as: GLUCOPHAGE Take 1,000 mg by mouth in the morning and at bedtime.   metoprolol succinate 100 MG 24 hr tablet Commonly known as: TOPROL-XL TAKE 1 TABLET BY MOUTH ONCE DAILY WITH  OR  IMMEDIATELY  FOLLOWING  A  MEAL   pantoprazole 40 MG tablet Commonly known as: Protonix Take 1 tablet (40 mg total) by mouth daily.   VITAMIN B 12 PO Take 1,000 mcg by mouth.   Vitamin D-3 25 MCG (1000 UT) Caps Take by mouth.        Disposition and follow-up:   [name] was discharged from [facility] in Stable condition.  At the hospital follow up visit please address:  1.  Anemia 2/2 GI bleed - follow-up biopsy/VCE results; should be taking pantoprazole '40mg'$  daily and follow-up with  GI; ok to continue Eliquis  2. Elevated transaminases - Unclear etiology, HCV negative, RUQ unremarkable. Would re-check these at follow-up.  3. Ear lesion - concern for malignancy, needs dermatology referral  4.  Labs / imaging needed at time of follow-up: CBC, hepatic function panel, Hep A and Hep B serologies, ANA, SMA, AMA and IgG levels.  5.  Pending labs/ test needing follow-up: Surgical pathology from EGD and colonoscopy, VCE   Follow-up Appointments:  Follow-up Information     [facility] Follow up on 08/13/2021.   Why: Hem/Onc Cancer Care appointment 08/13/21 at 10 AM Contact information: [address] [place] [address] [phone]         [place]. Go on 08/14/2021.   Why: NEW APPT 08/14/21 at 9 AM Contact information: [address] [place]        Lanelle Bal, PA-C. Go on 08/06/2021.   Specialty: Family Medicine Contact information: [place] [address] [phone]                 Follow-up [place] [place] Follow up on 08/13/2021.   Why: Hem/Onc Cancer Care appointment 08/13/21 at 10 AM Contact information: [address] [place] [address] [phone]         [place].  Go on 08/14/2021.   Why: NEW APPT 08/14/21 at 9 AM Contact information: [address] [place]        Lanelle Bal, PA-C. Go on 08/06/2021.   Specialty: Family Medicine Contact information: [place] [address] [place] Hospital Course by problem list: Andrew Humphrey is a 81 y.o. male with past history of paroxysmal afib, HTN, and T2DM who was admitted for symptomatic anemia 2/2 to GI bleed. Hospital course by problem is outlined below.   ED Course On arrival to the ED, patient was normotensive with BP 118/74, HR 68, and satting 100% on RA. Labs  notable for WBC 16.4, macrocytic anemia with Hgb 7.2, MCV 112, and positive fecal occult blood test. Troponin 6 and 6, BNP 68.3. Received bolus of IV NS. GI consulted due to concern for GI bleed.    Acute blood loss anemia 2/2 GI bleed Received 1u pRBCs after Hgb dropped to 6.7. Patient was started on pantoprazole 40 mg. GI performed EGD and colonoscopy to evaluate heme positive stool and anemia. EGD with gastritis, biopsied, and 3 cm hiatal hernia, no evidence of active or recent bleeding. Colonoscopy findings included three 3 to 6 mm polyps, moderate diverticulosis in the sigmoid colon, and submucosal nodule in the distal rectum, no ulceration or evidence of bleeding. GI recommended VCE, results were pending at time of discharge. On day of discharge, patient's Hgb remained stable and Hgb was 8.7 at time of discharge. Eliquis 5 mg BID was restarted on discharge.  Afib  Patient's Eliquis 5 mg BID was held in the setting of GI bleed. Amiodarone 100 mg and metoprolol 100 mg daily were continued.  Concern for HFpEF, resolved Patient with SOB, systolic murmur, and bibasilar crackles on lung exam. Echo obtained, showed LVEF 60-65% and grade I diastolic dysfunction, no findings warranting Cardiology referral.   Elevated liver enzymes  Liver enzymes mildly elevated AST 95, ALT 73. HCV antibody negative, RUQ US wnl. GI recommended outpatient follow-up to include checking Hep A and Hep B serologies, ANA, SMA, AMA and IgG levels.  Ear lesion concerning for malignancy Painless raised lesion present on R ear. High concern for malignancy, patient's daughter states that they do not yet have Dermatology appointment and need a referral.   HTN  Patient was continued on home amlodipine 10 mg. He remained normotensive during his admission.   DM Patient was placed on SSI with routine CBG monitoring during his stay.    Discharge Exam:   BP 127/63   Pulse 94   Temp 98.1 F (36.7 C) (Oral)   Resp 18   Ht '5\' 7"'$   (1.702 m)   Wt 87 kg   SpO2 98%   BMI 30.04 kg/m  Discharge exam:  Gen: Elderly gentleman in NAD  HEENT: Normocephalic, raised, scabbed lesion present on R external ear CV: II/VI systolic murmur, normal rate  Pulm: Lungs clear anteriorly, breathing comfortably on room air  Abd: Soft, nontender, normal bowel sounds  Extremities: No LE edema Neuro: A&Ox3, no focal deficits  Psych: Normal behavior and affect   Pertinent Labs, Studies, and Procedures:  Hgb 7.2 -> 6.7 s/p 1u pRBC -> 7.7-> 8.4 -> 9.2 -> 7.7 -> 8.3 -> 7.9 -> 8.7 AST 82 -> 95 ALT 64 -> 73 HCV antibody negative   RUQ [place]  07/26/21  IMPRESSION: Possible minimal fluid in the cholecystectomy bed.  Unremarkable appearance of the liver.  Video Capsule Endoscopy  07/25/21 Results Pending   Colonoscopy  07/25/21 IMPRESSION:  - Palpable rectal nodule 1 to 2 cm from the anal verge. - The examined portion of the ileum was normal. - Three 3 to 6 mm polyps in the descending colon, in the transverse colon and in the ascending colon, removed with a cold snare. Resected and retrieved. - Moderate diverticulosis in the sigmoid colon. - Submucosal nodule in the distal rectum. No ulceration or evidence of bleeding.  Upper Endoscopy 07/25/21 IMPRESSION:  - Normal mucosa was found in the entire esophagus. - 3 cm hiatal hernia. - Gastritis without evidence of active or recent bleeding. Biopsied. - Normal examined duodenum.  Echocardiogram  07/24/21 IMPRESSIONS:   1. Left ventricular ejection fraction, by estimation, is 60 to 65%. The  left ventricle has normal function. The left ventricle has no regional wall motion abnormalities. There is mild concentric left ventricular hypertrophy. Left ventricular diastolic parameters are consistent with Grade I diastolic dysfunction (impaired relaxation). Elevated left ventricular end-diastolic pressure.   2. Right ventricular systolic function is normal. The right ventricular size is normal. There is  normal pulmonary artery systolic pressure.   3. Left atrial size was moderately dilated.   4. The mitral valve is normal in structure. Trivial mitral valve regurgitation. No evidence of mitral stenosis. Moderate mitral annular calcification.   5. The aortic valve is tricuspid. There is mild calcification of the aortic valve. There is mild thickening of the aortic valve. Aortic valve regurgitation is mild. Mild aortic valve stenosis. Aortic valve area, by VTI measures 1.66 cm. Aortic valve  mean gradient measures 12.0 mmHg. Aortic valve Vmax measures 2.35 m/s.   6. Aortic dilatation noted. There is mild dilatation of the aortic root, measuring 36 mm.   7. The inferior vena cava is normal in size with greater than 50% respiratory variability, suggesting right atrial pressure of 3 mmHg.   Discharge Instructions: You were admitted for having low hemoglobin of 7.2 likely from bleeding in your gastrointestinal tract. You had an upper endoscopy and colonoscopy to look for sources of bleeding in your esophagus and large intestines, and no cause of active bleeding was found. You then had a video capsule endoscopy to examine your small intestine for bleeding, those results are pending. Your echocardiogram showed a normal heart. Your liver ultrasound was normal. When we discharged you, your hemoglobin increased to 8.7. Please have your primary care doctor check your hemoglobin levels at your upcoming appointment.  Please contact your primary doctor if you start feeling fatigued and short of breath or notice dark stools before your appointment.   SignedSabino Dick, MS4 07/26/2021, 1:07 PM   Pager: [phone]  Please contact the on call pager after 5 pm and on weekends at [phone].  Attestation for Student Documentation:  I personally was present and performed or re-performed the history, physical exam and medical decision-making activities of this service and have verified that the service and  findings are accurately documented in the student's note.  Sanjuan Dame, MD Internal Medicine Resident PGY-2 Pager: [phone] 07/26/2021, 1:07 PM

## 2021-07-26 NOTE — Discharge Instructions (Addendum)
You were admitted for having low hemoglobin of 7.2 likely from bleeding in your gastrointestinal tract. You had an upper endoscopy and colonoscopy to look for sources of bleeding in your esophagus and large intestines, and no cause of active bleeding was found. You then had a video capsule endoscopy to examine your small intestine for bleeding, those results are pending. Your echocardiogram showed a normal heart. Your liver ultrasound was normal. When we discharged you, your hemoglobin increased to 8.7. Please have your primary care doctor check your hemoglobin levels at your upcoming appointment. Please contact your primary doctor if you start feeling fatigued and short of breath or notice dark stools before your appointment.

## 2021-07-26 NOTE — Progress Notes (Signed)
   [facility] Progress Note  CC:   FOBT +, macrocytic anemia.  Subjective: Feels well this morning.  He is eating a solid diet without any difficulty.  No nausea or vomiting.  No abdominal pain.  He passed a small loose stool this morning which he stated was not black not black.  No bright red rectal bleeding.  No chest pain or shortness of breath.   Objective:  Vital signs in last 24 hours: Temp:  [98 F (36.7 C)-98.5 F (36.9 C)] 98.5 F (36.9 C) (06/02 0511) Pulse Rate:  [65-87] 84 (06/02 0511) Resp:  [13-18] 18 (06/02 0511) BP: (108-130)/(68-79) 125/74 (06/02 0511) SpO2:  [98 %-100 %] 98 % (06/02 0511) Weight:  [87 kg] 87 kg (06/02 0500) Last BM Date : 07/25/21 General: 81-year-old male sitting up at the bedside in no acute distress Heart: Regular rate and rhythm, soft systolic murmur. Pulm: Breath sounds clear throughout Abdomen: Soft, nondistended.  Tender positive bowel sounds to all 4 quadrants. Extremities:  Without edema. Neurologic:  Alert and  oriented x 4. Grossly normal neurologically. Psych:  Alert and cooperative. Normal mood and affect.  Intake/Output from previous day: 06/01 0701 - 06/02 0700 In: 740 [P.O.:240; I.V.:500] Out: 500 [Urine:500] Intake/Output this shift: No intake/output data recorded.  Lab Results: Recent Labs    07/24/21 0619 07/24/21 1233 07/25/21 0237 07/25/21 1417 07/26/21 0229  WBC 10.9*  --  10.6*  --  9.1  HGB 7.7*   < > 7.7* 8.3* 7.9*  HCT 24.0*   < > 24.3* 26.2* 24.2*  PLT 176  --  178  --  184   < > = values in this interval not displayed.   BMET Recent Labs    07/23/21 1144 07/24/21 0619 07/25/21 0237  NA 135 133* 136  K 4.0 3.8 3.7  CL 104 105 111  CO2 19* 20* 17*  GLUCOSE 193* 114* 118*  BUN 19 12 7*  CREATININE 1.09 0.91 0.95  CALCIUM 8.9 8.0* 7.9*   LFT Recent Labs    07/23/21 2227 07/25/21 0237  PROT 6.4* 6.2*  ALBUMIN 3.1* 3.1*  AST 82* 95*  ALT 64* 73*  ALKPHOS 41 47  BILITOT  0.4 0.9  BILIDIR 0.2  --   IBILI 0.2*  --    PT/INR No results for input(s): LABPROT, INR in the last 72 hours. Hepatitis Panel No results for input(s): HEPBSAG, HCVAB, HEPAIGM, HEPBIGM in the last 72 hours.  ECHOCARDIOGRAM COMPLETE  Result Date: 07/24/2021    ECHOCARDIOGRAM REPORT   Patient Name:   Andrew Humphrey Date of Exam: 07/24/2021 Medical Rec #:  [id number]         Height:       67.0 in Accession #:    [id number]        Weight:       195.3 lb Date of Birth:  01/01/1941         BSA:          2.002 m Patient Age:    81 years          BP:           115/61 mmHg Patient Gender: M                 HR:           69 bpm. Exam Location:  Inpatient Procedure: 2D Echo, Cardiac Doppler and Color Doppler Indications:    Dyspnea  History:          Patient has prior history of Echocardiogram examinations.                 Arrythmias:Atrial Fibrillation; Risk Factors:Hypertension and                 Diabetes.  Sonographer:    Jyl Heinz Referring Phys: [id number] [place]  1. Left ventricular ejection fraction, by estimation, is 60 to 65%. The left ventricle has normal function. The left ventricle has no regional wall motion abnormalities. There is mild concentric left ventricular hypertrophy. Left ventricular diastolic parameters are consistent with Grade I diastolic dysfunction (impaired relaxation). Elevated left ventricular end-diastolic pressure.  2. Right ventricular systolic function is normal. The right ventricular size is normal. There is normal pulmonary artery systolic pressure.  3. Left atrial size was moderately dilated.  4. The mitral valve is normal in structure. Trivial mitral valve regurgitation. No evidence of mitral stenosis. Moderate mitral annular calcification.  5. The aortic valve is tricuspid. There is mild calcification of the aortic valve. There is mild thickening of the aortic valve. Aortic valve regurgitation is mild. Mild aortic valve stenosis. Aortic valve area, by VTI  measures 1.66 cm. Aortic valve mean gradient measures 12.0 mmHg. Aortic valve Vmax measures 2.35 m/s.  6. Aortic dilatation noted. There is mild dilatation of the aortic root, measuring 36 mm.  7. The inferior vena cava is normal in size with greater than 50% respiratory variability, suggesting right atrial pressure of 3 mmHg. FINDINGS  Left Ventricle: Left ventricular ejection fraction, by estimation, is 60 to 65%. The left ventricle has normal function. The left ventricle has no regional wall motion abnormalities. The left ventricular internal cavity size was normal in size. There is  mild concentric left ventricular hypertrophy. Left ventricular diastolic parameters are consistent with Grade I diastolic dysfunction (impaired relaxation). Elevated left ventricular end-diastolic pressure. Right Ventricle: The right ventricular size is normal. No increase in right ventricular wall thickness. Right ventricular systolic function is normal. There is normal pulmonary artery systolic pressure. The tricuspid regurgitant velocity is 1.81 m/s, and  with an assumed right atrial pressure of 15 mmHg, the estimated right ventricular systolic pressure is 68.0 mmHg. Left Atrium: Left atrial size was moderately dilated. Right Atrium: Right atrial size was normal in size. Pericardium: There is no evidence of pericardial effusion. Mitral Valve: The mitral valve is normal in structure. Moderate mitral annular calcification. Trivial mitral valve regurgitation. No evidence of mitral valve stenosis. Tricuspid Valve: The tricuspid valve is normal in structure. Tricuspid valve regurgitation is trivial. No evidence of tricuspid stenosis. Aortic Valve: The aortic valve is tricuspid. There is mild calcification of the aortic valve. There is mild thickening of the aortic valve. Aortic valve regurgitation is mild. Mild aortic stenosis is present. Aortic valve mean gradient measures 12.0 mmHg. Aortic valve peak gradient measures 22.1 mmHg.  Aortic valve area, by VTI measures 1.66 cm. Pulmonic Valve: The pulmonic valve was normal in structure. Pulmonic valve regurgitation is mild. No evidence of pulmonic stenosis. Aorta: Aortic dilatation noted. There is mild dilatation of the aortic root, measuring 36 mm. Venous: The inferior vena cava is normal in size with greater than 50% respiratory variability, suggesting right atrial pressure of 3 mmHg. IAS/Shunts: No atrial level shunt detected by color flow Doppler.  LEFT VENTRICLE PLAX 2D LVIDd:         4.90 cm      Diastology LVIDs:         2.80 cm  LV e' medial:    4.57 cm/s LV PW:         1.30 cm      LV E/e' medial:  21.4 LV IVS:        1.20 cm      LV e' lateral:   6.42 cm/s LVOT diam:     2.00 cm      LV E/e' lateral: 15.2 LV SV:         88 LV SV Index:   44 LVOT Area:     3.14 cm  LV Volumes (MOD) LV vol d, MOD A2C: 121.0 ml LV vol d, MOD A4C: 127.0 ml LV vol s, MOD A2C: 52.6 ml LV vol s, MOD A4C: 52.4 ml LV SV MOD A2C:     68.4 ml LV SV MOD A4C:     127.0 ml LV SV MOD BP:      73.6 ml RIGHT VENTRICLE            IVC RV Basal diam:  3.00 cm    IVC diam: 2.10 cm RV Mid diam:    2.70 cm RV S prime:     8.81 cm/s TAPSE (M-mode): 2.3 cm LEFT ATRIUM             Index        RIGHT ATRIUM           Index LA diam:        5.10 cm 2.55 cm/m   RA Area:     11.50 cm LA Vol (A2C):   63.7 ml 31.82 ml/m  RA Volume:   22.40 ml  11.19 ml/m LA Vol (A4C):   69.9 ml 34.91 ml/m LA Biplane Vol: 69.6 ml 34.76 ml/m  AORTIC VALVE AV Area (Vmax):    1.68 cm AV Area (Vmean):   1.66 cm AV Area (VTI):     1.66 cm AV Vmax:           235.00 cm/s AV Vmean:          162.667 cm/s AV VTI:            0.527 m AV Peak Grad:      22.1 mmHg AV Mean Grad:      12.0 mmHg LVOT Vmax:         126.00 cm/s LVOT Vmean:        85.900 cm/s LVOT VTI:          0.279 m LVOT/AV VTI ratio: 0.53  AORTA Ao Root diam: 3.60 cm Ao Asc diam:  3.50 cm MITRAL VALVE                TRICUSPID VALVE MV Area (PHT): 3.15 cm     TR Peak grad:   13.1 mmHg MV  Decel Time: 241 msec     TR Vmax:        181.00 cm/s MV E velocity: 97.90 cm/s MV A velocity: 116.00 cm/s  SHUNTS MV E/A ratio:  0.84         Systemic VTI:  0.28 m                             Systemic Diam: 2.00 cm Skeet Latch MD Electronically signed by Skeet Latch MD Signature Date/Time: 07/24/2021/2:56:11 PM    Final     Assessment / Plan:  80) 81 year old male with acute on chronic macrocytic anemia, upper GI bleed/melena. FOBT +. On Eliquis.  EGD 6/1 identified a 3 cm hiatal hernia and gastritis without evidence of active bleeding.  Colonoscopy 6/1 identified 3 polyps removed from the descending, transverse and ascending colon, moderate diverticulosis in the sigmoid colon and a palpable rectal nodule 1 to 2 cm from the anal verge. VCE placed, results pending.  Hemoglobin 7.7 -> 8.3 -> 2AM today Hg 7.9. Iron 97.  Ferritin 967.  B12 1007.  No further melenic stools.  Hemodynamically stable. -Repeat H&H  -Consider discharge home today if repeat H&H stable and if he does not demonstrate any active GI bleeding -Await EGD and colonoscopy biopsy results -Await VCE result -Continue Pantoprazole 40 mg daily -Restart Eliquis today if hemoglobin stable, no active GI bleeding -Patient will need to follow-up with his [facility] hematologist  -Await further recommendations per Dr. Pyrtle   2) Afib on Eliquis and Amiodarone    3) Elevated transaminases. S/P lap chole and biliary pancreatitis 12/2019.  AST 95.  ALT 73.  Alk phos 47.  Hep C antibody nonreactive.  RUQ sono today identified a normal liver with possible minimal fluid in the cholecystectomy bed.  Elevated LFTs may be due to Amiodarone. -Recommend outpatient follow-up for follow up to include checking Hep A and Hep B serologies, ANA, SMA, AMA and IgG levels.    Principal Problem:   Symptomatic anemia Active Problems:   Hypertension   Diabetes mellitus without complication (HCC)   Atrial fibrillation (HCC)   Gastrointestinal hemorrhage    Heme positive stool   Benign neoplasm of ascending colon   Benign neoplasm of transverse colon   Benign neoplasm of descending colon   Gastritis without bleeding     LOS: 2 days    M Kennedy-Smith  07/26/2021, 9:28 AM    

## 2021-08-06 DIAGNOSIS — K810 Acute cholecystitis: Secondary | ICD-10-CM | POA: Diagnosis not present

## 2021-08-06 DIAGNOSIS — D472 Monoclonal gammopathy: Secondary | ICD-10-CM | POA: Diagnosis not present

## 2021-08-06 DIAGNOSIS — D62 Acute posthemorrhagic anemia: Secondary | ICD-10-CM | POA: Diagnosis not present

## 2021-08-06 DIAGNOSIS — K851 Biliary acute pancreatitis without necrosis or infection: Secondary | ICD-10-CM | POA: Diagnosis not present

## 2021-08-06 DIAGNOSIS — E039 Hypothyroidism, unspecified: Secondary | ICD-10-CM | POA: Diagnosis not present

## 2021-08-06 DIAGNOSIS — D649 Anemia, unspecified: Secondary | ICD-10-CM | POA: Diagnosis not present

## 2021-08-06 DIAGNOSIS — E871 Hypo-osmolality and hyponatremia: Secondary | ICD-10-CM | POA: Diagnosis not present

## 2021-08-06 DIAGNOSIS — Z8719 Personal history of other diseases of the digestive system: Secondary | ICD-10-CM | POA: Diagnosis not present

## 2021-08-06 DIAGNOSIS — Z7984 Long term (current) use of oral hypoglycemic drugs: Secondary | ICD-10-CM | POA: Diagnosis not present

## 2021-08-06 DIAGNOSIS — Z7901 Long term (current) use of anticoagulants: Secondary | ICD-10-CM | POA: Diagnosis not present

## 2021-08-14 ENCOUNTER — Encounter: Payer: Self-pay | Admitting: Gastroenterology

## 2021-08-14 ENCOUNTER — Ambulatory Visit (INDEPENDENT_AMBULATORY_CARE_PROVIDER_SITE_OTHER): Payer: Medicare PPO | Admitting: Gastroenterology

## 2021-08-14 VITALS — BP 154/79 | HR 66 | Temp 98.0°F | Ht 67.0 in | Wt 199.9 lb

## 2021-08-14 DIAGNOSIS — R7989 Other specified abnormal findings of blood chemistry: Secondary | ICD-10-CM | POA: Diagnosis not present

## 2021-08-14 DIAGNOSIS — Z8719 Personal history of other diseases of the digestive system: Secondary | ICD-10-CM | POA: Diagnosis not present

## 2021-08-14 NOTE — Patient Instructions (Signed)
You can decrease iron to twice a day.  Please have blood work done at [facility] in 2 weeks (this lab is right next to this office on the second floor).  If you notice any bright red blood or dark/tarry stool, please call us right away.   We will see you in 3 months!  It was a pleasure to see you today. I want to create trusting relationships with patients to provide genuine, compassionate, and quality care. I value your feedback. If you receive a survey regarding your visit,  I greatly appreciate you taking time to fill this out.   Annitta Needs, PhD, ANP-BC [facility]

## 2021-08-14 NOTE — Progress Notes (Signed)
Gastroenterology Office Note    Referring Provider: Lanelle Bal, PA-C Primary Care Physician:  Lanelle Bal, PA-C  Primary GI: Dr. Abbey Chatters   Chief Complaint   Chief Complaint  Patient presents with   Anemia    Patient here today to follow up on symptomatic anemia. He was seen in the hospital on 07/23/2021. Patient denies any dark or bloody stools, denies any dizziness. He is having some shortness of breath upon exertion. He is taking ferrous sulfate QID. He does see a Engineer, civil (consulting) at [facility].     History of Present Illness   ARTHURO CANELO is a 81 y.o. male presenting today at the request of [place], PA-C due to anemia. He actually presented to the hospital in [place] shortly after referral request and was seen by [facility] with acute on chronic anemia, heme positive, on Eliquis. He underwent EGD unrevealing, colonoscopy with adenomas, and capsule study that was negative.   Discharge Hgb was 8.7. Most recently, CBC through Hematology at [facility] with Hgb 10.8, MCV 107.2. He has received prior transfusion at the ED in March 2023 as outpatient after routine PCP visit revealed Hgb 7. 2 units PRBCs while inpatient in [facility] June 2023.   He has been seen by Hematology as of 6/13, with plans to return in September 2023. Felt anemia multifactorial in setting of GI loss and possible underlying MDS.   Freddy Finner, is present today. Daughter Velna Hatchet, an RT at [employer], is present via facetime.   Patient denies any shortness of breath at rest. Notes mild DOE. Feels much improved from previously. No overt GI bleeding. No NSAIDs. Only takes tylenol as needed. No abdominal pain. Continues on Eliquis.  Elevated transaminases noted less than 3 times upper limits of normal while inpatient. Most recent HFP with normalized ALT and AST improved to 59 from 90 range. US abdomen while inpatient unremarkable.      Past Medical History:  Diagnosis Date   Anemia     Essential hypertension    History of atrial fibrillation    History of GI bleed    Type 2 diabetes mellitus ([place])     Past Surgical History:  Procedure Laterality Date   BIOPSY  07/25/2021   Procedure: BIOPSY;  Surgeon: Jerene Bears, MD;  Location: [facility] ENDOSCOPY;  Service: Gastroenterology;;   CHOLECYSTECTOMY N/A 01/04/2020   Procedure: LAPAROSCOPIC CHOLECYSTECTOMY;  Surgeon: Coralie Keens, MD;  Location: [place];  Service: General;  Laterality: N/A;   COLONOSCOPY N/A 11/09/2016   normal TI, one 3 mm polyp in cecum, three 4-5 mm polyps in mid descending colon, proximal transverse, and proximal ascending. Four adenomas.   COLONOSCOPY WITH PROPOFOL N/A 07/25/2021   palpable rectal nodule 1-2 cm from anal verge, three 3-6 mm in descending, transverse, and ascending colon s/p removal. Moderate diverticulosis in sigmoid, submucosal nodule in distal rectum. Tubular adenomas.   ESOPHAGOGASTRODUODENOSCOPY (EGD) WITH PROPOFOL N/A 07/25/2021   normal mucosa, 3 cm hiatal hernia, gastritis, normal duodenum. Negative H.pylori   GIVENS CAPSULE STUDY  07/2021   negative   POLYPECTOMY  07/25/2021   Procedure: POLYPECTOMY;  Surgeon: Jerene Bears, MD;  Location: [facility] ENDOSCOPY;  Service: Gastroenterology;;   TESTICLE SURGERY     1977   TONSILLECTOMY      Current Outpatient Medications  Medication Sig Dispense Refill   acetaminophen (TYLENOL) 325 MG tablet Take 2 tablets (650 mg total) by mouth every 6 (six) hours as needed for mild pain (  or Fever >/= 101).     amiodarone (PACERONE) 200 MG tablet Take 0.5 tablets (100 mg total) by mouth daily. 45 tablet 3   amLODipine (NORVASC) 10 MG tablet Take by mouth.     apixaban (ELIQUIS) 5 MG TABS tablet Take 1 tablet by mouth twice daily 60 tablet 3   Cholecalciferol (VITAMIN D-3) 25 MCG (1000 UT) CAPS Take by mouth.     Cyanocobalamin (VITAMIN B 12 PO) Take 1,000 mcg by mouth.     ferrous sulfate 325 (65 FE) MG tablet Take 325 mg by mouth daily with  breakfast.     furosemide (LASIX) 20 MG tablet Take 20 mg by mouth daily as needed.     glipiZIDE (GLUCOTROL XL) 10 MG 24 hr tablet Take 20 mg by mouth in the morning and at bedtime.     levothyroxine (SYNTHROID) 25 MCG tablet Take 25 mcg by mouth daily.     metFORMIN (GLUCOPHAGE) 1000 MG tablet Take 1,000 mg by mouth daily with breakfast.     metoprolol succinate (TOPROL-XL) 100 MG 24 hr tablet TAKE 1 TABLET BY MOUTH ONCE DAILY WITH  OR  IMMEDIATELY  FOLLOWING  A  MEAL 90 tablet 1   Multiple Vitamins-Minerals (MULTIVITAMIN WITH MINERALS) tablet Take 1 tablet by mouth daily.     pantoprazole (PROTONIX) 40 MG tablet Take 1 tablet (40 mg total) by mouth daily. 30 tablet 0   No current facility-administered medications for this visit.    Allergies as of 08/14/2021   (No Known Allergies)    Family History  Problem Relation Age of Onset   Hypertension Father    Colon cancer Father    COPD Sister    Hypertension Brother    Colon polyps Neg Hx     Social History   Socioeconomic History   Marital status: Single    Spouse name: Not on file   Number of children: Not on file   Years of education: Not on file   Highest education level: Not on file  Occupational History   Not on file  Tobacco Use   Smoking status: Never   Smokeless tobacco: Current    Types: Chew   Tobacco comments:    Chew tobacco occasionally  Vaping Use   Vaping Use: Never used  Substance and Sexual Activity   Alcohol use: Yes    Comment: occasional beer   Drug use: No   Sexual activity: Not on file  Other Topics Concern   Not on file  Social History Narrative   Not on file   Social Determinants of Health   Financial Resource Strain: Not on file  Food Insecurity: Not on file  Transportation Needs: Not on file  Physical Activity: Not on file  Stress: Not on file  Social Connections: Not on file  Intimate Partner Violence: Not on file     Review of Systems   Gen: Denies any fever, chills,  fatigue, weight loss, lack of appetite.  CV: Denies chest pain, heart palpitations, peripheral edema, syncope.  Resp: see HPI GI: see HPI GU : Denies urinary burning, urinary frequency, urinary hesitancy MS: Denies joint pain, muscle weakness, cramps, or limitation of movement.  Derm: Denies rash, itching, dry skin Psych: Denies depression, anxiety, memory loss, and confusion Heme: Denies bruising, bleeding, and enlarged lymph nodes.   Physical Exam   BP (!) 154/79 (BP Location: Left Arm, Patient Position: Sitting, Cuff Size: Small)   Pulse 66   Temp 98 F (36.7 C) (  Oral)   Ht '5\' 7"'$  (1.702 m)   Wt 199 lb 14.4 oz (90.7 kg)   BMI 31.31 kg/m  General:   Alert and oriented. Pleasant and cooperative. Well-nourished and well-developed.  Head:  Normocephalic and atraumatic. Eyes:  Without icterus Ears:  Normal auditory acuity. Right ear with growth, serosanguineous drainage, concern for malignancy, PCP aware, derm referral in process  Lungs:  Clear to auscultation bilaterally.  Heart:  S1, S2 present without murmurs appreciated.  Abdomen:  +BS, soft, non-tender and non-distended. No HSM noted. No guarding or rebound. No masses appreciated.  Rectal:  Deferred  Msk:  Symmetrical without gross deformities. Normal posture. Extremities:  Without edema. Neurologic:  Alert and  oriented x4;  grossly normal neurologically. Skin:  Intact without significant lesions or rashes. Psych:  Alert and cooperative. Normal mood and affect.   Assessment   JEDEDIAH NODA is a 81 y.o. male presenting today  at the request of [place], PA-C due to anemia. He actually presented to the hospital in [place] shortly after referral request and was seen by [facility] with acute on chronic anemia, heme positive, on Eliquis. He underwent EGD unrevealing, colonoscopy with adenomas, and capsule study that was negative.   Occult GI bleeding: no overt GI bleeding appreciated. Thorough evaluation as noted.  Unable to exclude occult Dieulafoy lesion not appreciated on capsule/EGD. Occult oozing in setting of Eliquis unable to be excluded. Received 2 units in March and 2 units while inpatient in June 2023. Hgb improving. Will decrease iron to BID instead of QID. Continue close follow-up with Hematology. Will need closer monitoring of H/H to exclude slow stuttering bleed.   Elevated LFTs: while inpatient. Now improving. Liver normal. Repeat in 2 weeks.    PLAN   No endoscopic procedures indicated Recommend stat CTA if overt GI bleeding Repeat CBC and HFP in 2 weeks Call if black, tarry stool or hematochezia Continue Eliquis for now.  Keep follow-up with Hematology Return in 3 months Pt to reach out to PCP regarding dermatology referral status: growth on ear  Annitta Needs, PhD, ANP-BC [facility] Gastroenterology

## 2021-08-17 ENCOUNTER — Other Ambulatory Visit: Payer: Self-pay | Admitting: Student

## 2021-08-21 ENCOUNTER — Other Ambulatory Visit: Payer: Self-pay | Admitting: Student

## 2021-08-25 DIAGNOSIS — Z6831 Body mass index (BMI) 31.0-31.9, adult: Secondary | ICD-10-CM | POA: Diagnosis not present

## 2021-08-25 DIAGNOSIS — Z8719 Personal history of other diseases of the digestive system: Secondary | ICD-10-CM | POA: Diagnosis not present

## 2021-08-25 DIAGNOSIS — D649 Anemia, unspecified: Secondary | ICD-10-CM | POA: Diagnosis not present

## 2021-08-25 DIAGNOSIS — E1165 Type 2 diabetes mellitus with hyperglycemia: Secondary | ICD-10-CM | POA: Diagnosis not present

## 2021-08-25 DIAGNOSIS — I10 Essential (primary) hypertension: Secondary | ICD-10-CM | POA: Diagnosis not present

## 2021-08-25 DIAGNOSIS — I4891 Unspecified atrial fibrillation: Secondary | ICD-10-CM | POA: Diagnosis not present

## 2021-10-15 DIAGNOSIS — C44212 Basal cell carcinoma of skin of right ear and external auricular canal: Secondary | ICD-10-CM | POA: Diagnosis not present

## 2021-10-22 DIAGNOSIS — C44222 Squamous cell carcinoma of skin of right ear and external auricular canal: Secondary | ICD-10-CM | POA: Diagnosis not present

## 2021-10-31 DIAGNOSIS — Z8673 Personal history of transient ischemic attack (TIA), and cerebral infarction without residual deficits: Secondary | ICD-10-CM | POA: Diagnosis not present

## 2021-10-31 DIAGNOSIS — Z8719 Personal history of other diseases of the digestive system: Secondary | ICD-10-CM | POA: Diagnosis not present

## 2021-10-31 DIAGNOSIS — Z6830 Body mass index (BMI) 30.0-30.9, adult: Secondary | ICD-10-CM | POA: Diagnosis not present

## 2021-10-31 DIAGNOSIS — Z0001 Encounter for general adult medical examination with abnormal findings: Secondary | ICD-10-CM | POA: Diagnosis not present

## 2021-10-31 DIAGNOSIS — D649 Anemia, unspecified: Secondary | ICD-10-CM | POA: Diagnosis not present

## 2021-10-31 DIAGNOSIS — E1165 Type 2 diabetes mellitus with hyperglycemia: Secondary | ICD-10-CM | POA: Diagnosis not present

## 2021-10-31 DIAGNOSIS — I10 Essential (primary) hypertension: Secondary | ICD-10-CM | POA: Diagnosis not present

## 2021-10-31 DIAGNOSIS — I4891 Unspecified atrial fibrillation: Secondary | ICD-10-CM | POA: Diagnosis not present

## 2021-11-06 DIAGNOSIS — I10 Essential (primary) hypertension: Secondary | ICD-10-CM | POA: Diagnosis not present

## 2021-11-06 DIAGNOSIS — R6510 Systemic inflammatory response syndrome (SIRS) of non-infectious origin without acute organ dysfunction: Secondary | ICD-10-CM | POA: Diagnosis not present

## 2021-11-06 DIAGNOSIS — D124 Benign neoplasm of descending colon: Secondary | ICD-10-CM | POA: Diagnosis not present

## 2021-11-06 DIAGNOSIS — D472 Monoclonal gammopathy: Secondary | ICD-10-CM | POA: Diagnosis not present

## 2021-11-06 DIAGNOSIS — D123 Benign neoplasm of transverse colon: Secondary | ICD-10-CM | POA: Diagnosis not present

## 2021-11-06 DIAGNOSIS — R768 Other specified abnormal immunological findings in serum: Secondary | ICD-10-CM | POA: Diagnosis not present

## 2021-11-06 DIAGNOSIS — K810 Acute cholecystitis: Secondary | ICD-10-CM | POA: Diagnosis not present

## 2021-11-06 DIAGNOSIS — K851 Biliary acute pancreatitis without necrosis or infection: Secondary | ICD-10-CM | POA: Diagnosis not present

## 2021-11-06 DIAGNOSIS — D649 Anemia, unspecified: Secondary | ICD-10-CM | POA: Diagnosis not present

## 2021-11-20 DIAGNOSIS — D649 Anemia, unspecified: Secondary | ICD-10-CM | POA: Diagnosis not present

## 2021-11-20 DIAGNOSIS — D472 Monoclonal gammopathy: Secondary | ICD-10-CM | POA: Diagnosis not present

## 2021-11-26 NOTE — Telephone Encounter (Signed)
Andrew Humphrey/Andrew Humphrey:  Please cancel October appt.  Do not need to reschedule. Thanks!

## 2021-12-03 ENCOUNTER — Ambulatory Visit: Payer: Medicare PPO | Admitting: Gastroenterology

## 2021-12-09 ENCOUNTER — Other Ambulatory Visit: Payer: Self-pay | Admitting: Student

## 2021-12-10 NOTE — Telephone Encounter (Signed)
Prescription refill request for Eliquis received. Indication:Afib Last office visit:5/23 Scr:0.9 Age: 81 Weight:90.7 kg  Prescription refilled

## 2021-12-11 DIAGNOSIS — Z08 Encounter for follow-up examination after completed treatment for malignant neoplasm: Secondary | ICD-10-CM | POA: Diagnosis not present

## 2021-12-11 DIAGNOSIS — Z85828 Personal history of other malignant neoplasm of skin: Secondary | ICD-10-CM | POA: Diagnosis not present

## 2022-01-03 ENCOUNTER — Ambulatory Visit: Payer: Medicare PPO | Admitting: Cardiology

## 2022-01-19 ENCOUNTER — Other Ambulatory Visit: Payer: Self-pay | Admitting: Student

## 2022-01-20 ENCOUNTER — Other Ambulatory Visit: Payer: Self-pay | Admitting: *Deleted

## 2022-01-20 ENCOUNTER — Other Ambulatory Visit: Payer: Self-pay

## 2022-01-20 MED ORDER — METOPROLOL SUCCINATE ER 100 MG PO TB24: ORAL_TABLET | ORAL | 1 refills | Status: DC

## 2022-02-04 ENCOUNTER — Other Ambulatory Visit: Payer: Self-pay | Admitting: Student

## 2022-02-04 DIAGNOSIS — D649 Anemia, unspecified: Secondary | ICD-10-CM | POA: Diagnosis not present

## 2022-02-04 DIAGNOSIS — Z8673 Personal history of transient ischemic attack (TIA), and cerebral infarction without residual deficits: Secondary | ICD-10-CM | POA: Diagnosis not present

## 2022-02-04 DIAGNOSIS — Z8719 Personal history of other diseases of the digestive system: Secondary | ICD-10-CM | POA: Diagnosis not present

## 2022-02-04 DIAGNOSIS — I10 Essential (primary) hypertension: Secondary | ICD-10-CM | POA: Diagnosis not present

## 2022-02-04 DIAGNOSIS — Z6830 Body mass index (BMI) 30.0-30.9, adult: Secondary | ICD-10-CM | POA: Diagnosis not present

## 2022-02-04 DIAGNOSIS — E1165 Type 2 diabetes mellitus with hyperglycemia: Secondary | ICD-10-CM | POA: Diagnosis not present

## 2022-02-04 DIAGNOSIS — I4891 Unspecified atrial fibrillation: Secondary | ICD-10-CM | POA: Diagnosis not present

## 2022-02-12 NOTE — Progress Notes (Unsigned)
Cardiology Office Note  Date: 02/13/2022   ID: Andrew Humphrey, DOB [birth year], MRN [id number]  PCP:  Lanelle Bal, PA-C  Cardiologist:  Rozann Lesches, MD Electrophysiologist:  None   Chief Complaint  Patient presents with   Cardiac follow-up    History of Present Illness: Andrew Humphrey is a 81 y.o. male last seen in May by Ms. Strader PA-C, I reviewed the note.  He is here today for a routine visit.  Overall doing better with no sense of palpitations, improved shortness of breath, doing some more things around the house compared to 6 months ago.  He does not report any chest pain.  Echocardiogram from May is reviewed below.  I went over his medications.  He reports compliance with therapy, no obvious bleeding problems on Eliquis.  He had a recent visit with his PCP, I reviewed interval lab work.  Past Medical History:  Diagnosis Date   Anemia    Essential hypertension    History of atrial fibrillation    History of GI bleed    Type 2 diabetes mellitus ([place])     Past Surgical History:  Procedure Laterality Date   BIOPSY  07/25/2021   Procedure: BIOPSY;  Surgeon: Jerene Bears, MD;  Location: [facility] ENDOSCOPY;  Service: Gastroenterology;;   CHOLECYSTECTOMY N/A 01/04/2020   Procedure: LAPAROSCOPIC CHOLECYSTECTOMY;  Surgeon: Coralie Keens, MD;  Location: [place];  Service: General;  Laterality: N/A;   COLONOSCOPY N/A 11/09/2016   normal TI, one 3 mm polyp in cecum, three 4-5 mm polyps in mid descending colon, proximal transverse, and proximal ascending. Four adenomas.   COLONOSCOPY WITH PROPOFOL N/A 07/25/2021   palpable rectal nodule 1-2 cm from anal verge, three 3-6 mm in descending, transverse, and ascending colon s/p removal. Moderate diverticulosis in sigmoid, submucosal nodule in distal rectum. Tubular adenomas.   ESOPHAGOGASTRODUODENOSCOPY (EGD) WITH PROPOFOL N/A 07/25/2021   normal mucosa, 3 cm hiatal hernia, gastritis, normal duodenum. Negative H.pylori    GIVENS CAPSULE STUDY  07/2021   negative   POLYPECTOMY  07/25/2021   Procedure: POLYPECTOMY;  Surgeon: Jerene Bears, MD;  Location: [facility] ENDOSCOPY;  Service: Gastroenterology;;   TESTICLE SURGERY     1977   TONSILLECTOMY      Current Outpatient Medications  Medication Sig Dispense Refill   acetaminophen (TYLENOL) 325 MG tablet Take 2 tablets (650 mg total) by mouth every 6 (six) hours as needed for mild pain (or Fever >/= 101).     amiodarone (PACERONE) 200 MG tablet Take 0.5 tablets (100 mg total) by mouth daily. 45 tablet 3   amLODipine (NORVASC) 10 MG tablet Take by mouth.     apixaban (ELIQUIS) 5 MG TABS tablet Take 1 tablet by mouth twice daily 60 tablet 5   Cholecalciferol (VITAMIN D-3) 25 MCG (1000 UT) CAPS Take by mouth.     Cyanocobalamin (VITAMIN B 12 PO) Take 1,000 mcg by mouth.     ferrous sulfate 325 (65 FE) MG tablet Take 325 mg by mouth daily with breakfast.     furosemide (LASIX) 20 MG tablet Take 20 mg by mouth daily as needed.     glipiZIDE (GLUCOTROL XL) 10 MG 24 hr tablet Take 20 mg by mouth in the morning and at bedtime.     levothyroxine (SYNTHROID) 25 MCG tablet Take 25 mcg by mouth daily.     metFORMIN (GLUCOPHAGE) 1000 MG tablet Take 1,000 mg by mouth daily with breakfast.     metoprolol succinate (TOPROL-XL) 100  MG 24 hr tablet TAKE 1 TABLET BY MOUTH ONCE DAILY WITH  OR  IMMEDIATELY  FOLLOWING  A  MEAL 90 tablet 1   Multiple Vitamins-Minerals (MULTIVITAMIN WITH MINERALS) tablet Take 1 tablet by mouth daily.     pantoprazole (PROTONIX) 40 MG tablet Take 1 tablet (40 mg total) by mouth daily. 30 tablet 0   No current facility-administered medications for this visit.   Allergies:  Patient has no known allergies.   ROS: No syncope.  Physical Exam: VS:  BP (!) 149/90   Pulse 70   Ht '5\' 7"'$  (1.702 m)   Wt 194 lb 9.6 oz (88.3 kg)   SpO2 98%   BMI 30.48 kg/m , BMI Body mass index is 30.48 kg/m.  Wt Readings from Last 3 Encounters:  02/13/22 194 lb 9.6 oz  (88.3 kg)  08/14/21 199 lb 14.4 oz (90.7 kg)  07/26/21 191 lb 12.8 oz (87 kg)    General: Patient appears comfortable at rest. HEENT: Conjunctiva and lids normal. Neck: Supple, no elevated JVP or carotid bruits. Lungs: Coarse breath sounds without wheezing. Cardiac: Regular rate and rhythm, no S3, 2/6 systolic murmur. Extremities: No pitting edema.  ECG:  An ECG dated 07/23/2021 was personally reviewed today and demonstrated:  Sinus rhythm with prolonged PR interval and leftward axis.  Recent Labwork: 07/23/2021: B Natriuretic Peptide 68.3 07/24/2021: Magnesium 1.8 07/25/2021: ALT 73; AST 95; BUN 7; Creatinine, Ser 0.95; Potassium 3.7; Sodium 136 07/26/2021: Hemoglobin 8.7; Platelets 184  September 2023: TSH 5.20 February 2022: BUN 13, creatinine 0.88, potassium 4.7, AST 44, ALT 40, cholesterol 161, triglycerides 151, HDL 40, LDL 94, hemoglobin A1c 7.7%  Other Studies Reviewed Today:  Echocardiogram 07/24/2021:  1. Left ventricular ejection fraction, by estimation, is 60 to 65%. The  left ventricle has normal function. The left ventricle has no regional  wall motion abnormalities. There is mild concentric left ventricular  hypertrophy. Left ventricular diastolic  parameters are consistent with Grade I diastolic dysfunction (impaired  relaxation). Elevated left ventricular end-diastolic pressure.   2. Right ventricular systolic function is normal. The right ventricular  size is normal. There is normal pulmonary artery systolic pressure.   3. Left atrial size was moderately dilated.   4. The mitral valve is normal in structure. Trivial mitral valve  regurgitation. No evidence of mitral stenosis. Moderate mitral annular  calcification.   5. The aortic valve is tricuspid. There is mild calcification of the  aortic valve. There is mild thickening of the aortic valve. Aortic valve  regurgitation is mild. Mild aortic valve stenosis. Aortic valve area, by  VTI measures 1.66 cm. Aortic valve   mean gradient measures 12.0 mmHg. Aortic valve Vmax measures 2.35 m/s.   6. Aortic dilatation noted. There is mild dilatation of the aortic root,  measuring 36 mm.   7. The inferior vena cava is normal in size with greater than 50%  respiratory variability, suggesting right atrial pressure of 3 mmHg.   Assessment and Plan:  1.  Paroxysmal atrial fibrillation with CHA2DS2-VASc score of 4.  He is asymptomatic in terms of palpitations, heart rate regular today on examination.  Continues on low-dose amiodarone for rhythm suppression, also Toprol-XL.  He is tolerating Eliquis for stroke prophylaxis, no obvious interval bleeding problems.  I reviewed his recent lab work.  2.  Essential hypertension, continues to follow with PCP and is on Norvasc in addition to Toprol-XL.  Medication Adjustments/Labs and Tests Ordered: Current medicines are reviewed at length with the patient  today.  Concerns regarding medicines are outlined above.   Tests Ordered: No orders of the defined types were placed in this encounter.   Medication Changes: No orders of the defined types were placed in this encounter.   Disposition:  Follow up  6 months.  Signed, Satira Sark, MD, [facility] 02/13/2022 10:19 AM    [facility] [place]. [address], [place], [place] [address] Phone: [phone]; Fax: [phone]

## 2022-02-13 ENCOUNTER — Encounter: Payer: Self-pay | Admitting: Cardiology

## 2022-02-13 ENCOUNTER — Ambulatory Visit: Payer: Medicare PPO | Attending: Cardiology | Admitting: Cardiology

## 2022-02-13 VITALS — BP 149/90 | HR 70 | Ht 67.0 in | Wt 194.6 lb

## 2022-02-13 DIAGNOSIS — I480 Paroxysmal atrial fibrillation: Secondary | ICD-10-CM

## 2022-02-13 DIAGNOSIS — I10 Essential (primary) hypertension: Secondary | ICD-10-CM

## 2022-02-13 NOTE — Patient Instructions (Signed)
Medication Instructions:  Your physician recommends that you continue on your current medications as directed. Please refer to the Current Medication list given to you today.   Labwork: None today  Testing/Procedures: None today  Follow-Up: 6 months  Any Other Special Instructions Will Be Listed Below (If Applicable).  If you need a refill on your cardiac medications before your next appointment, please call your pharmacy.  

## 2022-02-14 DIAGNOSIS — R030 Elevated blood-pressure reading, without diagnosis of hypertension: Secondary | ICD-10-CM | POA: Diagnosis not present

## 2022-02-14 DIAGNOSIS — H109 Unspecified conjunctivitis: Secondary | ICD-10-CM | POA: Diagnosis not present

## 2022-02-14 DIAGNOSIS — Z6830 Body mass index (BMI) 30.0-30.9, adult: Secondary | ICD-10-CM | POA: Diagnosis not present

## 2022-02-19 DIAGNOSIS — R195 Other fecal abnormalities: Secondary | ICD-10-CM | POA: Diagnosis not present

## 2022-02-19 DIAGNOSIS — D472 Monoclonal gammopathy: Secondary | ICD-10-CM | POA: Diagnosis not present

## 2022-02-19 DIAGNOSIS — D62 Acute posthemorrhagic anemia: Secondary | ICD-10-CM | POA: Diagnosis not present

## 2022-02-19 DIAGNOSIS — D649 Anemia, unspecified: Secondary | ICD-10-CM | POA: Diagnosis not present

## 2022-02-26 DIAGNOSIS — D472 Monoclonal gammopathy: Secondary | ICD-10-CM | POA: Diagnosis not present

## 2022-02-26 DIAGNOSIS — H01005 Unspecified blepharitis left lower eyelid: Secondary | ICD-10-CM | POA: Diagnosis not present

## 2022-02-26 DIAGNOSIS — H18892 Other specified disorders of cornea, left eye: Secondary | ICD-10-CM | POA: Diagnosis not present

## 2022-02-26 DIAGNOSIS — D649 Anemia, unspecified: Secondary | ICD-10-CM | POA: Diagnosis not present

## 2022-02-26 DIAGNOSIS — H01002 Unspecified blepharitis right lower eyelid: Secondary | ICD-10-CM | POA: Diagnosis not present

## 2022-03-03 DIAGNOSIS — H18892 Other specified disorders of cornea, left eye: Secondary | ICD-10-CM | POA: Diagnosis not present

## 2022-04-07 DIAGNOSIS — H04123 Dry eye syndrome of bilateral lacrimal glands: Secondary | ICD-10-CM | POA: Diagnosis not present

## 2022-05-27 ENCOUNTER — Other Ambulatory Visit: Payer: Self-pay | Admitting: *Deleted

## 2022-05-27 MED ORDER — APIXABAN 5 MG PO TABS: 5.0000 mg | ORAL_TABLET | Freq: Two times a day (BID) | ORAL | 5 refills | Status: DC

## 2022-05-27 NOTE — Telephone Encounter (Signed)
Prescription refill request for Eliquis received. Indication: PAF Last office visit: 02/13/22  Myles Gip MD Scr: 1.04 on 02/19/22  Epic Age: 82 Weight: 88.3kg  Based on above findings Eliquis 5mg  twice daily is the appropriate dose.  Refill approved.

## 2022-06-04 DIAGNOSIS — I10 Essential (primary) hypertension: Secondary | ICD-10-CM | POA: Diagnosis not present

## 2022-06-04 DIAGNOSIS — Z8719 Personal history of other diseases of the digestive system: Secondary | ICD-10-CM | POA: Diagnosis not present

## 2022-06-04 DIAGNOSIS — Z8673 Personal history of transient ischemic attack (TIA), and cerebral infarction without residual deficits: Secondary | ICD-10-CM | POA: Diagnosis not present

## 2022-06-04 DIAGNOSIS — D649 Anemia, unspecified: Secondary | ICD-10-CM | POA: Diagnosis not present

## 2022-06-04 DIAGNOSIS — I4891 Unspecified atrial fibrillation: Secondary | ICD-10-CM | POA: Diagnosis not present

## 2022-06-04 DIAGNOSIS — Z6830 Body mass index (BMI) 30.0-30.9, adult: Secondary | ICD-10-CM | POA: Diagnosis not present

## 2022-06-04 DIAGNOSIS — E1165 Type 2 diabetes mellitus with hyperglycemia: Secondary | ICD-10-CM | POA: Diagnosis not present

## 2022-06-23 ENCOUNTER — Encounter (HOSPITAL_COMMUNITY): Payer: Self-pay | Admitting: Emergency Medicine

## 2022-06-23 ENCOUNTER — Emergency Department (HOSPITAL_COMMUNITY): Payer: Medicare PPO

## 2022-06-23 ENCOUNTER — Inpatient Hospital Stay (HOSPITAL_COMMUNITY)
Admission: EM | Admit: 2022-06-23 | Discharge: 2022-07-26 | DRG: 193 | Disposition: E | Payer: Medicare PPO | Attending: Internal Medicine | Admitting: Internal Medicine

## 2022-06-23 ENCOUNTER — Other Ambulatory Visit: Payer: Self-pay

## 2022-06-23 DIAGNOSIS — Z8249 Family history of ischemic heart disease and other diseases of the circulatory system: Secondary | ICD-10-CM | POA: Diagnosis not present

## 2022-06-23 DIAGNOSIS — I480 Paroxysmal atrial fibrillation: Secondary | ICD-10-CM | POA: Diagnosis not present

## 2022-06-23 DIAGNOSIS — K219 Gastro-esophageal reflux disease without esophagitis: Secondary | ICD-10-CM | POA: Diagnosis present

## 2022-06-23 DIAGNOSIS — Z66 Do not resuscitate: Secondary | ICD-10-CM | POA: Diagnosis not present

## 2022-06-23 DIAGNOSIS — I10 Essential (primary) hypertension: Secondary | ICD-10-CM | POA: Diagnosis not present

## 2022-06-23 DIAGNOSIS — R451 Restlessness and agitation: Secondary | ICD-10-CM | POA: Diagnosis not present

## 2022-06-23 DIAGNOSIS — Z7984 Long term (current) use of oral hypoglycemic drugs: Secondary | ICD-10-CM | POA: Diagnosis not present

## 2022-06-23 DIAGNOSIS — R079 Chest pain, unspecified: Secondary | ICD-10-CM | POA: Diagnosis not present

## 2022-06-23 DIAGNOSIS — Z79890 Hormone replacement therapy (postmenopausal): Secondary | ICD-10-CM

## 2022-06-23 DIAGNOSIS — J849 Interstitial pulmonary disease, unspecified: Secondary | ICD-10-CM | POA: Diagnosis present

## 2022-06-23 DIAGNOSIS — Z515 Encounter for palliative care: Secondary | ICD-10-CM

## 2022-06-23 DIAGNOSIS — E039 Hypothyroidism, unspecified: Secondary | ICD-10-CM | POA: Diagnosis not present

## 2022-06-23 DIAGNOSIS — J189 Pneumonia, unspecified organism: Principal | ICD-10-CM

## 2022-06-23 DIAGNOSIS — Z1152 Encounter for screening for COVID-19: Secondary | ICD-10-CM

## 2022-06-23 DIAGNOSIS — Z79899 Other long term (current) drug therapy: Secondary | ICD-10-CM

## 2022-06-23 DIAGNOSIS — J9601 Acute respiratory failure with hypoxia: Secondary | ICD-10-CM | POA: Diagnosis present

## 2022-06-23 DIAGNOSIS — E876 Hypokalemia: Secondary | ICD-10-CM | POA: Diagnosis present

## 2022-06-23 DIAGNOSIS — Z825 Family history of asthma and other chronic lower respiratory diseases: Secondary | ICD-10-CM | POA: Diagnosis not present

## 2022-06-23 DIAGNOSIS — Z720 Tobacco use: Secondary | ICD-10-CM | POA: Diagnosis not present

## 2022-06-23 DIAGNOSIS — I4891 Unspecified atrial fibrillation: Secondary | ICD-10-CM | POA: Diagnosis present

## 2022-06-23 DIAGNOSIS — Z7901 Long term (current) use of anticoagulants: Secondary | ICD-10-CM

## 2022-06-23 DIAGNOSIS — Z9049 Acquired absence of other specified parts of digestive tract: Secondary | ICD-10-CM

## 2022-06-23 DIAGNOSIS — J8410 Pulmonary fibrosis, unspecified: Secondary | ICD-10-CM | POA: Diagnosis not present

## 2022-06-23 DIAGNOSIS — R5383 Other fatigue: Secondary | ICD-10-CM | POA: Diagnosis present

## 2022-06-23 DIAGNOSIS — J9690 Respiratory failure, unspecified, unspecified whether with hypoxia or hypercapnia: Secondary | ICD-10-CM | POA: Diagnosis not present

## 2022-06-23 DIAGNOSIS — R0602 Shortness of breath: Secondary | ICD-10-CM | POA: Diagnosis not present

## 2022-06-23 DIAGNOSIS — E119 Type 2 diabetes mellitus without complications: Secondary | ICD-10-CM | POA: Diagnosis not present

## 2022-06-23 LAB — HEPATIC FUNCTION PANEL
ALT: 14 U/L (ref 0–44)
AST: 32 U/L (ref 15–41)
Albumin: 3.1 g/dL — ABNORMAL LOW (ref 3.5–5.0)
Alkaline Phosphatase: 47 U/L (ref 38–126)
Bilirubin, Direct: 0.3 mg/dL — ABNORMAL HIGH (ref 0.0–0.2)
Indirect Bilirubin: 1 mg/dL — ABNORMAL HIGH (ref 0.3–0.9)
Total Bilirubin: 1.3 mg/dL — ABNORMAL HIGH (ref 0.3–1.2)
Total Protein: 7.7 g/dL (ref 6.5–8.1)

## 2022-06-23 LAB — CULTURE, BLOOD (ROUTINE X 2): Special Requests: ADEQUATE

## 2022-06-23 LAB — PROTIME-INR
INR: 1.4 — ABNORMAL HIGH (ref 0.8–1.2)
Prothrombin Time: 16.8 seconds — ABNORMAL HIGH (ref 11.4–15.2)

## 2022-06-23 LAB — RESP PANEL BY RT-PCR (RSV, FLU A&B, COVID)  RVPGX2
Influenza A by PCR: NEGATIVE
Influenza B by PCR: NEGATIVE
Resp Syncytial Virus by PCR: NEGATIVE
SARS Coronavirus 2 by RT PCR: NEGATIVE

## 2022-06-23 LAB — BLOOD GAS, VENOUS
Acid-base deficit: 3.1 mmol/L — ABNORMAL HIGH (ref 0.0–2.0)
Bicarbonate: 21.1 mmol/L (ref 20.0–28.0)
Drawn by: 42924
O2 Saturation: 65.3 %
Patient temperature: 36.5
pCO2, Ven: 33 mmHg — ABNORMAL LOW (ref 44–60)
pH, Ven: 7.41 (ref 7.25–7.43)
pO2, Ven: 38 mmHg (ref 32–45)

## 2022-06-23 LAB — MRSA NEXT GEN BY PCR, NASAL: MRSA by PCR Next Gen: NOT DETECTED

## 2022-06-23 LAB — BASIC METABOLIC PANEL
Anion gap: 14 (ref 5–15)
BUN: 12 mg/dL (ref 8–23)
CO2: 19 mmol/L — ABNORMAL LOW (ref 22–32)
Calcium: 8.5 mg/dL — ABNORMAL LOW (ref 8.9–10.3)
Chloride: 99 mmol/L (ref 98–111)
Creatinine, Ser: 0.99 mg/dL (ref 0.61–1.24)
GFR, Estimated: >60 mL/min (ref >60)
Glucose, Bld: 149 mg/dL — ABNORMAL HIGH (ref 70–99)
Potassium: 3.2 mmol/L — ABNORMAL LOW (ref 3.5–5.1)
Sodium: 132 mmol/L — ABNORMAL LOW (ref 135–145)

## 2022-06-23 LAB — TSH: TSH: 5.391 u[IU]/mL — ABNORMAL HIGH (ref 0.350–4.500)

## 2022-06-23 LAB — TROPONIN I (HIGH SENSITIVITY)
Troponin I (High Sensitivity): 8 ng/L (ref <18)
Troponin I (High Sensitivity): 6 ng/L (ref <18)

## 2022-06-23 LAB — PHOSPHORUS: Phosphorus: 3 mg/dL (ref 2.5–4.6)

## 2022-06-23 LAB — CBC
HCT: 38.4 % — ABNORMAL LOW (ref 39.0–52.0)
Hemoglobin: 12.6 g/dL — ABNORMAL LOW (ref 13.0–17.0)
MCH: 32.2 pg (ref 26.0–34.0)
MCHC: 32.8 g/dL (ref 30.0–36.0)
MCV: 98.2 fL (ref 80.0–100.0)
Platelets: 229 10*3/uL (ref 150–400)
RBC: 3.91 MIL/uL — ABNORMAL LOW (ref 4.22–5.81)
RDW: 23.4 % — ABNORMAL HIGH (ref 11.5–15.5)
WBC: 17.6 10*3/uL — ABNORMAL HIGH (ref 4.0–10.5)
nRBC: 0.1 % (ref 0.0–0.2)

## 2022-06-23 LAB — HEMOGLOBIN A1C
Hgb A1c MFr Bld: 6.5 % — ABNORMAL HIGH (ref 4.8–5.6)
Mean Plasma Glucose: 139.85 mg/dL

## 2022-06-23 LAB — MAGNESIUM: Magnesium: 1.7 mg/dL (ref 1.7–2.4)

## 2022-06-23 LAB — D-DIMER, QUANTITATIVE: D-Dimer, Quant: 0.74 ug/mL-FEU — ABNORMAL HIGH (ref 0.00–0.50)

## 2022-06-23 LAB — GLUCOSE, CAPILLARY
Glucose-Capillary: 148 mg/dL — ABNORMAL HIGH (ref 70–99)
Glucose-Capillary: 211 mg/dL — ABNORMAL HIGH (ref 70–99)

## 2022-06-23 LAB — BRAIN NATRIURETIC PEPTIDE: B Natriuretic Peptide: 212 pg/mL — ABNORMAL HIGH (ref 0.0–100.0)

## 2022-06-23 MED ORDER — METHYLPREDNISOLONE SODIUM SUCC 125 MG IJ SOLR
125.0000 mg | Freq: Once | INTRAMUSCULAR | Status: AC
  Administered 2022-06-23: 125 mg via INTRAVENOUS
  Filled 2022-06-23: qty 2

## 2022-06-23 MED ORDER — SODIUM CHLORIDE 0.9% FLUSH
3.0000 mL | Freq: Once | INTRAVENOUS | Status: AC
  Administered 2022-06-23: 3 mL via INTRAVENOUS

## 2022-06-23 MED ORDER — LEVOTHYROXINE SODIUM 25 MCG PO TABS
25.0000 ug | ORAL_TABLET | Freq: Every day | ORAL | Status: DC
  Administered 2022-06-25: 25 ug via ORAL
  Filled 2022-06-23: qty 1

## 2022-06-23 MED ORDER — IPRATROPIUM-ALBUTEROL 0.5-2.5 (3) MG/3ML IN SOLN
3.0000 mL | Freq: Three times a day (TID) | RESPIRATORY_TRACT | Status: DC
  Administered 2022-06-23: 3 mL via RESPIRATORY_TRACT

## 2022-06-23 MED ORDER — SODIUM CHLORIDE 0.9 % IV SOLN
500.0000 mg | INTRAVENOUS | Status: DC
  Administered 2022-06-24 – 2022-06-25 (×2): 500 mg via INTRAVENOUS
  Filled 2022-06-23 (×2): qty 5

## 2022-06-23 MED ORDER — SODIUM CHLORIDE 0.9 % IV SOLN
1.0000 g | Freq: Once | INTRAVENOUS | Status: AC
  Administered 2022-06-23: 1 g via INTRAVENOUS
  Filled 2022-06-23: qty 10

## 2022-06-23 MED ORDER — METHYLPREDNISOLONE SODIUM SUCC 40 MG IJ SOLR
40.0000 mg | Freq: Two times a day (BID) | INTRAMUSCULAR | Status: DC
  Administered 2022-06-23 – 2022-06-25 (×5): 40 mg via INTRAVENOUS
  Filled 2022-06-23 (×5): qty 1

## 2022-06-23 MED ORDER — METOPROLOL SUCCINATE ER 50 MG PO TB24
100.0000 mg | ORAL_TABLET | Freq: Every day | ORAL | Status: DC
  Administered 2022-06-24 – 2022-06-25 (×2): 100 mg via ORAL
  Filled 2022-06-23 (×2): qty 2

## 2022-06-23 MED ORDER — PANTOPRAZOLE SODIUM 40 MG PO TBEC
40.0000 mg | DELAYED_RELEASE_TABLET | Freq: Every day | ORAL | Status: DC
  Administered 2022-06-24 – 2022-06-25 (×2): 40 mg via ORAL
  Filled 2022-06-23 (×2): qty 1

## 2022-06-23 MED ORDER — AMIODARONE HCL 200 MG PO TABS
100.0000 mg | ORAL_TABLET | Freq: Every day | ORAL | Status: DC
  Administered 2022-06-24 – 2022-06-25 (×2): 100 mg via ORAL
  Filled 2022-06-23 (×2): qty 1

## 2022-06-23 MED ORDER — INSULIN ASPART 100 UNIT/ML IJ SOLN
0.0000 [IU] | Freq: Every day | INTRAMUSCULAR | Status: DC
  Administered 2022-06-23 – 2022-06-24 (×2): 2 [IU] via SUBCUTANEOUS

## 2022-06-23 MED ORDER — ACETAMINOPHEN 325 MG PO TABS: 650.0000 mg | ORAL_TABLET | Freq: Four times a day (QID) | ORAL | Status: DC | PRN

## 2022-06-23 MED ORDER — FUROSEMIDE 10 MG/ML IJ SOLN
20.0000 mg | Freq: Once | INTRAMUSCULAR | Status: AC
  Administered 2022-06-23: 20 mg via INTRAVENOUS
  Filled 2022-06-23: qty 2

## 2022-06-23 MED ORDER — SODIUM CHLORIDE 0.9% FLUSH
3.0000 mL | Freq: Two times a day (BID) | INTRAVENOUS | Status: DC
  Administered 2022-06-23 – 2022-06-25 (×6): 3 mL via INTRAVENOUS

## 2022-06-23 MED ORDER — VITAMIN D 25 MCG (1000 UNIT) PO TABS
1000.0000 [IU] | ORAL_TABLET | ORAL | Status: DC
  Administered 2022-06-24: 1000 [IU] via ORAL
  Filled 2022-06-23: qty 1

## 2022-06-23 MED ORDER — POTASSIUM CHLORIDE 10 MEQ/100ML IV SOLN
10.0000 meq | INTRAVENOUS | Status: AC
  Administered 2022-06-23 (×3): 10 meq via INTRAVENOUS
  Filled 2022-06-23 (×3): qty 100

## 2022-06-23 MED ORDER — SODIUM CHLORIDE 0.9 % IV SOLN
2.0000 g | INTRAVENOUS | Status: DC
  Administered 2022-06-24 – 2022-06-25 (×2): 2 g via INTRAVENOUS
  Filled 2022-06-23 (×2): qty 20

## 2022-06-23 MED ORDER — CHLORHEXIDINE GLUCONATE CLOTH 2 % EX PADS
6.0000 | MEDICATED_PAD | Freq: Every day | CUTANEOUS | Status: DC
  Administered 2022-06-23 – 2022-06-25 (×3): 6 via TOPICAL

## 2022-06-23 MED ORDER — SODIUM CHLORIDE 0.9 % IV SOLN
500.0000 mg | Freq: Once | INTRAVENOUS | Status: AC
  Administered 2022-06-23: 500 mg via INTRAVENOUS
  Filled 2022-06-23: qty 5

## 2022-06-23 MED ORDER — INSULIN ASPART 100 UNIT/ML IJ SOLN
0.0000 [IU] | Freq: Three times a day (TID) | INTRAMUSCULAR | Status: DC
  Administered 2022-06-23: 1 [IU] via SUBCUTANEOUS
  Administered 2022-06-24: 5 [IU] via SUBCUTANEOUS
  Administered 2022-06-24: 2 [IU] via SUBCUTANEOUS
  Administered 2022-06-24: 5 [IU] via SUBCUTANEOUS
  Administered 2022-06-25: 3 [IU] via SUBCUTANEOUS
  Administered 2022-06-25 (×2): 5 [IU] via SUBCUTANEOUS

## 2022-06-23 MED ORDER — IOHEXOL 350 MG/ML SOLN
100.0000 mL | Freq: Once | INTRAVENOUS | Status: AC | PRN
  Administered 2022-06-23: 75 mL via INTRAVENOUS

## 2022-06-23 MED ORDER — ONDANSETRON HCL 4 MG PO TABS: 4.0000 mg | ORAL_TABLET | Freq: Four times a day (QID) | ORAL | Status: DC | PRN

## 2022-06-23 MED ORDER — IPRATROPIUM-ALBUTEROL 0.5-2.5 (3) MG/3ML IN SOLN
RESPIRATORY_TRACT | Status: AC
  Filled 2022-06-23: qty 3

## 2022-06-23 MED ORDER — BUDESONIDE 0.5 MG/2ML IN SUSP
0.5000 mg | Freq: Two times a day (BID) | RESPIRATORY_TRACT | Status: DC
  Administered 2022-06-23 – 2022-06-25 (×5): 0.5 mg via RESPIRATORY_TRACT
  Filled 2022-06-23 (×5): qty 2

## 2022-06-23 MED ORDER — FUROSEMIDE 10 MG/ML IJ SOLN
20.0000 mg | Freq: Every day | INTRAMUSCULAR | Status: DC
  Administered 2022-06-23 – 2022-06-25 (×3): 20 mg via INTRAVENOUS
  Filled 2022-06-23 (×3): qty 2

## 2022-06-23 MED ORDER — IPRATROPIUM-ALBUTEROL 0.5-2.5 (3) MG/3ML IN SOLN
3.0000 mL | Freq: Four times a day (QID) | RESPIRATORY_TRACT | Status: DC
  Administered 2022-06-23 – 2022-06-25 (×9): 3 mL via RESPIRATORY_TRACT
  Filled 2022-06-23 (×8): qty 3

## 2022-06-23 MED ORDER — APIXABAN 5 MG PO TABS
5.0000 mg | ORAL_TABLET | Freq: Two times a day (BID) | ORAL | Status: DC
  Administered 2022-06-24 – 2022-06-25 (×3): 5 mg via ORAL
  Filled 2022-06-23 (×3): qty 1

## 2022-06-23 MED ORDER — SODIUM CHLORIDE 0.9% FLUSH: 3.0000 mL | INTRAVENOUS | Status: DC | PRN

## 2022-06-23 MED ORDER — SODIUM CHLORIDE 0.9 % IV SOLN: 250.0000 mL | INTRAVENOUS | Status: DC | PRN

## 2022-06-23 MED ORDER — ACETAMINOPHEN 650 MG RE SUPP: 650.0000 mg | Freq: Four times a day (QID) | RECTAL | Status: DC | PRN

## 2022-06-23 MED ORDER — ONDANSETRON HCL 4 MG/2ML IJ SOLN
4.0000 mg | Freq: Four times a day (QID) | INTRAMUSCULAR | Status: DC | PRN
  Administered 2022-06-24: 4 mg via INTRAVENOUS
  Filled 2022-06-23: qty 2

## 2022-06-23 NOTE — H&P (Signed)
History and Physical    Patient: Andrew Humphrey:[id number] DOB: [birth year] DOA: 06/03/2022 DOS: the patient was seen and examined on 06/07/2022 PCP: Lianne Moris, PA-C  Patient coming from: Home  Chief Complaint:  Chief Complaint  Patient presents with   Shortness of Breath   HPI: Andrew Humphrey is a 82 y.o. male with medical history significant of hypertension, history of atrial fibrillation (on chronic Eliquis), type 2 diabetes without complications, history of GI bleed/GERD and hypothyroidism; who presented to the hospital secondary to shortness of breath and coughing spells.  Patient's symptoms have been present for the last 3 days and worsening; at time of presentation to the ED symptoms were so severe that he was not able to ambulate long distances.  Patient reported associated slightly productive coughing spells and feeling wheezy.    Patient did not wear oxygen supplementation at home and on arrival saturation was in the mid 60s range.  Patient denies chest pain, nausea, vomiting, focal weaknesses, dysuria, hematuria, melena/hematochezia or abdominal pain.  Workup in the ED demonstrating positive bilateral pneumonia and changes suggesting underlying interstitial lung disease/pulmonary fibrosis.  Cultures taken, IV antibiotics along with nebulizer management and steroids started.  TRH contacted to place patient in the hospital for further evaluation and management.  Review of Systems: As mentioned in the history of present illness. All other systems reviewed and are negative. Past Medical History:  Diagnosis Date   Anemia    Essential hypertension    History of atrial fibrillation    History of GI bleed    Type 2 diabetes mellitus (HCC)    Past Surgical History:  Procedure Laterality Date   BIOPSY  07/25/2021   Procedure: BIOPSY;  Surgeon: Beverley Fiedler, MD;  Location: [facility] ENDOSCOPY;  Service: Gastroenterology;;   CHOLECYSTECTOMY N/A 01/04/2020   Procedure:  LAPAROSCOPIC CHOLECYSTECTOMY;  Surgeon: Abigail Miyamoto, MD;  Location: [facility] OR;  Service: General;  Laterality: N/A;   COLONOSCOPY N/A 11/09/2016   normal TI, one 3 mm polyp in cecum, three 4-5 mm polyps in mid descending colon, proximal transverse, and proximal ascending. Four adenomas.   COLONOSCOPY WITH PROPOFOL N/A 07/25/2021   palpable rectal nodule 1-2 cm from anal verge, three 3-6 mm in descending, transverse, and ascending colon s/p removal. Moderate diverticulosis in sigmoid, submucosal nodule in distal rectum. Tubular adenomas.   ESOPHAGOGASTRODUODENOSCOPY (EGD) WITH PROPOFOL N/A 07/25/2021   normal mucosa, 3 cm hiatal hernia, gastritis, normal duodenum. Negative H.pylori   GIVENS CAPSULE STUDY  07/2021   negative   POLYPECTOMY  07/25/2021   Procedure: POLYPECTOMY;  Surgeon: Beverley Fiedler, MD;  Location: [facility] ENDOSCOPY;  Service: Gastroenterology;;   TESTICLE SURGERY     1977   TONSILLECTOMY     Social History:  reports that he has never smoked. His smokeless tobacco use includes chew. He reports current alcohol use. He reports that he does not use drugs.  No Known Allergies  Family History  Problem Relation Age of Onset   Hypertension Father    Colon cancer Father    COPD Sister    Hypertension Brother    Colon polyps Neg Hx     Prior to Admission medications   Medication Sig Start Date End Date Taking? Authorizing Provider  acetaminophen (TYLENOL) 325 MG tablet Take 2 tablets (650 mg total) by mouth every 6 (six) hours as needed for mild pain (or Fever >/= 101). 01/08/20  Yes Zannie Cove, MD  amiodarone (PACERONE) 200 MG tablet Take 0.5 tablets (100  mg total) by mouth daily. 06/26/21  Yes Strader, [place] M, PA-C  amLODipine (NORVASC) 10 MG tablet Take 10 mg by mouth daily. 06/04/21  Yes [provider]  apixaban (ELIQUIS) 5 MG TABS tablet Take 1 tablet (5 mg total) by mouth 2 (two) times daily. 05/27/22  Yes Jonelle Sidle, MD  Cholecalciferol (VITAMIN D-3)  25 MCG (1000 UT) CAPS Take 1,000 Units by mouth every other day.   Yes [provider]  Cyanocobalamin (VITAMIN B 12 PO) Take 1,000 mcg by mouth daily.   Yes [provider]  ferrous sulfate 325 (65 FE) MG tablet Take 325 mg by mouth 2 (two) times daily with a meal.   Yes [provider]  furosemide (LASIX) 20 MG tablet Take 20 mg by mouth daily as needed. 08/05/21  Yes [provider]  glipiZIDE (GLUCOTROL XL) 10 MG 24 hr tablet Take 10 mg by mouth at bedtime. 11/21/19  Yes [provider]  JANUVIA 25 MG tablet Take 25 mg by mouth daily. 06/05/22  Yes [provider]  levothyroxine (SYNTHROID) 25 MCG tablet Take 25 mcg by mouth at bedtime. 08/06/21  Yes [provider]  metFORMIN (GLUCOPHAGE) 1000 MG tablet Take 1,000 mg by mouth at bedtime. 06/12/17  Yes [provider]  metoprolol succinate (TOPROL-XL) 100 MG 24 hr tablet TAKE 1 TABLET BY MOUTH ONCE DAILY WITH  OR  IMMEDIATELY  FOLLOWING  A  MEAL 01/20/22  Yes Jonelle Sidle, MD  Multiple Vitamins-Minerals (MULTIVITAMIN WITH MINERALS) tablet Take 1 tablet by mouth daily.   Yes [provider]  pantoprazole (PROTONIX) 40 MG tablet Take 1 tablet (40 mg total) by mouth daily. 07/26/21 05/29/2022 Yes Evlyn Kanner, MD    Physical Exam: Vitals:   06/19/2022 1500 05/31/2022 1532 05/30/2022 1633 06/10/2022 1634  BP: 119/69 116/63  (!) 144/75  Pulse: 81   81  Resp: (!) 40 (!) 36  (!) 34  Temp:  97.9 F (36.6 C) 98.9 F (37.2 C)   TempSrc:  Axillary Axillary   SpO2: 93%   99%  Weight:  81.3 kg 81.3 kg   Height:  6' (1.829 m) 6' (1.829 m)    General exam: Alert, awake, oriented x 3, chronically ill in appearance, expressing feeling weak and tired.  No chest pain. Respiratory system: Positive tachypnea, rhonchi bilaterally appreciated.  No crackles, mild end expiratory wheezing on exam. Cardiovascular system: Rate controlled, no rubs, no gallops, no JVD. Gastrointestinal  system: Abdomen is nondistended, soft and nontender. No organomegaly or masses felt. Normal bowel sounds heard. Central nervous system: Alert and oriented. No focal neurological deficits. Extremities/skin: No cyanosis or clubbing, no petechiae.  No rashes or open wounds appreciated on exam. Psychiatry: Judgement and insight appear normal. Mood & affect appropriate.   Data Reviewed: CBC: White blood cells 17.6, hemoglobin 12.6 and platelet count 229 K Basic metabolic panel: Sodium 132, potassium 3.2, chloride 99, bicarb 19, glucose 149, BUN 12, creatinine 0.99 and GFR > 60 Respiratory panel: Negative for COVID, influenza and RSV D-dimer: 0.74 BNP 212 Magnesium: 1.7 Chest x-ray: Decreasing interstitial lung parenchymal opacities compared to prior exam.  Possible acute respiratory process. CT angiogram of the chest: No evidence of pulmonary embolism, dilated central pulmonary arteries consistent with pulmonary hypertension, superimposed on previously visualized mild chronic interstitial lung disease is new widespread groundglass airspace opacity throughout both lungs, slightly worse in the right lung.  There is also worsening interstitial disease with potential fibrotic disease in the periphery  of both lungs.  Assessment and Plan: * Acute hypoxic respiratory failure (HCC) -Acute hypoxic respiratory failure with oxygen saturation in the 60 range on room air. -Patient requiring BiPAP support to stabilize saturation. -Acute community-acquired pneumonia on top of acute on chronic ILD/pulmonary fibrosis exacerbation most likely responsible for his hypoxemic process. -BNP minimally elevated and no crackles or significant fluid overload findings on exam. -Continue steroids, antibiotics, mucolytic's, bronchodilator/nebulizer management and supportive care. -Wean off BiPAP and oxygen supplementation as tolerated. -Follow clinical response.  GERD (gastroesophageal reflux disease) -Continue  PPI.  Hypothyroidism -Check TSH -Continue Synthroid  Pulmonary fibrosis (HCC) -Chest images suggesting the presence of interstitial lung disease and pulmonary fibrosis -Will provide treatment with bronchodilator management, steroids and supportive care -Per chart review no evaluation or follow-up with pulmonologist for this condition -Will benefit of PFTs and pulmonology evaluation as an outpatient.  CAP (community acquired pneumonia) -Continue IV antibiotics -Follow culture results -As mentioned above supportive care, bronchodilator management, mucolytic's, flutter valve/incentive spirometer and wean off oxygen supplementation as tolerated.  Hypokalemia -Most likely associated with the use of diuretic -Will replete electrolytes and follow trend. -Checking magnesium level as well -Continue telemetry monitoring.  Atrial fibrillation (HCC) -Stable and rate controlled appreciated -Continue Eliquis for secondary prevention -Continue beta-blocker for rate management; in the setting of acute pulmonary process and hypoxia we will hold on amiodarone initially. -Follow clinical response. -Continue telemetry monitoring -Follow electrolytes and replete and as needed with goal for potassium above 4 and magnesium above 2.  Diabetes mellitus without complication (HCC) -Update A1c -Hold oral hypoglycemic agents while inpatient -Sliding scale insulin has been started -Follow CBGs fluctuation and adjust management as needed.  Hypertension -Stable for the most part -Continue home antihypertensive agents at this time.     Advance Care Planning:   Code Status: Full Code   Consults: None  Family Communication: No family at bedside.  Severity of Illness: The appropriate patient status for this patient is INPATIENT. Inpatient status is judged to be reasonable and necessary in order to provide the required intensity of service to ensure the patient's safety. The patient's presenting  symptoms, physical exam findings, and initial radiographic and laboratory data in the context of their chronic comorbidities is felt to place them at high risk for further clinical deterioration. Furthermore, it is not anticipated that the patient will be medically stable for discharge from the hospital within 2 midnights of admission.   * I certify that at the point of admission it is my clinical judgment that the patient will require inpatient hospital care spanning beyond 2 midnights from the point of admission due to high intensity of service, high risk for further deterioration and high frequency of surveillance required.*  Author: Vassie Loll, MD 05/26/2022 6:09 PM  For on call review [email or web address].

## 2022-06-23 NOTE — Assessment & Plan Note (Signed)
-  TSH 5.391 (improved from previous available records in the 6-7 range). -Continue Synthroid -Patient advised to take medication for thing in the morning on empty stomach. -Repeat thyroid panel in the next 6-8 weeks.

## 2022-06-23 NOTE — Assessment & Plan Note (Signed)
-  Stable and rate controlled at the moment. -Patient condition appears to be paroxysmal in nature. -Continue Eliquis for secondary prevention -Continue beta-blocker low-dose amiodarone for rate control -Follow clinical response and telemetry monitoring.. -Follow electrolytes and replete and as needed with goal for potassium above 4 and magnesium above 2.

## 2022-06-23 NOTE — Assessment & Plan Note (Signed)
-  Chest images suggesting the presence of interstitial lung disease and pulmonary fibrosis -Will provide treatment with bronchodilator management, steroids and supportive care -Per chart review no evaluation or follow-up with pulmonologist for this condition -Will benefit of PFTs and pulmonology evaluation as an outpatient.

## 2022-06-23 NOTE — ED Notes (Signed)
SWOT nurse and RT called to transport pt to ICU.

## 2022-06-23 NOTE — Assessment & Plan Note (Signed)
-  Continue IV antibiotics -Follow culture results -As mentioned above supportive care, bronchodilator management, mucolytic's, flutter valve/incentive spirometer and wean off oxygen supplementation as tolerated.

## 2022-06-23 NOTE — ED Provider Notes (Signed)
[place] EMERGENCY DEPARTMENT AT [facility] Provider Note   CSN: [id number] Arrival date & time: 06/17/2022  1002     History  Chief Complaint  Patient presents with   Shortness of Breath    Andrew Humphrey is a 82 y.o. male.   Shortness of Breath Associated symptoms: cough   Patient presents for shortness of breath.  Medical history includes prior GI bleed, anemia, HTN, atrial fibrillation, T2DM.  Onset of shortness of breath and weakness was 3 days ago.  Patient is on Eliquis.  He has been prescribed as needed Lasix in the past.  He has taken it over the past 2 days.  He does not wear oxygen at baseline.  Patient currently denies any areas of pain.  He has had a nonproductive cough.     Home Medications Prior to Admission medications   Medication Sig Start Date End Date Taking? Authorizing Provider  acetaminophen (TYLENOL) 325 MG tablet Take 2 tablets (650 mg total) by mouth every 6 (six) hours as needed for mild pain (or Fever >/= 101). 01/08/20  Yes Zannie Cove, MD  amiodarone (PACERONE) 200 MG tablet Take 0.5 tablets (100 mg total) by mouth daily. 06/26/21  Yes Strader, [place] M, PA-C  amLODipine (NORVASC) 10 MG tablet Take 10 mg by mouth daily. 06/04/21  Yes [provider]  apixaban (ELIQUIS) 5 MG TABS tablet Take 1 tablet (5 mg total) by mouth 2 (two) times daily. 05/27/22  Yes Jonelle Sidle, MD  Cholecalciferol (VITAMIN D-3) 25 MCG (1000 UT) CAPS Take 1,000 Units by mouth every other day.   Yes [provider]  Cyanocobalamin (VITAMIN B 12 PO) Take 1,000 mcg by mouth daily.   Yes [provider]  ferrous sulfate 325 (65 FE) MG tablet Take 325 mg by mouth 2 (two) times daily with a meal.   Yes [provider]  furosemide (LASIX) 20 MG tablet Take 20 mg by mouth daily as needed. 08/05/21  Yes [provider]  glipiZIDE (GLUCOTROL XL) 10 MG 24 hr tablet Take 10 mg by mouth at bedtime. 11/21/19  Yes [provider]  JANUVIA 25 MG tablet Take 25 mg by mouth daily. 06/05/22  Yes [provider]  levothyroxine (SYNTHROID) 25 MCG tablet Take 25 mcg by mouth at bedtime. 08/06/21  Yes [provider]  metFORMIN (GLUCOPHAGE) 1000 MG tablet Take 1,000 mg by mouth at bedtime. 06/12/17  Yes [provider]  metoprolol succinate (TOPROL-XL) 100 MG 24 hr tablet TAKE 1 TABLET BY MOUTH ONCE DAILY WITH  OR  IMMEDIATELY  FOLLOWING  A  MEAL 01/20/22  Yes Jonelle Sidle, MD  Multiple Vitamins-Minerals (MULTIVITAMIN WITH MINERALS) tablet Take 1 tablet by mouth daily.   Yes [provider]  pantoprazole (PROTONIX) 40 MG tablet Take 1 tablet (40 mg total) by mouth daily. 07/26/21 06/10/2022 Yes Evlyn Kanner, MD      Allergies    Patient has no known allergies.    Review of Systems   Review of Systems  Constitutional:  Positive for fatigue.  Respiratory:  Positive for cough and shortness of breath.   Neurological:  Positive for weakness (Generalized).  All other systems reviewed and are negative.   Physical Exam Updated Vital Signs BP 119/69   Pulse 81   Temp 97.9 F (36.6 C) (Axillary)   Resp (!) 40   Ht 5\' 7"  (1.702 m)   Wt 88 kg   SpO2 93%   BMI 30.38  kg/m  Physical Exam Vitals and nursing note reviewed.  Constitutional:      General: He is not in acute distress.    Appearance: He is well-developed. He is ill-appearing. He is not toxic-appearing or diaphoretic.  HENT:     Head: Normocephalic and atraumatic.     Mouth/Throat:     Mouth: Mucous membranes are moist.  Eyes:     Conjunctiva/sclera: Conjunctivae normal.  Cardiovascular:     Rate and Rhythm: Normal rate and regular rhythm.     Heart sounds: No murmur heard. Pulmonary:     Effort: Tachypnea present. No respiratory distress.     Breath sounds: Normal breath sounds. No stridor or decreased air movement. No wheezing or rhonchi.  Chest:     Chest wall: No tenderness.  Abdominal:      Palpations: Abdomen is soft.     Tenderness: There is no abdominal tenderness.  Musculoskeletal:        General: No swelling. Normal range of motion.     Cervical back: Normal range of motion and neck supple.     Right lower leg: No edema.     Left lower leg: No edema.  Skin:    General: Skin is warm and dry.     Coloration: Skin is not cyanotic or pale.  Neurological:     General: No focal deficit present.     Mental Status: He is alert and oriented to person, place, and time.  Psychiatric:        Mood and Affect: Mood normal.        Behavior: Behavior normal.     ED Results / Procedures / Treatments   Labs (all labs ordered are listed, but only abnormal results are displayed) Labs Reviewed  BASIC METABOLIC PANEL - Abnormal; Notable for the following components:      Result Value   Sodium 132 (*)    Potassium 3.2 (*)    CO2 19 (*)    Glucose, Bld 149 (*)    Calcium 8.5 (*)    All other components within normal limits  CBC - Abnormal; Notable for the following components:   WBC 17.6 (*)    RBC 3.91 (*)    Hemoglobin 12.6 (*)    HCT 38.4 (*)    RDW 23.4 (*)    All other components within normal limits  PROTIME-INR - Abnormal; Notable for the following components:   Prothrombin Time 16.8 (*)    INR 1.4 (*)    All other components within normal limits  HEPATIC FUNCTION PANEL - Abnormal; Notable for the following components:   Albumin 3.1 (*)    Total Bilirubin 1.3 (*)    Bilirubin, Direct 0.3 (*)    Indirect Bilirubin 1.0 (*)    All other components within normal limits  D-DIMER, QUANTITATIVE - Abnormal; Notable for the following components:   D-Dimer, Quant 0.74 (*)    All other components within normal limits  BLOOD GAS, VENOUS - Abnormal; Notable for the following components:   pCO2, Ven 33 (*)    Acid-base deficit 3.1 (*)    All other components within normal limits  BRAIN NATRIURETIC PEPTIDE - Abnormal; Notable for the following components:   B Natriuretic  Peptide 212.0 (*)    All other components within normal limits  RESP PANEL BY RT-PCR (RSV, FLU A&B, COVID)  RVPGX2  EXPECTORATED SPUTUM ASSESSMENT W GRAM STAIN, RFLX TO RESP C  CULTURE, BLOOD (ROUTINE X 2)  CULTURE, BLOOD (ROUTINE  X 2)  MAGNESIUM  STREP PNEUMONIAE URINARY ANTIGEN  LEGIONELLA PNEUMOPHILA SEROGP 1 UR AG  PHOSPHORUS  TSH  HEMOGLOBIN A1C  TROPONIN I (HIGH SENSITIVITY)  TROPONIN I (HIGH SENSITIVITY)    EKG EKG Interpretation  Date/Time:  Monday June 23 2022 10:45:54 EDT Ventricular Rate:  76 PR Interval:  192 QRS Duration: 104 QT Interval:  422 QTC Calculation: 474 R Axis:   -58 Text Interpretation: Normal sinus rhythm Left anterior fascicular block Possible Anterior infarct , age undetermined Abnormal ECG Confirmed by Gloris Manchester [phone]) on 06/12/2022 11:59:56 AM  Radiology CT Angio Chest PE W and/or Wo Contrast  Result Date: 06/17/2022 CLINICAL DATA:  Chest pain and shortness of breath. EXAM: CT ANGIOGRAPHY CHEST WITH CONTRAST TECHNIQUE: Multidetector CT imaging of the chest was performed using the standard protocol during bolus administration of intravenous contrast. Multiplanar CT image reconstructions and MIPs were obtained to evaluate the vascular anatomy. RADIATION DOSE REDUCTION: This exam was performed according to the departmental dose-optimization program which includes automated exposure control, adjustment of the mA and/or kV according to patient size and/or use of iterative reconstruction technique. CONTRAST:  75mL OMNIPAQUE IOHEXOL 350 MG/ML SOLN COMPARISON:  Prior CTA of the chest on 12/06/2019 FINDINGS: Cardiovascular: The pulmonary arteries are well opacified to the subsegmental level. There is no evidence of acute or chronic pulmonary embolism. Central pulmonary arteries are dilated with the main pulmonary artery measuring up to approximately 3.8 cm. Atherosclerosis of the thoracic aorta present without aneurysmal disease. Coronary atherosclerosis present.  The heart size is top normal. No pericardial fluid identified. Calcified aortic valve and mitral valve annulus. Mediastinum/Nodes: Mildly prominent scattered mediastinal and bilateral hilar lymph nodes. Largest mediastinal lymph node is a roughly 14 mm subcarinal node. Other scattered right paratracheal and AP window nodes present. Bilateral mildly prominent hilar lymph node tissue with the largest right hilar lymph node measuring approximately 12 mm in short axis. Small hiatal hernia. Lungs/Pleura: Superimposed on previously visualized chronic interstitial lung disease is new widespread ground-glass airspace opacity throughout both lungs, slightly worse in the right lung. There also likely is some worsening of interstitial disease with potentially some fibrotic disease now in the periphery of both lungs. No associated pneumothorax, pleural fluid or significant bronchiectasis. No discrete pulmonary masses are identified. Upper Abdomen: Status post cholecystectomy. No acute findings in the visualized upper abdomen. Musculoskeletal: No chest wall abnormality. No acute or significant osseous findings. Review of the MIP images confirms the above findings. IMPRESSION: 1. No evidence of acute or chronic pulmonary embolism. 2. Dilated central pulmonary arteries consistent with pulmonary hypertension. 3. Superimposed on previously visualized mild chronic interstitial lung disease is new widespread ground-glass airspace opacity throughout both lungs, slightly worse in the right lung. There also likely is some worsening of interstitial disease with potentially some fibrotic disease now in the periphery of both lungs. Findings are suggestive of acute pneumonia superimposed on chronic interstitial lung disease and pulmonary fibrosis. 4. Mildly prominent mediastinal and bilateral hilar lymph nodes are felt to be likely reactive given the significant acute bilateral pulmonary airspace disease. 5. Coronary atherosclerosis. 6.  Aortic atherosclerosis. 7. Small hiatal hernia. Aortic Atherosclerosis (ICD10-I70.0). Electronically Signed   By: Irish Lack M.D.   On: 06/22/2022 13:11   DG Chest Port 1 View  Result Date: 06/08/2022 CLINICAL DATA:  Shortness of breath EXAM: PORTABLE CHEST 1 VIEW COMPARISON:  X-ray 07/23/2021 FINDINGS: Enlarged cardiopericardial silhouette. No pneumothorax or effusion. Increasing interstitial and parenchymal lung opacities bilaterally. An acute infiltrative process possible.  Recommend follow-up. Overlapping cardiac leads. IMPRESSION: Decreasing interstitial and parenchymal opacities compared to the prior. An acute process is possible. Underlying chronic changes are also in the differential with the prior x-ray. Recommend short follow-up Electronically Signed   By: Karen Kays M.D.   On: 06/24/2022 11:50    Procedures Procedures    Medications Ordered in ED Medications  potassium chloride 10 mEq in 100 mL IVPB (10 mEq Intravenous New Bag/Given 06/10/2022 1353)  methylPREDNISolone sodium succinate (SOLU-MEDROL) 125 mg/2 mL injection 125 mg (has no administration in time range)  cefTRIAXone (ROCEPHIN) 2 g in sodium chloride 0.9 % 100 mL IVPB (has no administration in time range)  azithromycin (ZITHROMAX) 500 mg in sodium chloride 0.9 % 250 mL IVPB (has no administration in time range)  methylPREDNISolone sodium succinate (SOLU-MEDROL) 40 mg/mL injection 40 mg (has no administration in time range)  sodium chloride flush (NS) 0.9 % injection 3 mL (3 mLs Intravenous Given 06/21/2022 1533)  sodium chloride flush (NS) 0.9 % injection 3 mL (has no administration in time range)  0.9 %  sodium chloride infusion (has no administration in time range)  acetaminophen (TYLENOL) tablet 650 mg (has no administration in time range)    Or  acetaminophen (TYLENOL) suppository 650 mg (has no administration in time range)  ondansetron (ZOFRAN) tablet 4 mg (has no administration in time range)    Or  ondansetron  (ZOFRAN) injection 4 mg (has no administration in time range)  amiodarone (PACERONE) tablet 100 mg (has no administration in time range)  apixaban (ELIQUIS) tablet 5 mg (has no administration in time range)  cholecalciferol (VITAMIN D3) 25 MCG (1000 UNIT) tablet 1,000 Units (has no administration in time range)  furosemide (LASIX) injection 20 mg (has no administration in time range)  levothyroxine (SYNTHROID) tablet 25 mcg (has no administration in time range)  metoprolol succinate (TOPROL-XL) 24 hr tablet 100 mg (has no administration in time range)  pantoprazole (PROTONIX) EC tablet 40 mg (has no administration in time range)  budesonide (PULMICORT) nebulizer solution 0.5 mg (has no administration in time range)  insulin aspart (novoLOG) injection 0-9 Units (has no administration in time range)  insulin aspart (novoLOG) injection 0-5 Units (has no administration in time range)  ipratropium-albuterol (DUONEB) 0.5-2.5 (3) MG/3ML nebulizer solution (  Not Given 06/04/2022 1506)  ipratropium-albuterol (DUONEB) 0.5-2.5 (3) MG/3ML nebulizer solution 3 mL (has no administration in time range)  sodium chloride flush (NS) 0.9 % injection 3 mL (3 mLs Intravenous Given 06/15/2022 1110)  iohexol (OMNIPAQUE) 350 MG/ML injection 100 mL (75 mLs Intravenous Contrast Given 05/31/2022 1230)  cefTRIAXone (ROCEPHIN) 1 g in sodium chloride 0.9 % 100 mL IVPB (0 g Intravenous Stopped 06/04/2022 1424)  azithromycin (ZITHROMAX) 500 mg in sodium chloride 0.9 % 250 mL IVPB (500 mg Intravenous New Bag/Given 06/01/2022 1406)  furosemide (LASIX) injection 20 mg (20 mg Intravenous Given 06/18/2022 1351)    ED Course/ Medical Decision Making/ A&P                             Medical Decision Making Amount and/or Complexity of Data Reviewed Labs: ordered. Radiology: ordered.  Risk Prescription drug management. Decision regarding hospitalization.   This patient presents to the ED for concern of shortness of breath, this involves an  extensive number of treatment options, and is a complaint that carries with it a high risk of complications and morbidity.  The differential diagnosis includes pneumonia, CHF, reactive airway  disease, other chronic lung disease, viral illness, anemia, acidosis   Co morbidities that complicate the patient evaluation  GI bleed, anemia, HTN, atrial fibrillation, T2DM   Additional history obtained:  Additional history obtained from patient's daughter External records from outside source obtained and reviewed including EMR   Lab Tests:  I Ordered, and personally interpreted labs.  The pertinent results include: Leukocytosis is present.  Hemoglobin is normal.  There are some mild hypokalemia with otherwise normal electrolytes.  BNP is moderately elevated.  Troponin is normal.  D-dimer, when age-adjusted, is normal.   Imaging Studies ordered:  I ordered imaging studies including x-ray, CTA chest I independently visualized and interpreted imaging which showed findings consistent with acute pneumonia superimposed on chronic interstitial lung disease and pulmonary fibrosis.  Dilated central pulmonary arteries consistent with pulmonary hypertension. I agree with the radiologist interpretation   Cardiac Monitoring: / EKG:  The patient was maintained on a cardiac monitor.  I personally viewed and interpreted the cardiac monitored which showed an underlying rhythm of: Sinus rhythm  Problem List / ED Course / Critical interventions / Medication management  Patient presenting for worsening shortness of breath over the past 3 days.  He is not on oxygen at baseline.  On arrival in the ED, he was found to be hypoxic on room air with SpO2 of 67%.  He was placed on nonrebreather with 15 L of supplemental oxygen.  He remained hypoxic in the 80s.  He was then placed on BiPAP and SpO2 normalized on 50% FiO2.  Although patient does not have a diagnosis of CHF or COPD, he has been prescribed as needed Lasix  and has been taking this over the past 2 days.  He is on Lasix for history of atrial fibrillation.  Currently, he is in sinus rhythm.  Despite his hypoxia, his lungs are clear to auscultation.  He was kept on BiPAP, which she is tolerating well.  He does have a cough present.  He states the cough has been nonproductive.  Diagnostic workup was initiated.  Lab work is notable for leukocytosis and mild hypokalemia.  Replacement potassium was ordered.  BNP was moderately elevated.  Lasix was ordered.  Patient underwent CTA of chest which did not show PE.  It did show findings consistent with pneumonia superimposed on chronic lung fibrosis.  Antibiotics were ordered.  Solu-Medrol was ordered.  Patient was admitted for further management. I ordered medication including potassium chloride for hypokalemia; ceftriaxone and azithromycin for pneumonia; Solu-Medrol for chronic interstitial lung disease; Lasix for diuresis Reevaluation of the patient after these medicines showed that the patient improved I have reviewed the patients home medicines and have made adjustments as needed   Social Determinants of Health:  Lives independently  CRITICAL CARE Performed by: Gloris Manchester   Total critical care time: 35 minutes  Critical care time was exclusive of separately billable procedures and treating other patients.  Critical care was necessary to treat or prevent imminent or life-threatening deterioration.  Critical care was time spent personally by me on the following activities: development of treatment plan with patient and/or surrogate as well as nursing, discussions with consultants, evaluation of patient's response to treatment, examination of patient, obtaining history from patient or surrogate, ordering and performing treatments and interventions, ordering and review of laboratory studies, ordering and review of radiographic studies, pulse oximetry and re-evaluation of patient's  condition.          Final Clinical Impression(s) / ED Diagnoses Final diagnoses:  Acute  respiratory failure with hypoxia (HCC)  Community acquired pneumonia, unspecified laterality    Rx / DC Orders ED Discharge Orders     None         Gloris Manchester, MD 06/08/2022 1547

## 2022-06-23 NOTE — ED Notes (Signed)
EDP at bedside  

## 2022-06-23 NOTE — Progress Notes (Signed)
Transported patient to and from CT Scan on BIPAP.  Tolerated well. °

## 2022-06-23 NOTE — Assessment & Plan Note (Addendum)
-  Most likely associated with the use of diuretics -Check magnesium level -Continue electrolyte repletion and follow trend.

## 2022-06-23 NOTE — Progress Notes (Signed)
Transported patient to ICU tolerated well.

## 2022-06-23 NOTE — Progress Notes (Signed)
Tried patient off BIPAP to 15 L salter Zearing patient only able to keep sats at 88% so restarted BIPAP.

## 2022-06-23 NOTE — ED Triage Notes (Signed)
Pt presents to ED with increased SOB since Friday and increased weakness per girlfriend.

## 2022-06-23 NOTE — Assessment & Plan Note (Signed)
Continue PPI ?

## 2022-06-23 NOTE — Assessment & Plan Note (Signed)
-  Acute hypoxic respiratory failure with oxygen saturation in the 60 range on room air. -Patient requiring BiPAP support to stabilize saturation. -Acute community-acquired pneumonia on top of acute on chronic ILD/pulmonary fibrosis exacerbation most likely responsible for his hypoxemic process. -BNP minimally elevated and no crackles or significant fluid overload findings on exam. -Continue steroids, antibiotics, mucolytic's, bronchodilator/nebulizer management and supportive care. -Wean off BiPAP and oxygen supplementation as tolerated. -Follow clinical response.

## 2022-06-23 NOTE — Assessment & Plan Note (Signed)
-  Update A1c -Hold oral hypoglycemic agents while inpatient -Sliding scale insulin has been started -Follow CBGs fluctuation and adjust management as needed.

## 2022-06-23 NOTE — Assessment & Plan Note (Signed)
-  Stable for the most part -Continue home antihypertensive agents at this time. -Heart healthy/low-sodium diet discussed with patient.

## 2022-06-24 DIAGNOSIS — E039 Hypothyroidism, unspecified: Secondary | ICD-10-CM | POA: Diagnosis not present

## 2022-06-24 DIAGNOSIS — I10 Essential (primary) hypertension: Secondary | ICD-10-CM | POA: Diagnosis not present

## 2022-06-24 DIAGNOSIS — J9601 Acute respiratory failure with hypoxia: Secondary | ICD-10-CM | POA: Diagnosis not present

## 2022-06-24 DIAGNOSIS — E876 Hypokalemia: Secondary | ICD-10-CM | POA: Diagnosis not present

## 2022-06-24 LAB — BASIC METABOLIC PANEL
Anion gap: 12 (ref 5–15)
BUN: 21 mg/dL (ref 8–23)
CO2: 20 mmol/L — ABNORMAL LOW (ref 22–32)
Calcium: 8.1 mg/dL — ABNORMAL LOW (ref 8.9–10.3)
Chloride: 102 mmol/L (ref 98–111)
Creatinine, Ser: 1.04 mg/dL (ref 0.61–1.24)
GFR, Estimated: >60 mL/min (ref >60)
Glucose, Bld: 287 mg/dL — ABNORMAL HIGH (ref 70–99)
Potassium: 3.3 mmol/L — ABNORMAL LOW (ref 3.5–5.1)
Sodium: 134 mmol/L — ABNORMAL LOW (ref 135–145)

## 2022-06-24 LAB — CBC
HCT: 32.6 % — ABNORMAL LOW (ref 39.0–52.0)
Hemoglobin: 10.7 g/dL — ABNORMAL LOW (ref 13.0–17.0)
MCH: 32.2 pg (ref 26.0–34.0)
MCHC: 32.8 g/dL (ref 30.0–36.0)
MCV: 98.2 fL (ref 80.0–100.0)
Platelets: 199 10*3/uL (ref 150–400)
RBC: 3.32 MIL/uL — ABNORMAL LOW (ref 4.22–5.81)
RDW: 22.8 % — ABNORMAL HIGH (ref 11.5–15.5)
WBC: 15.5 10*3/uL — ABNORMAL HIGH (ref 4.0–10.5)
nRBC: 0 % (ref 0.0–0.2)

## 2022-06-24 LAB — GLUCOSE, CAPILLARY
Glucose-Capillary: 257 mg/dL — ABNORMAL HIGH (ref 70–99)
Glucose-Capillary: 293 mg/dL — ABNORMAL HIGH (ref 70–99)
Glucose-Capillary: 193 mg/dL — ABNORMAL HIGH (ref 70–99)
Glucose-Capillary: 223 mg/dL — ABNORMAL HIGH (ref 70–99)

## 2022-06-24 LAB — STREP PNEUMONIAE URINARY ANTIGEN: Strep Pneumo Urinary Antigen: NEGATIVE

## 2022-06-24 LAB — CULTURE, BLOOD (ROUTINE X 2)

## 2022-06-24 MED ORDER — POTASSIUM CHLORIDE CRYS ER 20 MEQ PO TBCR
20.0000 meq | EXTENDED_RELEASE_TABLET | Freq: Once | ORAL | Status: AC
  Administered 2022-06-24: 20 meq via ORAL
  Filled 2022-06-24: qty 1

## 2022-06-24 MED ORDER — LORAZEPAM 2 MG/ML IJ SOLN
1.0000 mg | Freq: Once | INTRAMUSCULAR | Status: AC
  Administered 2022-06-24: 1 mg via INTRAVENOUS

## 2022-06-24 MED ORDER — HALOPERIDOL LACTATE 5 MG/ML IJ SOLN
2.0000 mg | Freq: Once | INTRAMUSCULAR | Status: AC
  Administered 2022-06-24: 2 mg via INTRAVENOUS
  Filled 2022-06-24: qty 1

## 2022-06-24 MED ORDER — INSULIN GLARGINE-YFGN 100 UNIT/ML ~~LOC~~ SOLN
7.0000 [IU] | Freq: Every day | SUBCUTANEOUS | Status: DC
  Administered 2022-06-24 – 2022-06-25 (×2): 7 [IU] via SUBCUTANEOUS
  Filled 2022-06-24 (×2): qty 0.07

## 2022-06-24 MED ORDER — LORAZEPAM 2 MG/ML IJ SOLN
INTRAMUSCULAR | Status: AC
  Filled 2022-06-24: qty 1

## 2022-06-24 MED ORDER — SACCHAROMYCES BOULARDII 250 MG PO CAPS
250.0000 mg | ORAL_CAPSULE | Freq: Two times a day (BID) | ORAL | Status: DC
  Administered 2022-06-24 – 2022-06-25 (×3): 250 mg via ORAL
  Filled 2022-06-24 (×3): qty 1

## 2022-06-24 NOTE — Progress Notes (Signed)
Patient was in Heated High flow Oxygen at 30litres in 100%, his oxygen saturation was sustaining on mid 80s, this RN asked patient to do the flutter valve and incentive spirometer, but it didn't help with the improve in 02 sats, informed RT, patient put on BIPAP, as soon as patient is on BIPAP, he started being restless, tachypneic, SOB, MD made aware, got an order for Ativan 1 mg, still tachypneic, and in distress, RT at bedside, will continue to monitor,

## 2022-06-24 NOTE — Progress Notes (Signed)
Progress Note   Patient: Andrew Humphrey:[id number] DOB: 04/11/1940 DOA: 05/29/2022     1 DOS: the patient was seen and examined on 06/24/2022   Brief hospital admission course: DANZIG MACGREGOR is a 82 y.o. male with medical history significant of hypertension, history of atrial fibrillation (on chronic Eliquis), type 2 diabetes without complications, history of GI bleed/GERD and hypothyroidism; who presented to the hospital secondary to shortness of breath and coughing spells.  Patient's symptoms have been present for the last 3 days and worsening; at time of presentation to the ED symptoms were so severe that he was not able to ambulate long distances.  Patient reported associated slightly productive coughing spells and feeling wheezy.     Patient did not wear oxygen supplementation at home and on arrival saturation was in the mid 60s range.  Patient denies chest pain, nausea, vomiting, focal weaknesses, dysuria, hematuria, melena/hematochezia or abdominal pain.   Workup in the ED demonstrating positive bilateral pneumonia and changes suggesting underlying interstitial lung disease/pulmonary fibrosis.    Assessment and Plan: * Acute hypoxic respiratory failure (HCC) -Acute hypoxic respiratory failure with oxygen saturation in the 60 range on room air at time of admission. -Patient requiring BiPAP support to stabilize saturation and respiratory distress. -Currently off BiPAP but is still requiring high flow nasal cannula supplementation (13-15 L). -Acute community-acquired pneumonia on top of acute on chronic ILD/pulmonary fibrosis exacerbation most likely responsible for his hypoxemic process. -Continue IV antibiotics, steroids, mucolytic's, bronchodilator/nebulizer management and the use of flutter valve/incentive spirometer -Wean off oxygen supplementation as tolerated -Continue close monitoring of his clinical response.  GERD (gastroesophageal reflux disease) -Continue  PPI.  Hypothyroidism -TSH 5.391 (improved from previous available records in the 6-7 range). -Continue Synthroid -Patient advised to take medication for thing in the morning on empty stomach. -Repeat thyroid panel in the next 6-8 weeks.  Pulmonary fibrosis (HCC) -Chest images suggesting the presence of interstitial lung disease and pulmonary fibrosis -Continue treatment with steroids -Follow clinical response. -Per chart review no previous evaluation or follow-up with pulmonologist for this condition. -Will benefit of PFTs and pulmonology evaluation as an outpatient.  CAP (community acquired pneumonia) -Continue IV antibiotics and supportive care as mentioned above. -Continue to follow cultures results.  Hypokalemia -Most likely associated with the use of diuretics -Check magnesium level -Continue electrolyte repletion and follow trend.  Atrial fibrillation (HCC) -Stable and rate controlled at the moment. -Patient condition appears to be paroxysmal in nature. -Continue Eliquis for secondary prevention -Continue beta-blocker low-dose amiodarone for rate control -Follow clinical response and telemetry monitoring.. -Follow electrolytes and replete and as needed with goal for potassium above 4 and magnesium above 2.  Diabetes mellitus without complication (HCC) -A1c 6.5 -Continue to hold oral hypoglycemic agents while inpatient -Continue sliding scale and Semglee -Follow CBGs fluctuation and adjust management as needed.  Hypertension -Stable for the most part -Continue home antihypertensive agents at this time. -Heart healthy/low-sodium diet discussed with patient.   Subjective:  Afebrile, reporting feeling slightly better; still tachypneic and short winded with activity; off BiPAP and requiring high flow nasal cannula supplementation (13-15 L).  Physical Exam: Vitals:   06/24/22 0700 06/24/22 0722 06/24/22 0755 06/24/22 0800  BP: (!) 125/59   117/64  Pulse: 68 77  72   Resp: (!) 27   20  Temp:  (!) 96.5 F (35.8 C)    TempSrc:  Axillary    SpO2: 97% 98% 91% 96%  Weight:      Height:  General exam: Alert, awake, oriented x 3; off BiPAP; reporting no chest pain, no nausea, no vomiting.  Still short winded with activity and requiring high flow nasal cannula supplementation (around 13-15 L) Respiratory system: Positive rhonchi bilaterally; mild expiratory wheezing, tachypnea with exertion appreciated. Cardiovascular system: Rate controlled, no rubs, no gallops, no JVD. Gastrointestinal system: Abdomen is nondistended, soft and nontender. No organomegaly or masses felt. Normal bowel sounds heard. Central nervous system: Alert and oriented. No focal neurological deficits. Extremities: No cyanosis or clubbing; no lower extremity edema appreciated. Skin: No petechiae. Psychiatry: Judgement and insight appear normal. Mood & affect appropriate.   Data Reviewed: Basic metabolic panel: Sodium 134, potassium 3.3, chloride 102, bicarb 20, BUN 21, creatinine 1.04 and anion gap 12 CBC: White blood cells 15.5, hemoglobin 10.7 and platelet count 199 K   Family Communication: Wife at bedside.  Disposition: Status is: Inpatient Remains inpatient appropriate because: Continue IV antibiotics and treatment for acute hypoxemic respiratory failure.   Planned Discharge Destination: Home   Time spent: 50 minutes  Author: Vassie Loll, MD 06/24/2022 10:12 AM  For on call review [email or web address].

## 2022-06-24 NOTE — Progress Notes (Signed)
Patient taken off of BIPAP and tried on 15L salter nasal cannula. Patient sats are 91% and he is tolerating well at this time. BIPAP remains at bedside if needed. RN aware.

## 2022-06-24 NOTE — Plan of Care (Signed)
  Problem: Activity: Goal: Ability to tolerate increased activity will improve Outcome: Not Progressing   Problem: Clinical Measurements: Goal: Ability to maintain a body temperature in the normal range will improve Outcome: Not Progressing   Problem: Respiratory: Goal: Ability to maintain adequate ventilation will improve Outcome: Not Progressing Goal: Ability to maintain a clear airway will improve Outcome: Not Progressing   Problem: Education: Goal: Ability to describe self-care measures that may prevent or decrease complications (Diabetes Survival Skills Education) will improve Outcome: Not Progressing Goal: Individualized Educational Video(s) Outcome: Not Progressing   Problem: Coping: Goal: Ability to adjust to condition or change in health will improve Outcome: Not Progressing   Problem: Fluid Volume: Goal: Ability to maintain a balanced intake and output will improve Outcome: Not Progressing   Problem: Health Behavior/Discharge Planning: Goal: Ability to identify and utilize available resources and services will improve Outcome: Not Progressing Goal: Ability to manage health-related needs will improve Outcome: Not Progressing   Problem: Metabolic: Goal: Ability to maintain appropriate glucose levels will improve Outcome: Not Progressing   Problem: Nutritional: Goal: Maintenance of adequate nutrition will improve Outcome: Not Progressing Goal: Progress toward achieving an optimal weight will improve Outcome: Not Progressing   Problem: Skin Integrity: Goal: Risk for impaired skin integrity will decrease Outcome: Not Progressing   Problem: Tissue Perfusion: Goal: Adequacy of tissue perfusion will improve Outcome: Not Progressing   Problem: Education: Goal: Knowledge of General Education information will improve Description: Including pain rating scale, medication(s)/side effects and non-pharmacologic comfort measures Outcome: Not Progressing   Problem:  Health Behavior/Discharge Planning: Goal: Ability to manage health-related needs will improve Outcome: Not Progressing   Problem: Clinical Measurements: Goal: Ability to maintain clinical measurements within normal limits will improve Outcome: Not Progressing Goal: Will remain free from infection Outcome: Not Progressing Goal: Diagnostic test results will improve Outcome: Not Progressing Goal: Respiratory complications will improve Outcome: Not Progressing Goal: Cardiovascular complication will be avoided Outcome: Not Progressing   Problem: Activity: Goal: Risk for activity intolerance will decrease Outcome: Not Progressing   Problem: Nutrition: Goal: Adequate nutrition will be maintained Outcome: Not Progressing   Problem: Coping: Goal: Level of anxiety will decrease Outcome: Not Progressing   Problem: Elimination: Goal: Will not experience complications related to bowel motility Outcome: Not Progressing Goal: Will not experience complications related to urinary retention Outcome: Not Progressing   Problem: Pain Managment: Goal: General experience of comfort will improve Outcome: Not Progressing   Problem: Safety: Goal: Ability to remain free from injury will improve Outcome: Not Progressing   Problem: Skin Integrity: Goal: Risk for impaired skin integrity will decrease Outcome: Not Progressing

## 2022-06-24 NOTE — Inpatient Diabetes Management (Signed)
Inpatient Diabetes Program Recommendations  AACE/ADA: New Consensus Statement on Inpatient Glycemic Control   Target Ranges:  Prepandial:   less than 140 mg/dL      Peak postprandial:   less than 180 mg/dL (1-2 hours)      Critically ill patients:  140 - 180 mg/dL    Latest Reference Range & Units 06/07/2022 16:59 05/27/2022 19:56 06/24/22 07:24  Glucose-Capillary 70 - 99 mg/dL 295 (H) 284 (H) 132 (H)    Review of Glycemic Control  Diabetes history: DM2 Outpatient Diabetes medications: Glipizide XL 10 mg QHS, Januvia 25 mg daily, Metformin 1000 mg QHS Current orders for Inpatient glycemic control: Novolog 0-9 units TID with meals, Novolog 0-5 units QHS; Solumedrol 40 mg Q12H  Inpatient Diabetes Program Recommendations:    Insulin: If steroids are continued as ordered, please consider ordering Semglee 5 units Q24H.   Thanks, Orlando Penner, RN, MSN, CDCES Diabetes Coordinator Inpatient Diabetes Program [phone] (Team Pager from 8am to 5pm)

## 2022-06-24 NOTE — TOC Initial Note (Signed)
Transition of Care [facility]) - Initial/Assessment Note    Patient Details  Name: Andrew Humphrey MRN: [id number] Date of Birth: 1940-12-10  [facility] CM/SW Contact:    Annice Needy, LCSW Phone Number: 06/24/2022, 3:45 PM  Clinical Narrative:                 Patient from home alone. Independent at baseline. Drives. Has cane and walker in the home. Admitted for Acute hypoxic respiratory failure (HCC), considered high risk for readmission. TOC following and will address needs.   Expected Discharge Plan: Home/Self Care Barriers to Discharge: Continued Medical Work up   Patient Goals and CMS Choice            Expected Discharge Plan and Services                                              Prior Living Arrangements/Services     Patient language and need for interpreter reviewed:: Yes Do you feel safe going back to the place where you live?: Yes      Need for Family Participation in Patient Care: Yes (Comment) Care giver support system in place?: Yes (comment) Current home services: DME (cane, walker) Criminal Activity/Legal Involvement Pertinent to Current Situation/Hospitalization: No - Comment as needed  Activities of Daily Living Home Assistive Devices/Equipment: Cane (specify quad or straight), Walker (specify type), Dentures (specify type), Eyeglasses, CBG Meter ADL Screening (condition at time of admission) Patient's cognitive ability adequate to safely complete daily activities?: Yes Is the patient deaf or have difficulty hearing?: No Does the patient have difficulty seeing, even when wearing glasses/contacts?: No Does the patient have difficulty concentrating, remembering, or making decisions?: No Patient able to express need for assistance with ADLs?: Yes Does the patient have difficulty dressing or bathing?: Yes Independently performs ADLs?: No Communication: Independent Dressing (OT): Needs assistance Is this a change from  baseline?: Change from baseline, expected to last <3days Grooming: Independent Feeding: Independent Bathing: Needs assistance Is this a change from baseline?: Change from baseline, expected to last <3 days Toileting: Needs assistance Is this a change from baseline?: Change from baseline, expected to last <3 days In/Out Bed: Needs assistance Is this a change from baseline?: Change from baseline, expected to last <3 days Walks in Home: Independent with device (comment) Does the patient have difficulty walking or climbing stairs?: No Weakness of Legs: None Weakness of Arms/Hands: None  Permission Sought/Granted Permission sought to share information with : Family Supports    Share Information with NAME: daughter, Gearldine Bienenstock           Emotional Assessment     Affect (typically observed): Appropriate Orientation: : Oriented to Self, Oriented to Place, Oriented to  Time, Oriented to Situation Alcohol / Substance Use: Not Applicable Psych Involvement: No (comment)  Admission diagnosis:  Acute respiratory failure with hypoxia (HCC) [J96.01] Community acquired pneumonia, unspecified laterality [J18.9] Acute hypoxic respiratory failure (HCC) [J96.01] Patient Active Problem List   Diagnosis Date Noted   Acute hypoxic respiratory failure (HCC) 06/18/2022   Hypokalemia 06/14/2022   CAP (community acquired pneumonia) 06/09/2022   Pulmonary fibrosis (HCC) 06/24/2022   Hypothyroidism 05/30/2022   GERD (gastroesophageal reflux disease) 06/17/2022   Heme positive stool    Benign neoplasm of ascending colon    Benign neoplasm of transverse colon    Benign neoplasm of descending  colon    Gastritis without bleeding    Gastrointestinal hemorrhage    Symptomatic anemia 07/23/2021   Acute biliary pancreatitis 01/02/2020   Sepsis due to Klebsiella (HCC) 12/09/2019   Acute cholecystitis    SIRS (systemic inflammatory response syndrome) (HCC) 12/05/2019   Atrial fibrillation (HCC) 06/19/2017    Hypertension    Diabetes mellitus without complication (HCC)    History of GI bleed 11/08/2016   Acute blood loss anemia 11/08/2016   Rectal bleeding    PCP:  Lianne Moris, PA-C Pharmacy:   [facility] [address] [place] - [address] [place] [address] Phone: [phone] Fax: [phone]     Social Determinants of Health (SDOH) Social History: SDOH Screenings   Food Insecurity: No Food Insecurity (06/09/2022)  Housing: Low Risk  (06/04/2022)  Transportation Needs: No Transportation Needs (06/22/2022)  Utilities: Not At Risk (06/20/2022)  Tobacco Use: High Risk (06/13/2022)   SDOH Interventions:     Readmission Risk Interventions     No data to display

## 2022-06-25 ENCOUNTER — Inpatient Hospital Stay (HOSPITAL_COMMUNITY): Payer: Medicare PPO

## 2022-06-25 DIAGNOSIS — J9601 Acute respiratory failure with hypoxia: Secondary | ICD-10-CM | POA: Diagnosis not present

## 2022-06-25 LAB — GLUCOSE, CAPILLARY
Glucose-Capillary: 244 mg/dL — ABNORMAL HIGH (ref 70–99)
Glucose-Capillary: 268 mg/dL — ABNORMAL HIGH (ref 70–99)
Glucose-Capillary: 264 mg/dL — ABNORMAL HIGH (ref 70–99)
Glucose-Capillary: 167 mg/dL — ABNORMAL HIGH (ref 70–99)

## 2022-06-25 LAB — BLOOD GAS, ARTERIAL
Acid-base deficit: 4.2 mmol/L — ABNORMAL HIGH (ref 0.0–2.0)
Bicarbonate: 19 mmol/L — ABNORMAL LOW (ref 20.0–28.0)
Drawn by: 41977
FIO2: 100 %
O2 Saturation: 96 %
Patient temperature: 37
pCO2 arterial: 28 mmHg — ABNORMAL LOW (ref 32–48)
pH, Arterial: 7.44 (ref 7.35–7.45)
pO2, Arterial: 78 mmHg — ABNORMAL LOW (ref 83–108)

## 2022-06-25 LAB — MAGNESIUM: Magnesium: 2.1 mg/dL (ref 1.7–2.4)

## 2022-06-25 LAB — BASIC METABOLIC PANEL
Anion gap: 8 (ref 5–15)
BUN: 30 mg/dL — ABNORMAL HIGH (ref 8–23)
CO2: 22 mmol/L (ref 22–32)
Calcium: 8.2 mg/dL — ABNORMAL LOW (ref 8.9–10.3)
Chloride: 105 mmol/L (ref 98–111)
Creatinine, Ser: 1.01 mg/dL (ref 0.61–1.24)
GFR, Estimated: >60 mL/min (ref >60)
Glucose, Bld: 249 mg/dL — ABNORMAL HIGH (ref 70–99)
Potassium: 3.6 mmol/L (ref 3.5–5.1)
Sodium: 135 mmol/L (ref 135–145)

## 2022-06-25 LAB — LEGIONELLA PNEUMOPHILA SEROGP 1 UR AG: L. pneumophila Serogp 1 Ur Ag: NEGATIVE

## 2022-06-25 LAB — T4, FREE: Free T4: 1.22 ng/dL — ABNORMAL HIGH (ref 0.61–1.12)

## 2022-06-25 MED ORDER — MORPHINE SULFATE (PF) 2 MG/ML IV SOLN
1.0000 mg | INTRAVENOUS | Status: DC | PRN
  Administered 2022-06-25: 1 mg via INTRAVENOUS
  Filled 2022-06-25: qty 1

## 2022-06-25 MED ORDER — MORPHINE SULFATE (PF) 2 MG/ML IV SOLN
1.0000 mg | INTRAVENOUS | Status: DC | PRN
  Administered 2022-06-25 – 2022-06-26 (×5): 1 mg via INTRAVENOUS
  Filled 2022-06-25 (×5): qty 1

## 2022-06-25 MED ORDER — POTASSIUM CHLORIDE CRYS ER 20 MEQ PO TBCR: 40.0000 meq | EXTENDED_RELEASE_TABLET | Freq: Once | ORAL | Status: DC

## 2022-06-25 MED ORDER — LORAZEPAM 2 MG/ML IJ SOLN
1.0000 mg | INTRAMUSCULAR | Status: DC | PRN
  Administered 2022-06-25 – 2022-06-26 (×3): 1 mg via INTRAVENOUS
  Filled 2022-06-25 (×3): qty 1

## 2022-06-25 MED ORDER — POTASSIUM CHLORIDE 10 MEQ/100ML IV SOLN
10.0000 meq | INTRAVENOUS | Status: AC
  Administered 2022-06-25 – 2022-06-26 (×2): 10 meq via INTRAVENOUS
  Filled 2022-06-25 (×3): qty 100

## 2022-06-25 NOTE — Progress Notes (Signed)
PROGRESS NOTE    KHALIFA KNECHT  ZOX:[id number] DOB: 19-Aug-1940 DOA: 06/05/2022 PCP: Lianne Moris, PA-C   Brief Narrative:    Andrew Humphrey is a 82 y.o. male with medical history significant of hypertension, history of atrial fibrillation (on chronic Eliquis), type 2 diabetes without complications, history of GI bleed/GERD and hypothyroidism; who presented to the hospital secondary to shortness of breath and coughing spells.  Patient was admitted with acute hypoxemic respiratory failure with oxygen saturation in the 60th percentile.  He is noted to have community-acquired pneumonia superimposed on interstitial lung disease and struggling with significant amounts of hypoxemia.  Assessment & Plan:   Principal Problem:   Acute hypoxic respiratory failure (HCC) Active Problems:   Hypertension   Diabetes mellitus without complication (HCC)   Atrial fibrillation (HCC)   Hypokalemia   CAP (community acquired pneumonia)   Pulmonary fibrosis (HCC)   Hypothyroidism   GERD (gastroesophageal reflux disease)  Assessment and Plan:  Acute hypoxic respiratory failure (HCC) -Acute hypoxic respiratory failure with oxygen saturation in the 60 range on room air at time of admission. -Patient requiring BiPAP support to stabilize saturation and respiratory distress. -Currently off BiPAP but is still requiring high flow nasal cannula supplementation; will likely require palliative evaluation soon -Acute community-acquired pneumonia on top of acute on chronic ILD/pulmonary fibrosis exacerbation most likely responsible for his hypoxemic process. -Continue IV antibiotics, steroids, mucolytic's, bronchodilator/nebulizer management and the use of flutter valve/incentive spirometer -Wean off oxygen supplementation as tolerated -Continue close monitoring of his clinical response.   GERD (gastroesophageal reflux disease) -Continue PPI.   Hypothyroidism -TSH 5.391 (improved from previous available  records in the 6-7 range). -Continue Synthroid, free T4 1.22 -Patient advised to take medication for thing in the morning on empty stomach. -Repeat thyroid panel in the next 6-8 weeks.   Pulmonary fibrosis (HCC) -Chest images suggesting the presence of interstitial lung disease and pulmonary fibrosis -Continue treatment with steroids -Follow clinical response. -Per chart review no previous evaluation or follow-up with pulmonologist for this condition. -Will benefit of PFTs and pulmonology evaluation as an outpatient.  Hypokalemia -Replete and reevaluate in a.m.   CAP (community acquired pneumonia) -Continue IV antibiotics and supportive care as mentioned above. -Continue to follow cultures results.   Atrial fibrillation (HCC) -Stable and rate controlled at the moment. -Patient condition appears to be paroxysmal in nature. -Continue Eliquis for secondary prevention -Continue beta-blocker low-dose amiodarone for rate control -Follow clinical response and telemetry monitoring.. -Follow electrolytes and replete and as needed with goal for potassium above 4 and magnesium above 2.   Diabetes mellitus without complication (HCC) -A1c 6.5 -Continue to hold oral hypoglycemic agents while inpatient -Continue sliding scale and Semglee -Follow CBGs fluctuation and adjust management as needed.   Hypertension -Stable for the most part -Continue home antihypertensive agents at this time. -Heart healthy/low-sodium diet discussed with patient.   DVT prophylaxis: Eliquis Code Status: DNR Family Communication: Significant other at bedside and daughter on phone 5/1 Disposition Plan:  Status is: Inpatient Remains inpatient appropriate because: Need for IV medications.  Consultants:  None  Procedures:  None  Antimicrobials:  Anti-infectives (From admission, onward)    Start     Dose/Rate Route Frequency Ordered Stop   06/24/22 1000  cefTRIAXone (ROCEPHIN) 2 g in sodium chloride 0.9 %  100 mL IVPB        2 g 200 mL/hr over 30 Minutes Intravenous Every 24 hours 06/13/2022 1408 06/29/22 0959   06/24/22 1000  azithromycin (ZITHROMAX)  500 mg in sodium chloride 0.9 % 250 mL IVPB        500 mg 250 mL/hr over 60 Minutes Intravenous Every 24 hours 06/07/2022 1408 06/29/22 0959   06/12/2022 1345  cefTRIAXone (ROCEPHIN) 1 g in sodium chloride 0.9 % 100 mL IVPB        1 g 200 mL/hr over 30 Minutes Intravenous  Once 06/24/2022 1335 06/17/2022 1424   06/17/2022 1345  azithromycin (ZITHROMAX) 500 mg in sodium chloride 0.9 % 250 mL IVPB        500 mg 250 mL/hr over 60 Minutes Intravenous  Once 05/29/2022 1335 06/05/2022 1547       Subjective: Patient seen and evaluated today with no new acute complaints or concerns. No acute concerns or events noted overnight.  He states that his breathing is improving, but he still continues to have high oxygen requirements.  Objective: Vitals:   06/25/22 0800 06/25/22 0934 06/25/22 1048 06/25/22 1135  BP: (!) 106/57 126/68 122/71   Pulse: 74 77 70   Resp: (!) 30  (!) 26   Temp:    (!) 97.4 F (36.3 C)  TempSrc:    Axillary  SpO2: (!) 88%  95%   Weight:      Height:        Intake/Output Summary (Last 24 hours) at 06/25/2022 1241 Last data filed at 06/25/2022 0905 Gross per 24 hour  Intake 600 ml  Output 750 ml  Net -150 ml   Filed Weights   05/28/2022 1633 06/24/22 0407 06/25/22 0500  Weight: 81.3 kg 80.6 kg 81.6 kg    Examination:  General exam: Appears calm and comfortable  Respiratory system: Clear to auscultation. Respiratory effort normal.  On high flow nasal cannula 35 L. Cardiovascular system: S1 & S2 heard, RRR.  Gastrointestinal system: Abdomen is soft Central nervous system: Alert and awake Extremities: No edema Skin: No significant lesions noted Psychiatry: Flat affect.    Data Reviewed: I have personally reviewed following labs and imaging studies  CBC: Recent Labs  Lab 06/05/2022 1111 06/24/22 0511  WBC 17.6* 15.5*  HGB  12.6* 10.7*  HCT 38.4* 32.6*  MCV 98.2 98.2  PLT 229 199   Basic Metabolic Panel: Recent Labs  Lab 06/22/2022 1111 06/08/2022 1202 06/01/2022 1546 06/24/22 0511 06/25/22 0507  NA 132*  --   --  134* 135  K 3.2*  --   --  3.3* 3.6  CL 99  --   --  102 105  CO2 19*  --   --  20* 22  GLUCOSE 149*  --   --  287* 249*  BUN 12  --   --  21 30*  CREATININE 0.99  --   --  1.04 1.01  CALCIUM 8.5*  --   --  8.1* 8.2*  MG  --  1.7  --   --  2.1  PHOS  --   --  3.0  --   --    GFR: Estimated Creatinine Clearance: 64 mL/min (by C-G formula based on SCr of 1.01 mg/dL). Liver Function Tests: Recent Labs  Lab 05/29/2022 1202  AST 32  ALT 14  ALKPHOS 47  BILITOT 1.3*  PROT 7.7  ALBUMIN 3.1*   No results for input(s): "LIPASE", "AMYLASE" in the last 168 hours. No results for input(s): "AMMONIA" in the last 168 hours. Coagulation Profile: Recent Labs  Lab 06/07/2022 1111  INR 1.4*   Cardiac Enzymes: No results for input(s): "CKTOTAL", "CKMB", "CKMBINDEX", "  TROPONINI" in the last 168 hours. BNP (last 3 results) No results for input(s): "PROBNP" in the last 8760 hours. HbA1C: Recent Labs    06/02/2022 1546  HGBA1C 6.5*   CBG: Recent Labs  Lab 06/24/22 1111 06/24/22 1646 06/24/22 2141 06/25/22 0715 06/25/22 1121  GLUCAP 293* 193* 223* 244* 268*   Lipid Profile: No results for input(s): "CHOL", "HDL", "LDLCALC", "TRIG", "CHOLHDL", "LDLDIRECT" in the last 72 hours. Thyroid Function Tests: Recent Labs    06/11/2022 1546 06/25/22 0830  TSH 5.391*  --   FREET4  --  1.22*   Anemia Panel: No results for input(s): "VITAMINB12", "FOLATE", "FERRITIN", "TIBC", "IRON", "RETICCTPCT" in the last 72 hours. Sepsis Labs: No results for input(s): "PROCALCITON", "LATICACIDVEN" in the last 168 hours.  Recent Results (from the past 240 hour(s))  Resp panel by RT-PCR (RSV, Flu A&B, Covid) Anterior Nasal Swab     Status: None   Collection Time: 06/18/2022 11:44 AM   Specimen: Anterior Nasal  Swab  Result Value Ref Range Status   SARS Coronavirus 2 by RT PCR NEGATIVE NEGATIVE Final    Comment: (NOTE) SARS-CoV-2 target nucleic acids are NOT DETECTED.  The SARS-CoV-2 RNA is generally detectable in upper respiratory specimens during the acute phase of infection. The lowest concentration of SARS-CoV-2 viral copies this assay can detect is 138 copies/mL. A negative result does not preclude SARS-Cov-2 infection and should not be used as the sole basis for treatment or other patient management decisions. A negative result may occur with  improper specimen collection/handling, submission of specimen other than nasopharyngeal swab, presence of viral mutation(s) within the areas targeted by this assay, and inadequate number of viral copies(<138 copies/mL). A negative result must be combined with clinical observations, patient history, and epidemiological information. The expected result is Negative.  Fact Sheet for Patients:  BloggerCourse.com  Fact Sheet for Healthcare Providers:  SeriousBroker.it  This test is no t yet approved or cleared by the [place] FDA and  has been authorized for detection and/or diagnosis of SARS-CoV-2 by FDA under an Emergency Use Authorization (EUA). This EUA will remain  in effect (meaning this test can be used) for the duration of the COVID-19 declaration under Section 564(b)(1) of the Act, 21 U.S.C.section 360bbb-3(b)(1), unless the authorization is terminated  or revoked sooner.       Influenza A by PCR NEGATIVE NEGATIVE Final   Influenza B by PCR NEGATIVE NEGATIVE Final    Comment: (NOTE) The Xpert Xpress SARS-CoV-2/FLU/RSV plus assay is intended as an aid in the diagnosis of influenza from Nasopharyngeal swab specimens and should not be used as a sole basis for treatment. Nasal washings and aspirates are unacceptable for Xpert Xpress SARS-CoV-2/FLU/RSV testing.  Fact Sheet for  Patients: BloggerCourse.com  Fact Sheet for Healthcare Providers: SeriousBroker.it  This test is not yet approved or cleared by the [place] FDA and has been authorized for detection and/or diagnosis of SARS-CoV-2 by FDA under an Emergency Use Authorization (EUA). This EUA will remain in effect (meaning this test can be used) for the duration of the COVID-19 declaration under Section 564(b)(1) of the Act, 21 U.S.C. section 360bbb-3(b)(1), unless the authorization is terminated or revoked.     Resp Syncytial Virus by PCR NEGATIVE NEGATIVE Final    Comment: (NOTE) Fact Sheet for Patients: BloggerCourse.com  Fact Sheet for Healthcare Providers: SeriousBroker.it  This test is not yet approved or cleared by the [place] FDA and has been authorized for detection and/or diagnosis of SARS-CoV-2 by  FDA under an Emergency Use Authorization (EUA). This EUA will remain in effect (meaning this test can be used) for the duration of the COVID-19 declaration under Section 564(b)(1) of the Act, 21 U.S.C. section 360bbb-3(b)(1), unless the authorization is terminated or revoked.  Performed at [facility], [address], [place], [place] [address]   Culture, blood (Routine X 2) w Reflex to ID Panel     Status: None (Preliminary result)   Collection Time: 06/11/2022  3:46 PM   Specimen: BLOOD LEFT ARM  Result Value Ref Range Status   Specimen Description BLOOD LEFT ARM  Final   Special Requests   Final    BOTTLES DRAWN AEROBIC AND ANAEROBIC Blood Culture adequate volume   Culture   Final    NO GROWTH 2 DAYS Performed at [facility], [address] [place], [place] [address]    Report Status PENDING  Incomplete  Culture, blood (Routine X 2) w Reflex to ID Panel     Status: None (Preliminary result)   Collection Time: 06/04/2022  3:46 PM   Specimen: BLOOD RIGHT ARM  Result Value Ref  Range Status   Specimen Description BLOOD RIGHT ARM AEROBIC BOTTLE ONLY  Final   Special Requests   Final    Blood Culture results may not be optimal due to an inadequate volume of blood received in culture bottles   Culture   Final    NO GROWTH 2 DAYS Performed at [facility], [name], [place], [place] [address]    Report Status PENDING  Incomplete  MRSA Next Gen by PCR, Nasal     Status: None   Collection Time: 06/12/2022  4:37 PM   Specimen: Nasal Mucosa; Nasal Swab  Result Value Ref Range Status   MRSA by PCR Next Gen NOT DETECTED NOT DETECTED Final    Comment: (NOTE) The GeneXpert MRSA Assay (FDA approved for NASAL specimens only), is one component of a comprehensive MRSA colonization surveillance program. It is not intended to diagnose MRSA infection nor to guide or monitor treatment for MRSA infections. Test performance is not FDA approved in patients less than 62 years old. Performed at [facility], [address] [place], [place] [address]          Radiology Studies: DG CHEST PORT 1 VIEW  Result Date: 06/25/2022 CLINICAL DATA:  Acute respiratory failure EXAM: PORTABLE CHEST 1 VIEW COMPARISON:  Chest x-ray dated June 23, 2022 FINDINGS: Cardiac and mediastinal contours are unchanged. Unchanged bilateral heterogeneous lung opacities. No evidence of pleural effusion or pneumothorax. IMPRESSION: Unchanged bilateral heterogeneous lung opacities, likely pulmonary edema or infection superimposed on chronic interstitial lung disease. Electronically Signed   By: Allegra Lai M.D.   On: 06/25/2022 08:22   CT Angio Chest PE W and/or Wo Contrast  Result Date: 06/24/2022 CLINICAL DATA:  Chest pain and shortness of breath. EXAM: CT ANGIOGRAPHY CHEST WITH CONTRAST TECHNIQUE: Multidetector CT imaging of the chest was performed using the standard protocol during bolus administration of intravenous contrast. Multiplanar CT image reconstructions and MIPs were obtained to evaluate  the vascular anatomy. RADIATION DOSE REDUCTION: This exam was performed according to the departmental dose-optimization program which includes automated exposure control, adjustment of the mA and/or kV according to patient size and/or use of iterative reconstruction technique. CONTRAST:  75mL OMNIPAQUE IOHEXOL 350 MG/ML SOLN COMPARISON:  Prior CTA of the chest on 12/06/2019 FINDINGS: Cardiovascular: The pulmonary arteries are well opacified to the subsegmental level. There is no evidence of acute or  chronic pulmonary embolism. Central pulmonary arteries are dilated with the main pulmonary artery measuring up to approximately 3.8 cm. Atherosclerosis of the thoracic aorta present without aneurysmal disease. Coronary atherosclerosis present. The heart size is top normal. No pericardial fluid identified. Calcified aortic valve and mitral valve annulus. Mediastinum/Nodes: Mildly prominent scattered mediastinal and bilateral hilar lymph nodes. Largest mediastinal lymph node is a roughly 14 mm subcarinal node. Other scattered right paratracheal and AP window nodes present. Bilateral mildly prominent hilar lymph node tissue with the largest right hilar lymph node measuring approximately 12 mm in short axis. Small hiatal hernia. Lungs/Pleura: Superimposed on previously visualized chronic interstitial lung disease is new widespread ground-glass airspace opacity throughout both lungs, slightly worse in the right lung. There also likely is some worsening of interstitial disease with potentially some fibrotic disease now in the periphery of both lungs. No associated pneumothorax, pleural fluid or significant bronchiectasis. No discrete pulmonary masses are identified. Upper Abdomen: Status post cholecystectomy. No acute findings in the visualized upper abdomen. Musculoskeletal: No chest wall abnormality. No acute or significant osseous findings. Review of the MIP images confirms the above findings. IMPRESSION: 1. No evidence of  acute or chronic pulmonary embolism. 2. Dilated central pulmonary arteries consistent with pulmonary hypertension. 3. Superimposed on previously visualized mild chronic interstitial lung disease is new widespread ground-glass airspace opacity throughout both lungs, slightly worse in the right lung. There also likely is some worsening of interstitial disease with potentially some fibrotic disease now in the periphery of both lungs. Findings are suggestive of acute pneumonia superimposed on chronic interstitial lung disease and pulmonary fibrosis. 4. Mildly prominent mediastinal and bilateral hilar lymph nodes are felt to be likely reactive given the significant acute bilateral pulmonary airspace disease. 5. Coronary atherosclerosis. 6. Aortic atherosclerosis. 7. Small hiatal hernia. Aortic Atherosclerosis (ICD10-I70.0). Electronically Signed   By: Irish Lack M.D.   On: 06/01/2022 13:11        Scheduled Meds:  amiodarone  100 mg Oral Daily   apixaban  5 mg Oral BID   budesonide (PULMICORT) nebulizer solution  0.5 mg Nebulization BID   Chlorhexidine Gluconate Cloth  6 each Topical Daily   cholecalciferol  1,000 Units Oral QODAY   furosemide  20 mg Intravenous Daily   insulin aspart  0-5 Units Subcutaneous QHS   insulin aspart  0-9 Units Subcutaneous TID WC   insulin glargine-yfgn  7 Units Subcutaneous QHS   ipratropium-albuterol  3 mL Nebulization Q6H   levothyroxine  25 mcg Oral QHS   methylPREDNISolone (SOLU-MEDROL) injection  40 mg Intravenous Q12H   metoprolol succinate  100 mg Oral Daily   pantoprazole  40 mg Oral Daily   saccharomyces boulardii  250 mg Oral BID   sodium chloride flush  3 mL Intravenous Q12H   Continuous Infusions:  sodium chloride     azithromycin 500 mg (06/25/22 0931)   cefTRIAXone (ROCEPHIN)  IV 2 g (06/25/22 0932)     LOS: 2 days    Time spent: 35 minutes    Safina Huard Hoover Brunette, DO [facility]  If 7PM-7AM, please contact  night-coverage [email or web address] 06/25/2022, 12:41 PM

## 2022-06-25 NOTE — Progress Notes (Signed)
RT called because patient not tolerating BIPAP. Tried patient on HHFNC 40L @ 100% but his RR increased to 46 bpm and O2 sat dropped to 84%. Placed patient back on BIPAP with previous settings. Explained to patient that we had to keep him on machine for now because he could not continue to breath that fast and have an O2 sat that low.

## 2022-06-25 NOTE — Progress Notes (Signed)
   06/25/22 1145  Spiritual Encounters  Type of Visit Initial  Care provided to: Pt and family  Conversation partners present during encounter Nurse  Referral source Nurse (RN/NT/LPN)  Reason for visit Advance directives  OnCall Visit No   Chaplain responded to a call for advanced directive education. The patient struggled to express himself but I believe it was do to the breathing equipment. No family was present during this visit I told he I would come back. During my conversation with his nurse, Greggory Stallion, it was mentioned that the family was the primary ones interested in the paperwork. I connected with one member of the family, Diane. She will give the paperwork to his daugher to fill out and I advised her that when the paperwork is complete to advise their nurse and we will proceed with the next steps.  Diane was very distraught during our conversation over the heath of her loved one, Andrew Humphrey.   [address] [place]  [phone]

## 2022-06-25 NOTE — Inpatient Diabetes Management (Signed)
Inpatient Diabetes Program Recommendations  AACE/ADA: New Consensus Statement on Inpatient Glycemic Control   Target Ranges:  Prepandial:   less than 140 mg/dL      Peak postprandial:   less than 180 mg/dL (1-2 hours)      Critically ill patients:  140 - 180 mg/dL    Latest Reference Range & Units 06/24/22 07:24 06/24/22 11:11 06/24/22 16:46 06/24/22 21:41 06/25/22 07:15  Glucose-Capillary 70 - 99 mg/dL 161 (H) 096 (H) 045 (H) 223 (H) 244 (H)   Review of Glycemic Control  Diabetes history: DM2 Outpatient Diabetes medications:  Glipizide XL 10 mg QHS, Januvia 25 mg daily, Metformin 1000 mg QHS Current orders for Inpatient glycemic control: Semglee 7 units QHS, Novolog 0-9 units TID with meals, Novolog 0-5 units QHS; Solumedrol 40 mg Q12H  Inpatient Diabetes Program Recommendations:    Insulin: If steroids are continued, please consider increasing Semglee to 10 units QHS and adding Novolog 3 units TID with meals for meal coverage if patient eats at least 50% of meals.  Thanks, Orlando Penner, RN, MSN, CDCES Diabetes Coordinator Inpatient Diabetes Program [phone] (Team Pager from 8am to 5pm)

## 2022-06-25 NOTE — Progress Notes (Signed)
Patient taken off of BIPAP and placed back on HHFNC at 40L and 100%. Patients sats 92 at this time. Will continue to monitor.

## 2022-06-25 DEATH — deceased

## 2022-06-26 DIAGNOSIS — J9601 Acute respiratory failure with hypoxia: Secondary | ICD-10-CM | POA: Diagnosis not present

## 2022-06-26 LAB — CULTURE, BLOOD (ROUTINE X 2): Culture: NO GROWTH

## 2022-06-26 LAB — BLOOD GAS, ARTERIAL
Acid-base deficit: 4.5 mmol/L — ABNORMAL HIGH (ref 0.0–2.0)
Bicarbonate: 21.7 mmol/L (ref 20.0–28.0)
Drawn by: 38235
O2 Saturation: 88.4 %
Patient temperature: 37
pCO2 arterial: 43 mmHg (ref 32–48)
pH, Arterial: 7.31 — ABNORMAL LOW (ref 7.35–7.45)
pO2, Arterial: 64 mmHg — ABNORMAL LOW (ref 83–108)

## 2022-06-26 MED ORDER — MORPHINE BOLUS VIA INFUSION: 5.0000 mg | INTRAVENOUS | Status: DC | PRN

## 2022-06-26 MED ORDER — MORPHINE 100MG IN NS 100ML (1MG/ML) PREMIX INFUSION
0.0000 mg/h | INTRAVENOUS | Status: DC
  Filled 2022-06-26: qty 100

## 2022-06-26 MED ORDER — POLYVINYL ALCOHOL 1.4 % OP SOLN: 1.0000 [drp] | Freq: Four times a day (QID) | OPHTHALMIC | Status: DC | PRN

## 2022-06-26 MED ORDER — SODIUM CHLORIDE 0.9 % IV SOLN: INTRAVENOUS | Status: DC

## 2022-06-26 MED ORDER — HALOPERIDOL LACTATE 5 MG/ML IJ SOLN: 2.5000 mg | INTRAMUSCULAR | Status: DC | PRN

## 2022-06-27 LAB — CULTURE, BLOOD (ROUTINE X 2)

## 2022-07-26 NOTE — Death Summary Note (Signed)
DEATH SUMMARY   Patient Details  Name: Andrew Humphrey MRN: [id number] DOB: 1940-04-09 NWG:[id number], Andrew Peon, PA-C Admission/Discharge Information   Admit Date:  Jul 23, 2022  Date of Death: Date of Death: 2022/07/26  Time of Death: Time of Death: 0217  Length of Stay: 3   Principle Cause of death: Acute hypoxemic respiratory failure due to community-acquired pneumonia in the setting of interstitial lung disease.  Hospital Diagnoses: Principal Problem:   Acute hypoxic respiratory failure (HCC) Active Problems:   Hypertension   Diabetes mellitus without complication (HCC)   Atrial fibrillation (HCC)   Hypokalemia   CAP (community acquired pneumonia)   Pulmonary fibrosis (HCC)   Hypothyroidism   GERD (gastroesophageal reflux disease)   Hospital Course: Andrew Humphrey is a 82 y.o. male with medical history significant of hypertension, history of atrial fibrillation (on chronic Eliquis), type 2 diabetes without complications, history of GI bleed/GERD and hypothyroidism; who presented to the hospital secondary to shortness of breath and coughing spells.  Patient was admitted with acute hypoxemic respiratory failure with oxygen saturation in the 60th percentile.  He is noted to have community-acquired pneumonia superimposed on interstitial lung disease and struggling with significant amounts of hypoxemia.  He was hospitalized for several days on IV antibiotics as well as IV steroids and continuous breathing treatments, but unfortunately was not improving much clinically.  He continued to have significantly elevated oxygen requirements with high flow nasal cannula as well as BiPAP.  Discussion was had with family as well as patient who stated that he would not want aggressive interventions such as intubation/ventilation and ultimately it was decided that he would be DNR.  He continued to struggle into the evening of 5/1 with requirements for comfort medications such as morphine and Ativan.  He  was subsequently transitioned to full comfort care by 2 AM on Jul 26, 2022 and passed shortly thereafter at 2:17 AM.  Procedures: None  Consultations: None  The results of significant diagnostics from this hospitalization (including imaging, microbiology, ancillary and laboratory) are listed below for reference.   Significant Diagnostic Studies: DG CHEST PORT 1 VIEW  Result Date: 06/25/2022 CLINICAL DATA:  Acute respiratory failure EXAM: PORTABLE CHEST 1 VIEW COMPARISON:  Chest x-ray dated 07/23/22 FINDINGS: Cardiac and mediastinal contours are unchanged. Unchanged bilateral heterogeneous lung opacities. No evidence of pleural effusion or pneumothorax. IMPRESSION: Unchanged bilateral heterogeneous lung opacities, likely pulmonary edema or infection superimposed on chronic interstitial lung disease. Electronically Signed   By: Allegra Lai M.D.   On: 06/25/2022 08:22   CT Angio Chest PE W and/or Wo Contrast  Result Date: 07-23-2022 CLINICAL DATA:  Chest pain and shortness of breath. EXAM: CT ANGIOGRAPHY CHEST WITH CONTRAST TECHNIQUE: Multidetector CT imaging of the chest was performed using the standard protocol during bolus administration of intravenous contrast. Multiplanar CT image reconstructions and MIPs were obtained to evaluate the vascular anatomy. RADIATION DOSE REDUCTION: This exam was performed according to the departmental dose-optimization program which includes automated exposure control, adjustment of the mA and/or kV according to patient size and/or use of iterative reconstruction technique. CONTRAST:  75mL OMNIPAQUE IOHEXOL 350 MG/ML SOLN COMPARISON:  Prior CTA of the chest on 12/06/2019 FINDINGS: Cardiovascular: The pulmonary arteries are well opacified to the subsegmental level. There is no evidence of acute or chronic pulmonary embolism. Central pulmonary arteries are dilated with the main pulmonary artery measuring up to approximately 3.8 cm. Atherosclerosis of the thoracic aorta  present without aneurysmal disease. Coronary atherosclerosis present. The heart size is  top normal. No pericardial fluid identified. Calcified aortic valve and mitral valve annulus. Mediastinum/Nodes: Mildly prominent scattered mediastinal and bilateral hilar lymph nodes. Largest mediastinal lymph node is a roughly 14 mm subcarinal node. Other scattered right paratracheal and AP window nodes present. Bilateral mildly prominent hilar lymph node tissue with the largest right hilar lymph node measuring approximately 12 mm in short axis. Small hiatal hernia. Lungs/Pleura: Superimposed on previously visualized chronic interstitial lung disease is new widespread ground-glass airspace opacity throughout both lungs, slightly worse in the right lung. There also likely is some worsening of interstitial disease with potentially some fibrotic disease now in the periphery of both lungs. No associated pneumothorax, pleural fluid or significant bronchiectasis. No discrete pulmonary masses are identified. Upper Abdomen: Status post cholecystectomy. No acute findings in the visualized upper abdomen. Musculoskeletal: No chest wall abnormality. No acute or significant osseous findings. Review of the MIP images confirms the above findings. IMPRESSION: 1. No evidence of acute or chronic pulmonary embolism. 2. Dilated central pulmonary arteries consistent with pulmonary hypertension. 3. Superimposed on previously visualized mild chronic interstitial lung disease is new widespread ground-glass airspace opacity throughout both lungs, slightly worse in the right lung. There also likely is some worsening of interstitial disease with potentially some fibrotic disease now in the periphery of both lungs. Findings are suggestive of acute pneumonia superimposed on chronic interstitial lung disease and pulmonary fibrosis. 4. Mildly prominent mediastinal and bilateral hilar lymph nodes are felt to be likely reactive given the significant acute  bilateral pulmonary airspace disease. 5. Coronary atherosclerosis. 6. Aortic atherosclerosis. 7. Small hiatal hernia. Aortic Atherosclerosis (ICD10-I70.0). Electronically Signed   By: Irish Lack M.D.   On: 06/03/2022 13:11   DG Chest Port 1 View  Result Date: 06/05/2022 CLINICAL DATA:  Shortness of breath EXAM: PORTABLE CHEST 1 VIEW COMPARISON:  X-ray 07/23/2021 FINDINGS: Enlarged cardiopericardial silhouette. No pneumothorax or effusion. Increasing interstitial and parenchymal lung opacities bilaterally. An acute infiltrative process possible. Recommend follow-up. Overlapping cardiac leads. IMPRESSION: Decreasing interstitial and parenchymal opacities compared to the prior. An acute process is possible. Underlying chronic changes are also in the differential with the prior x-ray. Recommend short follow-up Electronically Signed   By: Karen Kays M.D.   On: 06/07/2022 11:50    Microbiology: Recent Results (from the past 240 hour(s))  Resp panel by RT-PCR (RSV, Flu A&B, Covid) Anterior Nasal Swab     Status: None   Collection Time: 05/29/2022 11:44 AM   Specimen: Anterior Nasal Swab  Result Value Ref Range Status   SARS Coronavirus 2 by RT PCR NEGATIVE NEGATIVE Final    Comment: (NOTE) SARS-CoV-2 target nucleic acids are NOT DETECTED.  The SARS-CoV-2 RNA is generally detectable in upper respiratory specimens during the acute phase of infection. The lowest concentration of SARS-CoV-2 viral copies this assay can detect is 138 copies/mL. A negative result does not preclude SARS-Cov-2 infection and should not be used as the sole basis for treatment or other patient management decisions. A negative result may occur with  improper specimen collection/handling, submission of specimen other than nasopharyngeal swab, presence of viral mutation(s) within the areas targeted by this assay, and inadequate number of viral copies(<138 copies/mL). A negative result must be combined with clinical  observations, patient history, and epidemiological information. The expected result is Negative.  Fact Sheet for Patients:  BloggerCourse.com  Fact Sheet for Healthcare Providers:  SeriousBroker.it  This test is no t yet approved or cleared by the [place] FDA and  has been  authorized for detection and/or diagnosis of SARS-CoV-2 by FDA under an Emergency Use Authorization (EUA). This EUA will remain  in effect (meaning this test can be used) for the duration of the COVID-19 declaration under Section 564(b)(1) of the Act, 21 U.S.C.section 360bbb-3(b)(1), unless the authorization is terminated  or revoked sooner.       Influenza A by PCR NEGATIVE NEGATIVE Final   Influenza B by PCR NEGATIVE NEGATIVE Final    Comment: (NOTE) The Xpert Xpress SARS-CoV-2/FLU/RSV plus assay is intended as an aid in the diagnosis of influenza from Nasopharyngeal swab specimens and should not be used as a sole basis for treatment. Nasal washings and aspirates are unacceptable for Xpert Xpress SARS-CoV-2/FLU/RSV testing.  Fact Sheet for Patients: BloggerCourse.com  Fact Sheet for Healthcare Providers: SeriousBroker.it  This test is not yet approved or cleared by the [place] FDA and has been authorized for detection and/or diagnosis of SARS-CoV-2 by FDA under an Emergency Use Authorization (EUA). This EUA will remain in effect (meaning this test can be used) for the duration of the COVID-19 declaration under Section 564(b)(1) of the Act, 21 U.S.C. section 360bbb-3(b)(1), unless the authorization is terminated or revoked.     Resp Syncytial Virus by PCR NEGATIVE NEGATIVE Final    Comment: (NOTE) Fact Sheet for Patients: BloggerCourse.com  Fact Sheet for Healthcare Providers: SeriousBroker.it  This test is not yet approved or cleared by  the [place] FDA and has been authorized for detection and/or diagnosis of SARS-CoV-2 by FDA under an Emergency Use Authorization (EUA). This EUA will remain in effect (meaning this test can be used) for the duration of the COVID-19 declaration under Section 564(b)(1) of the Act, 21 U.S.C. section 360bbb-3(b)(1), unless the authorization is terminated or revoked.  Performed at [facility], [address], [place], [place] [address]   Culture, blood (Routine X 2) w Reflex to ID Panel     Status: None (Preliminary result)   Collection Time: 05/27/2022  3:46 PM   Specimen: BLOOD LEFT ARM  Result Value Ref Range Status   Specimen Description BLOOD LEFT ARM  Final   Special Requests   Final    BOTTLES DRAWN AEROBIC AND ANAEROBIC Blood Culture adequate volume   Culture   Final    NO GROWTH 2 DAYS Performed at [facility], [address] [place], [place] [address]    Report Status PENDING  Incomplete  Culture, blood (Routine X 2) w Reflex to ID Panel     Status: None (Preliminary result)   Collection Time: 06/09/2022  3:46 PM   Specimen: BLOOD RIGHT ARM  Result Value Ref Range Status   Specimen Description BLOOD RIGHT ARM AEROBIC BOTTLE ONLY  Final   Special Requests   Final    Blood Culture results may not be optimal due to an inadequate volume of blood received in culture bottles   Culture   Final    NO GROWTH 2 DAYS Performed at [facility], [address] [place], [place] [address]    Report Status PENDING  Incomplete  MRSA Next Gen by PCR, Nasal     Status: None   Collection Time: 06/13/2022  4:37 PM   Specimen: Nasal Mucosa; Nasal Swab  Result Value Ref Range Status   MRSA by PCR Next Gen NOT DETECTED NOT DETECTED Final    Comment: (NOTE) The GeneXpert MRSA Assay (FDA approved for NASAL specimens only), is one component of a comprehensive MRSA colonization surveillance program. It is not intended to  diagnose MRSA infection nor to guide or monitor treatment for MRSA  infections. Test performance is not FDA approved in patients less than 67 years old. Performed at [facility], [address] [place], [place] [address]     Time spent: 30 minutes  Signed: Erick Blinks, DO 06/28/2022

## 2022-07-26 NOTE — Progress Notes (Addendum)
Patient is restless, tachypneic, fighting off the BIPAP, tried taking off the BIPAP, and put him on Heated High flow [place] , sats dropped to low 80s, RR increased to 50-60s, put him back on BIPAP, PRN comforting meds given, MD and RT made aware, despite PRN morphine and Ativan, patient is restless, and is agitated, did the EKG, and Blood gas, Family made aware about the severity of the problem and about considering for the comfort care, Family visited and decided to do the comfort care, Dr. Thomes Dinning put in the orders for comfort care, removed the BIPAP mask at 2AM, patient passed away at 2:17 AM, Family at bedside, MD and [facility] ,made aware, will continue with the proceedings for post-mortem care. [facility] notified, patient not fit for donor,  patient placement notified, security called and body taken to morgue

## 2022-07-26 NOTE — Progress Notes (Signed)
Progress note  RN called due to patient being agitated and trying to take BiPAP off. ABG was done and showed pH 7.31, pCO2 43, pO2 64, bicarb 21.7.  O2 saturation 88.4% on FiO2 100% At bedside, patient was using accessory muscle and was struggling with breathing.  Patient is DNR and option for comfort care was already discussed with family earlier in the shift. Family now at bedside and decided to make patient comfort care.  Daughter (POA-respiratory therapist) understood patient's condition and decided to make patient comfort care.  Comfort care order was placed.

## 2022-07-26 DEATH — deceased
